# Patient Record
Sex: Female | Born: 1960 | Race: Black or African American | Hispanic: No | Marital: Single | Smoking: Never smoker
Health system: Southern US, Community
[De-identification: ages and names within clinical notes are randomized; demographics above are authoritative.]

## PROBLEM LIST (undated history)

## (undated) DIAGNOSIS — R0600 Dyspnea, unspecified: Secondary | ICD-10-CM

## (undated) DIAGNOSIS — F419 Anxiety disorder, unspecified: Secondary | ICD-10-CM

## (undated) DIAGNOSIS — R519 Headache, unspecified: Secondary | ICD-10-CM

## (undated) DIAGNOSIS — E0590 Thyrotoxicosis, unspecified without thyrotoxic crisis or storm: Secondary | ICD-10-CM

## (undated) DIAGNOSIS — Z973 Presence of spectacles and contact lenses: Secondary | ICD-10-CM

## (undated) DIAGNOSIS — R112 Nausea with vomiting, unspecified: Secondary | ICD-10-CM

## (undated) DIAGNOSIS — K219 Gastro-esophageal reflux disease without esophagitis: Secondary | ICD-10-CM

## (undated) DIAGNOSIS — M1990 Unspecified osteoarthritis, unspecified site: Secondary | ICD-10-CM

## (undated) DIAGNOSIS — J189 Pneumonia, unspecified organism: Secondary | ICD-10-CM

## (undated) DIAGNOSIS — E119 Type 2 diabetes mellitus without complications: Secondary | ICD-10-CM

## (undated) DIAGNOSIS — F32A Depression, unspecified: Secondary | ICD-10-CM

## (undated) DIAGNOSIS — I499 Cardiac arrhythmia, unspecified: Secondary | ICD-10-CM

## (undated) DIAGNOSIS — N189 Chronic kidney disease, unspecified: Secondary | ICD-10-CM

## (undated) DIAGNOSIS — J302 Other seasonal allergic rhinitis: Secondary | ICD-10-CM

## (undated) DIAGNOSIS — F329 Major depressive disorder, single episode, unspecified: Secondary | ICD-10-CM

## (undated) DIAGNOSIS — Z9889 Other specified postprocedural states: Secondary | ICD-10-CM

## (undated) DIAGNOSIS — J40 Bronchitis, not specified as acute or chronic: Secondary | ICD-10-CM

## (undated) DIAGNOSIS — G8929 Other chronic pain: Secondary | ICD-10-CM

## (undated) DIAGNOSIS — Z972 Presence of dental prosthetic device (complete) (partial): Secondary | ICD-10-CM

## (undated) DIAGNOSIS — M797 Fibromyalgia: Secondary | ICD-10-CM

## (undated) DIAGNOSIS — E785 Hyperlipidemia, unspecified: Secondary | ICD-10-CM

## (undated) DIAGNOSIS — G4730 Sleep apnea, unspecified: Secondary | ICD-10-CM

## (undated) DIAGNOSIS — I10 Essential (primary) hypertension: Secondary | ICD-10-CM

## (undated) HISTORY — PX: WISDOM TOOTH EXTRACTION: SHX21

## (undated) HISTORY — PX: CHOLECYSTECTOMY: SHX55

## (undated) HISTORY — PX: UPPER GI ENDOSCOPY: SHX6162

## (undated) HISTORY — PX: TUBAL LIGATION: SHX77

## (undated) HISTORY — PX: ABDOMINAL HYSTERECTOMY: SHX81

## (undated) HISTORY — PX: COLONOSCOPY: SHX174

---

## 2009-06-10 ENCOUNTER — Emergency Department (HOSPITAL_BASED_OUTPATIENT_CLINIC_OR_DEPARTMENT_OTHER): Admission: EM | Admit: 2009-06-10 | Discharge: 2009-06-10 | Payer: Self-pay | Admitting: Emergency Medicine

## 2009-06-10 ENCOUNTER — Ambulatory Visit: Payer: Self-pay | Admitting: Interventional Radiology

## 2009-06-10 IMAGING — CR DG CLAVICLE*L*
2 series · 2 of 2 positions shown · non-contrast
Comparison: None.

CLINICAL DATA: Pain post fall

LEFT CLAVICLE - 2+ VIEWS

[w clavicle ap left *]
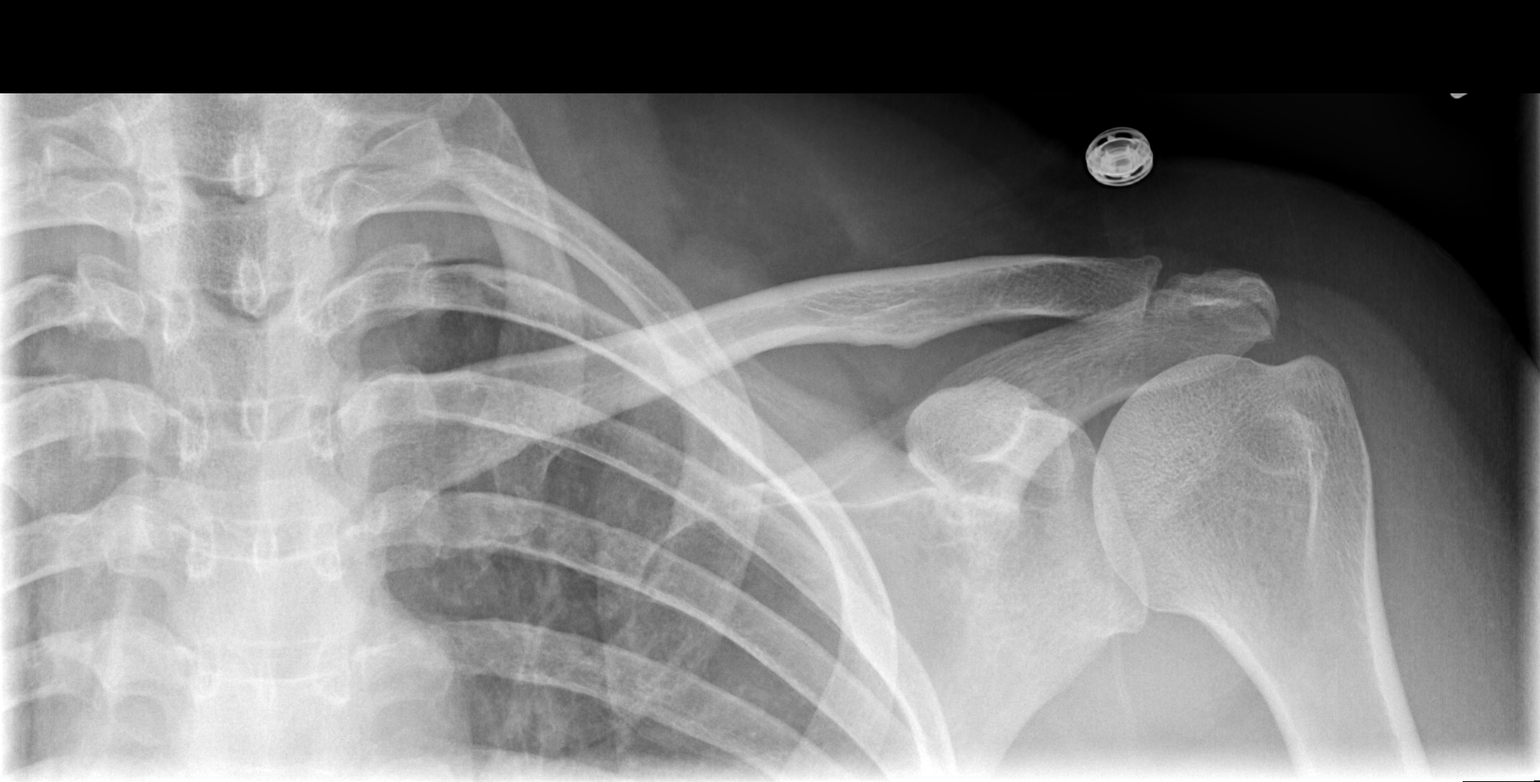

[w clavicle tangential left *]
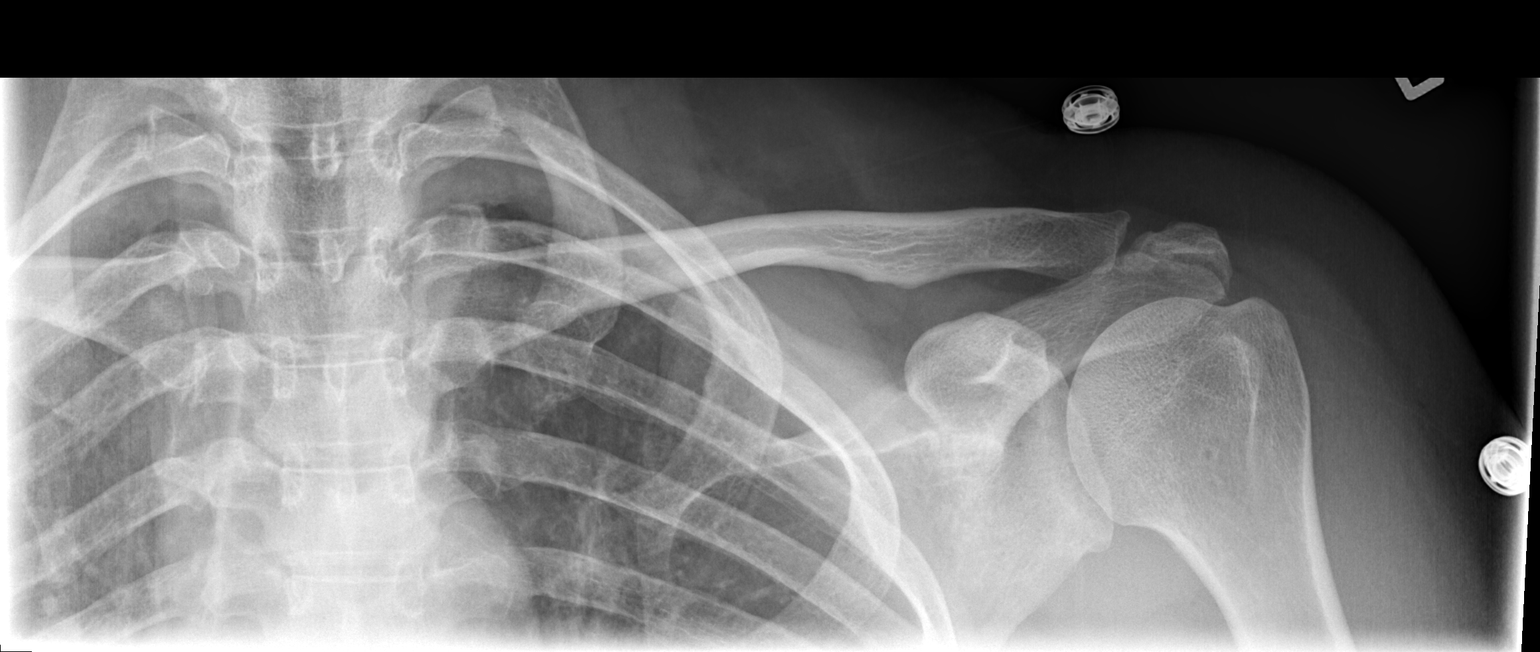

[2 of 2 positions shown; findings below may reference images not displayed]

FINDINGS: There is a linear lucency across the acromion in the
axial plane,   approximately 1 mm in width.  Clavicle is intact.
Glenohumeral articulation appears normal.  The acromioclavicular
space is normal.
IMPRESSION: 1.  Minimally-displaced fracture of the acromion versus ununited
apophysis.  Correlate with point tenderness.

## 2010-01-05 ENCOUNTER — Emergency Department (HOSPITAL_BASED_OUTPATIENT_CLINIC_OR_DEPARTMENT_OTHER)
Admission: EM | Admit: 2010-01-05 | Discharge: 2010-01-05 | Payer: Self-pay | Source: Home / Self Care | Admitting: Emergency Medicine

## 2010-04-01 LAB — WET PREP, GENITAL
Trich, Wet Prep: NONE SEEN
Yeast Wet Prep HPF POC: NONE SEEN

## 2010-04-01 LAB — URINALYSIS, ROUTINE W REFLEX MICROSCOPIC
Ketones, ur: NEGATIVE mg/dL
Nitrite: NEGATIVE
Specific Gravity, Urine: 1.042 — ABNORMAL HIGH (ref 1.005–1.030)

## 2010-04-01 LAB — GC/CHLAMYDIA PROBE AMP, GENITAL
Chlamydia, DNA Probe: NEGATIVE
GC Probe Amp, Genital: NEGATIVE

## 2011-01-21 HISTORY — PX: ANTERIOR (CYSTOCELE) AND POSTERIOR REPAIR (RECTOCELE) WITH XENFORM GRAFT AND SACROSPINOUS FIXATION: SHX6492

## 2011-03-24 ENCOUNTER — Encounter: Payer: Self-pay | Admitting: Family Medicine

## 2012-01-21 DIAGNOSIS — K759 Inflammatory liver disease, unspecified: Secondary | ICD-10-CM

## 2012-01-21 HISTORY — DX: Inflammatory liver disease, unspecified: K75.9

## 2016-01-21 HISTORY — PX: OTHER SURGICAL HISTORY: SHX169

## 2017-10-21 ENCOUNTER — Emergency Department (HOSPITAL_BASED_OUTPATIENT_CLINIC_OR_DEPARTMENT_OTHER): Payer: Medicare Other

## 2017-10-21 ENCOUNTER — Encounter: Payer: Self-pay | Admitting: Emergency Medicine

## 2017-10-21 ENCOUNTER — Other Ambulatory Visit: Payer: Self-pay

## 2017-10-21 ENCOUNTER — Emergency Department (HOSPITAL_BASED_OUTPATIENT_CLINIC_OR_DEPARTMENT_OTHER)
Admission: EM | Admit: 2017-10-21 | Discharge: 2017-10-21 | Disposition: A | Discharge status: Home / Self Care | Payer: Medicare Other | Attending: Emergency Medicine | Admitting: Emergency Medicine

## 2017-10-21 DIAGNOSIS — Z79899 Other long term (current) drug therapy: Secondary | ICD-10-CM | POA: Insufficient documentation

## 2017-10-21 DIAGNOSIS — E119 Type 2 diabetes mellitus without complications: Secondary | ICD-10-CM | POA: Insufficient documentation

## 2017-10-21 DIAGNOSIS — M79605 Pain in left leg: Secondary | ICD-10-CM | POA: Diagnosis not present

## 2017-10-21 DIAGNOSIS — I10 Essential (primary) hypertension: Secondary | ICD-10-CM | POA: Insufficient documentation

## 2017-10-21 HISTORY — DX: Depression, unspecified: F32.A

## 2017-10-21 HISTORY — DX: Type 2 diabetes mellitus without complications: E11.9

## 2017-10-21 HISTORY — DX: Essential (primary) hypertension: I10

## 2017-10-21 HISTORY — DX: Major depressive disorder, single episode, unspecified: F32.9

## 2017-10-21 IMAGING — DX DG HIP (WITH OR WITHOUT PELVIS) 2-3V*L*
3 series · 3 of 3 positions shown · non-contrast
Comparison: None.

CLINICAL DATA: Left hip pain, twisting injury, difficulty bearing
weight

EXAM:
DG HIP (WITH OR WITHOUT PELVIS) 2-3V LEFT

[pelvis ap]
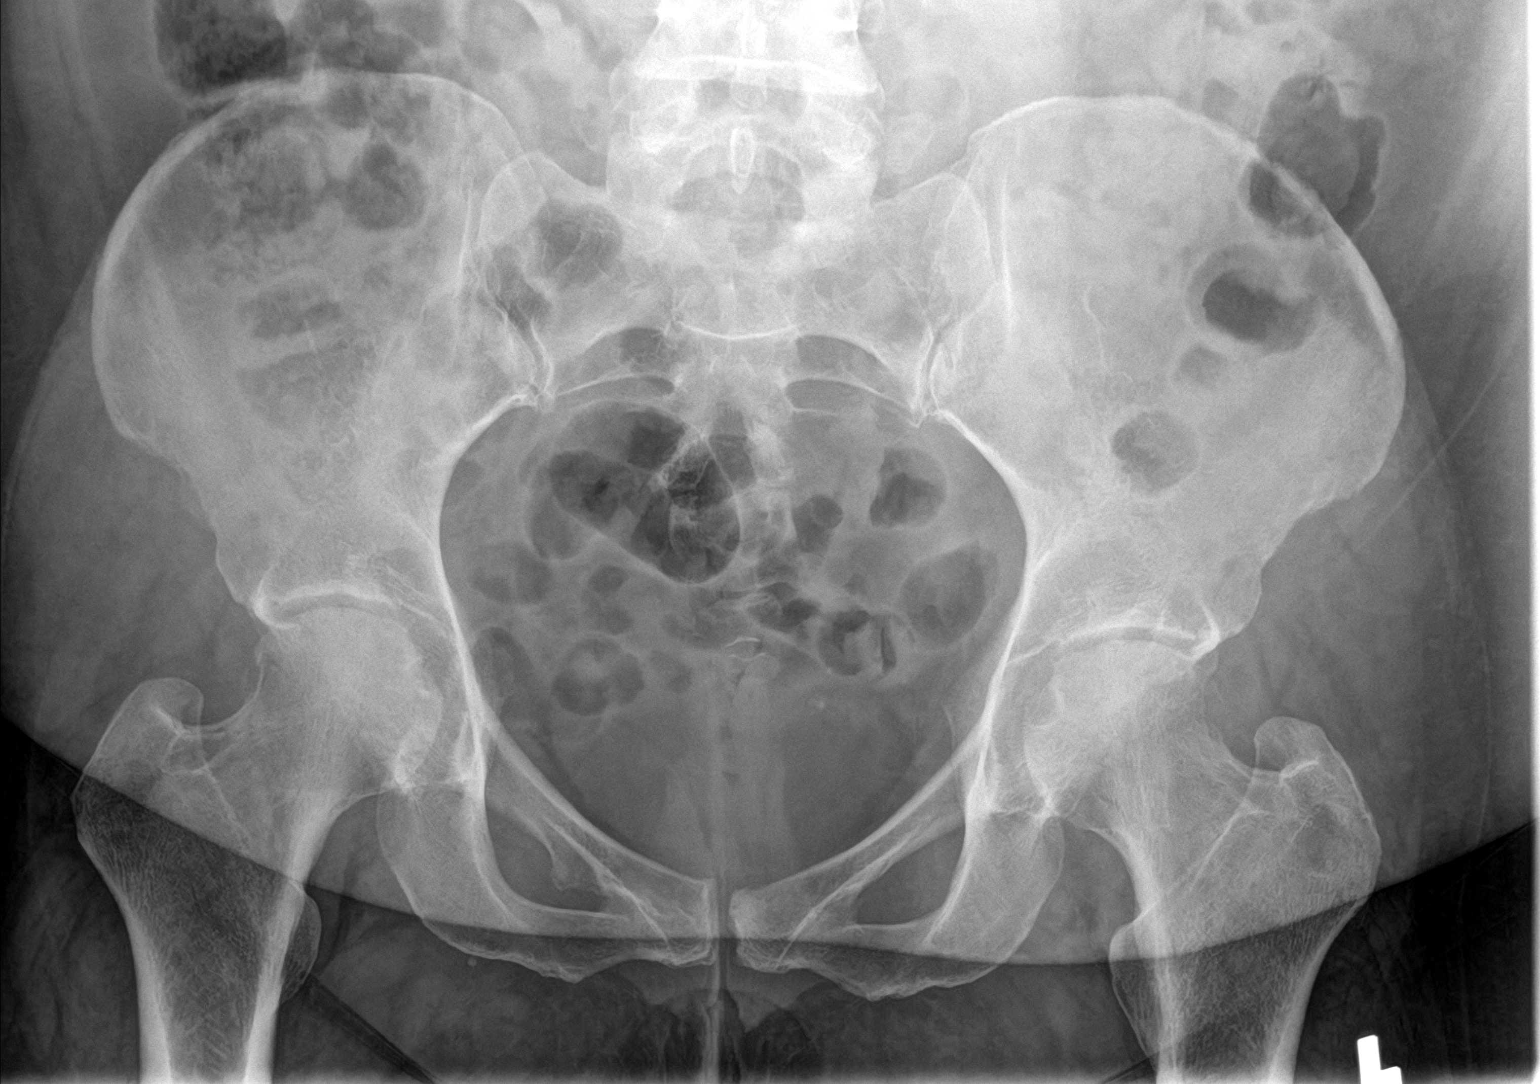

[hip ap]
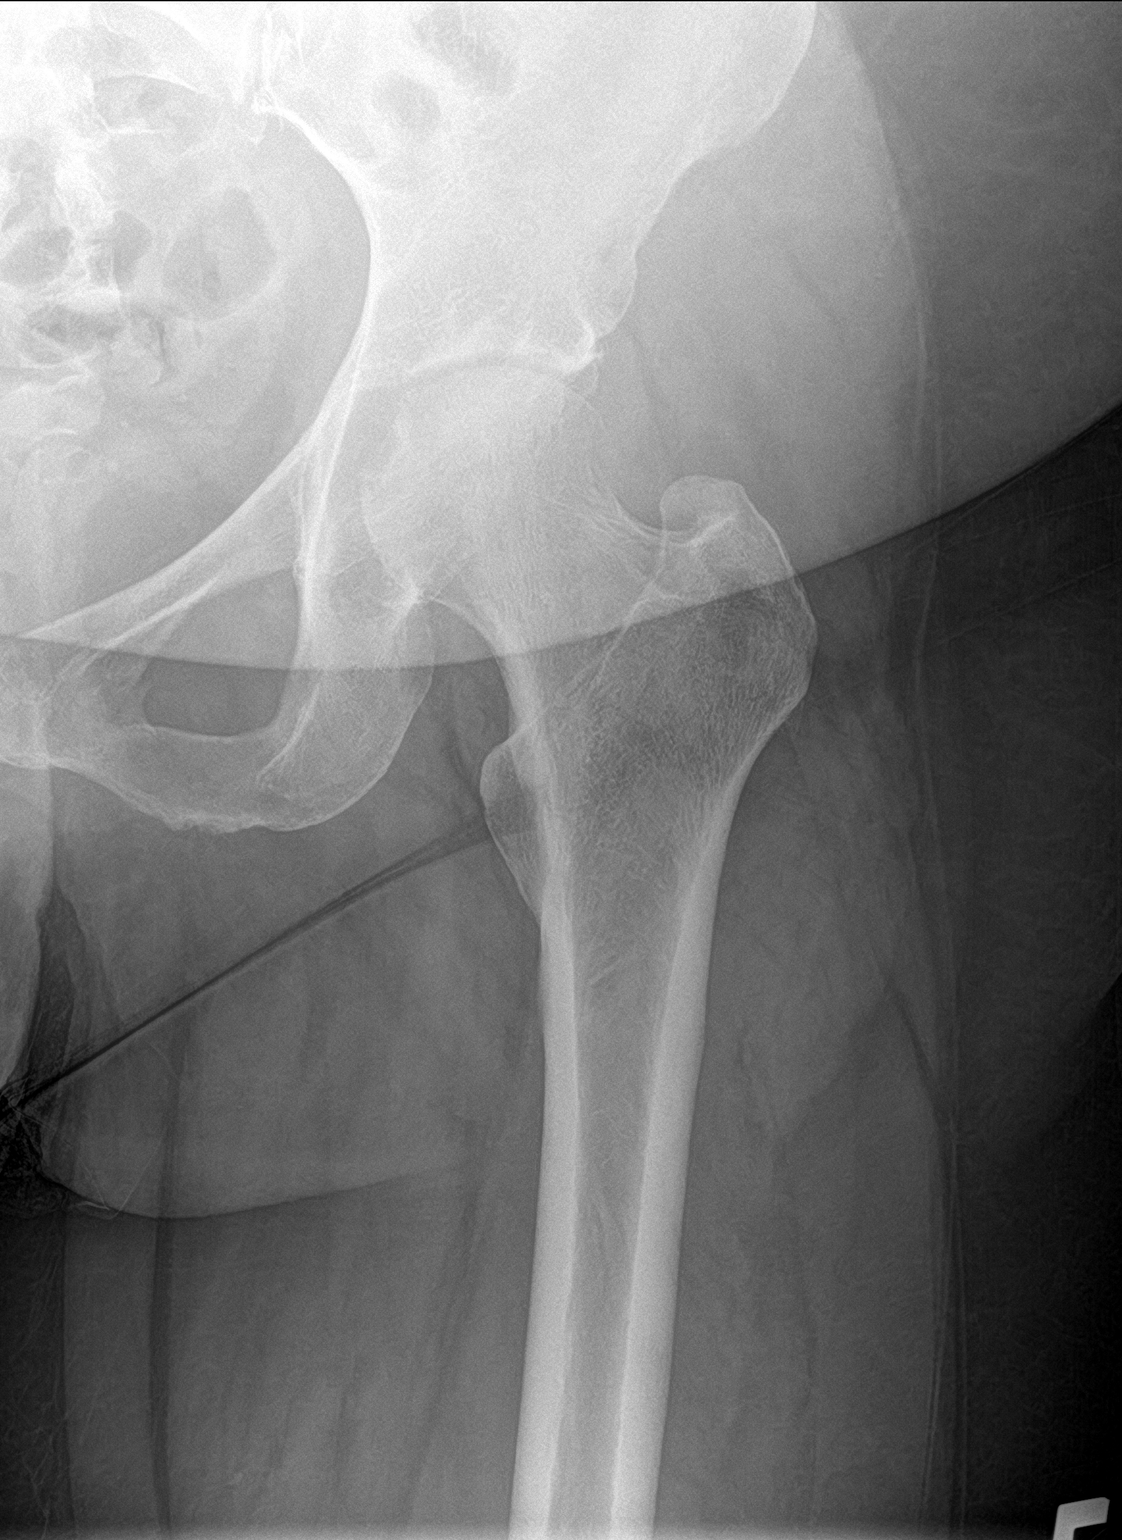

[hip frog leg]
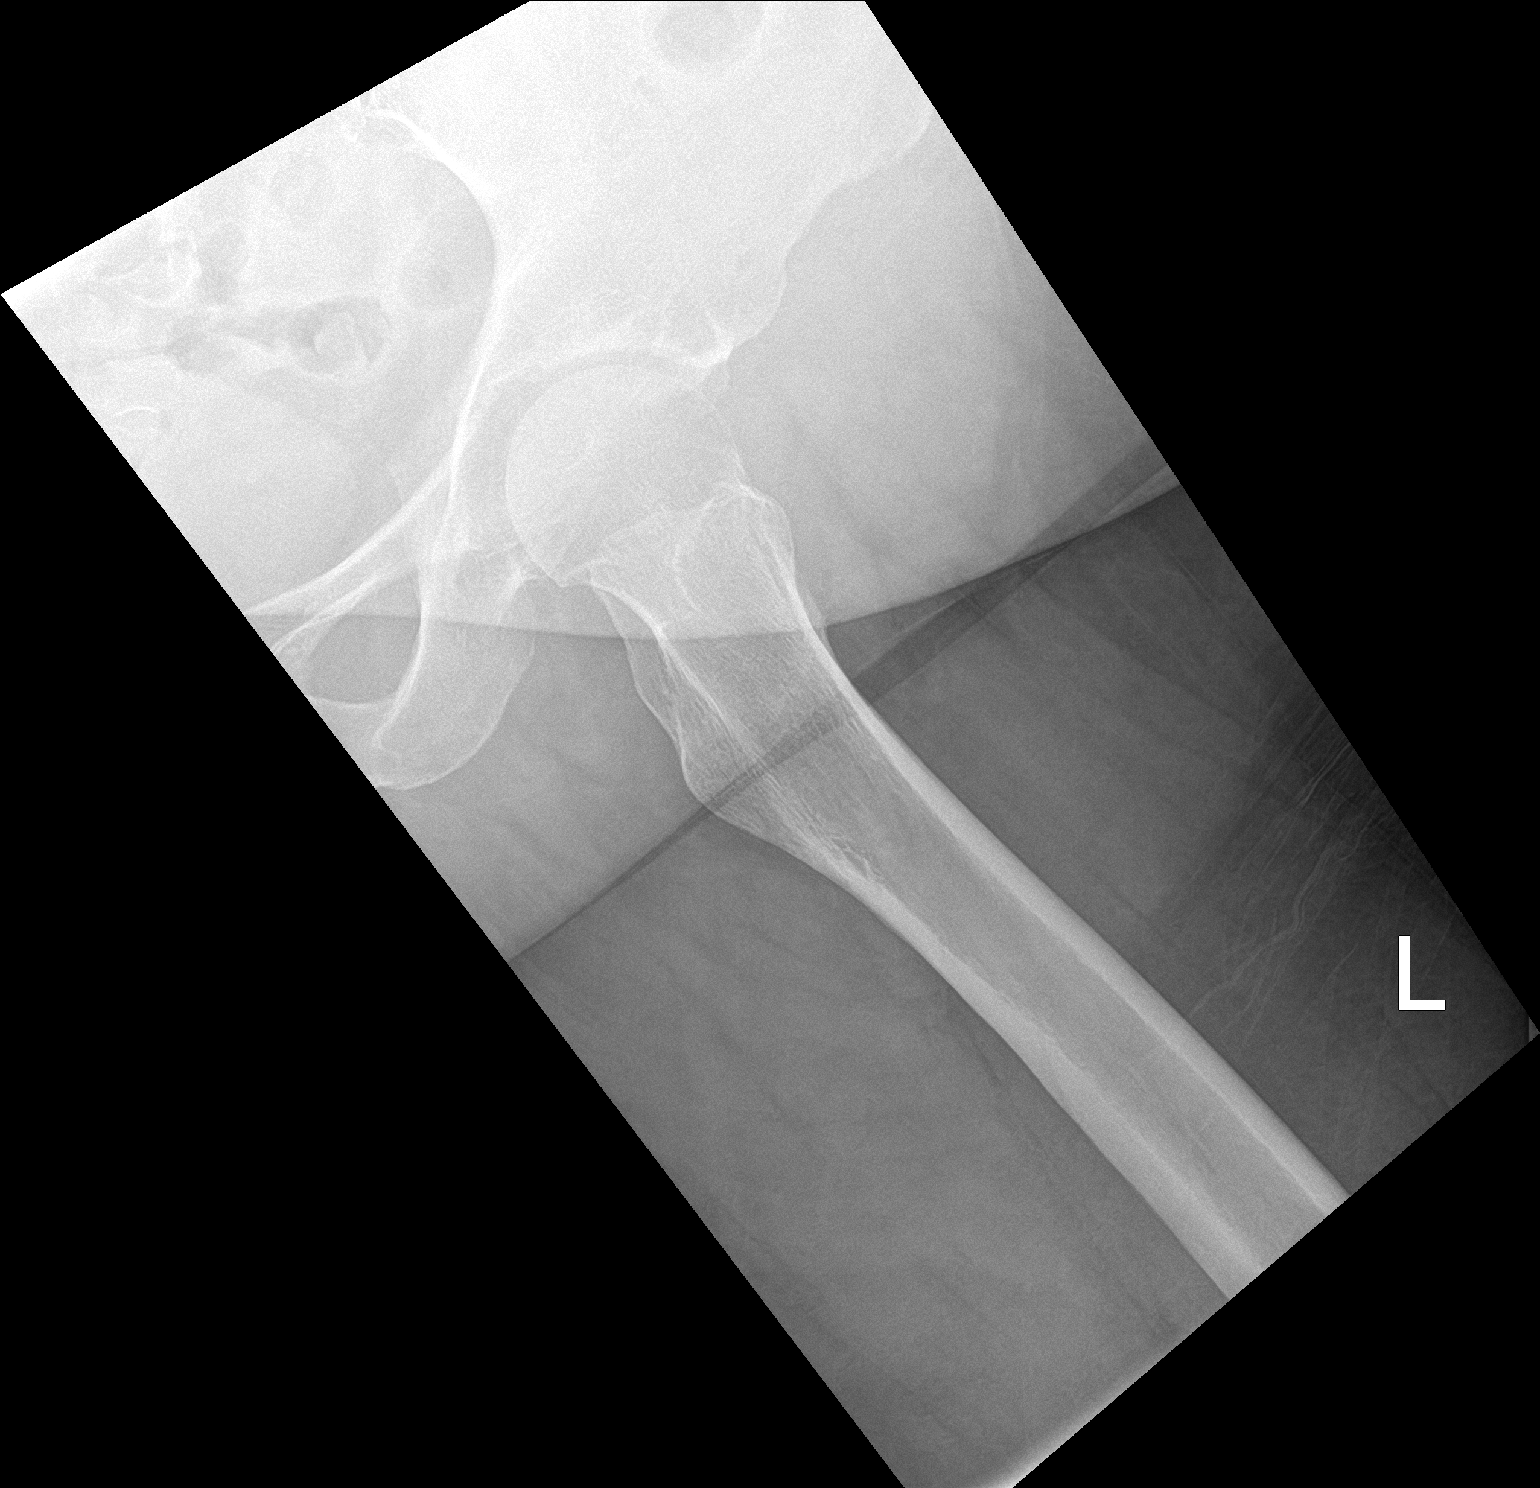

[3 of 3 positions shown; findings below may reference images not displayed]

FINDINGS: There is no evidence of hip fracture or dislocation. There is no
evidence of arthropathy or other focal bone abnormality.
IMPRESSION: No acute osseous finding or malalignment

## 2017-10-21 IMAGING — DX DG KNEE COMPLETE 4+V*L*
4 series · 4 of 4 positions shown · non-contrast
Comparison: [DATE]

CLINICAL DATA: Left knee pain, swelling, twisting injury

EXAM:
LEFT KNEE - COMPLETE 4+ VIEW

[knee ap]
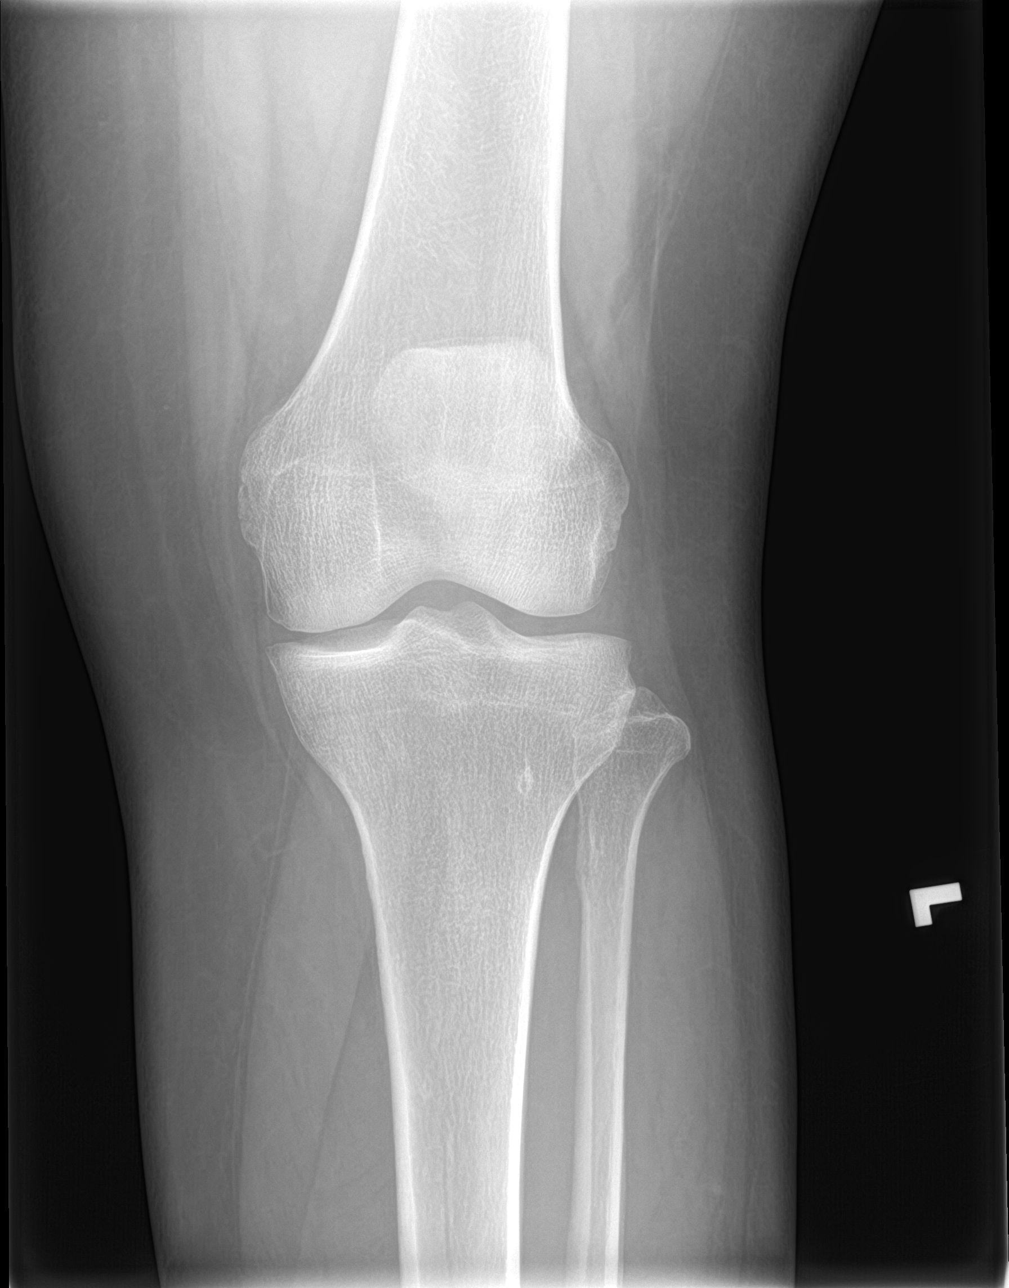

[knee lat]
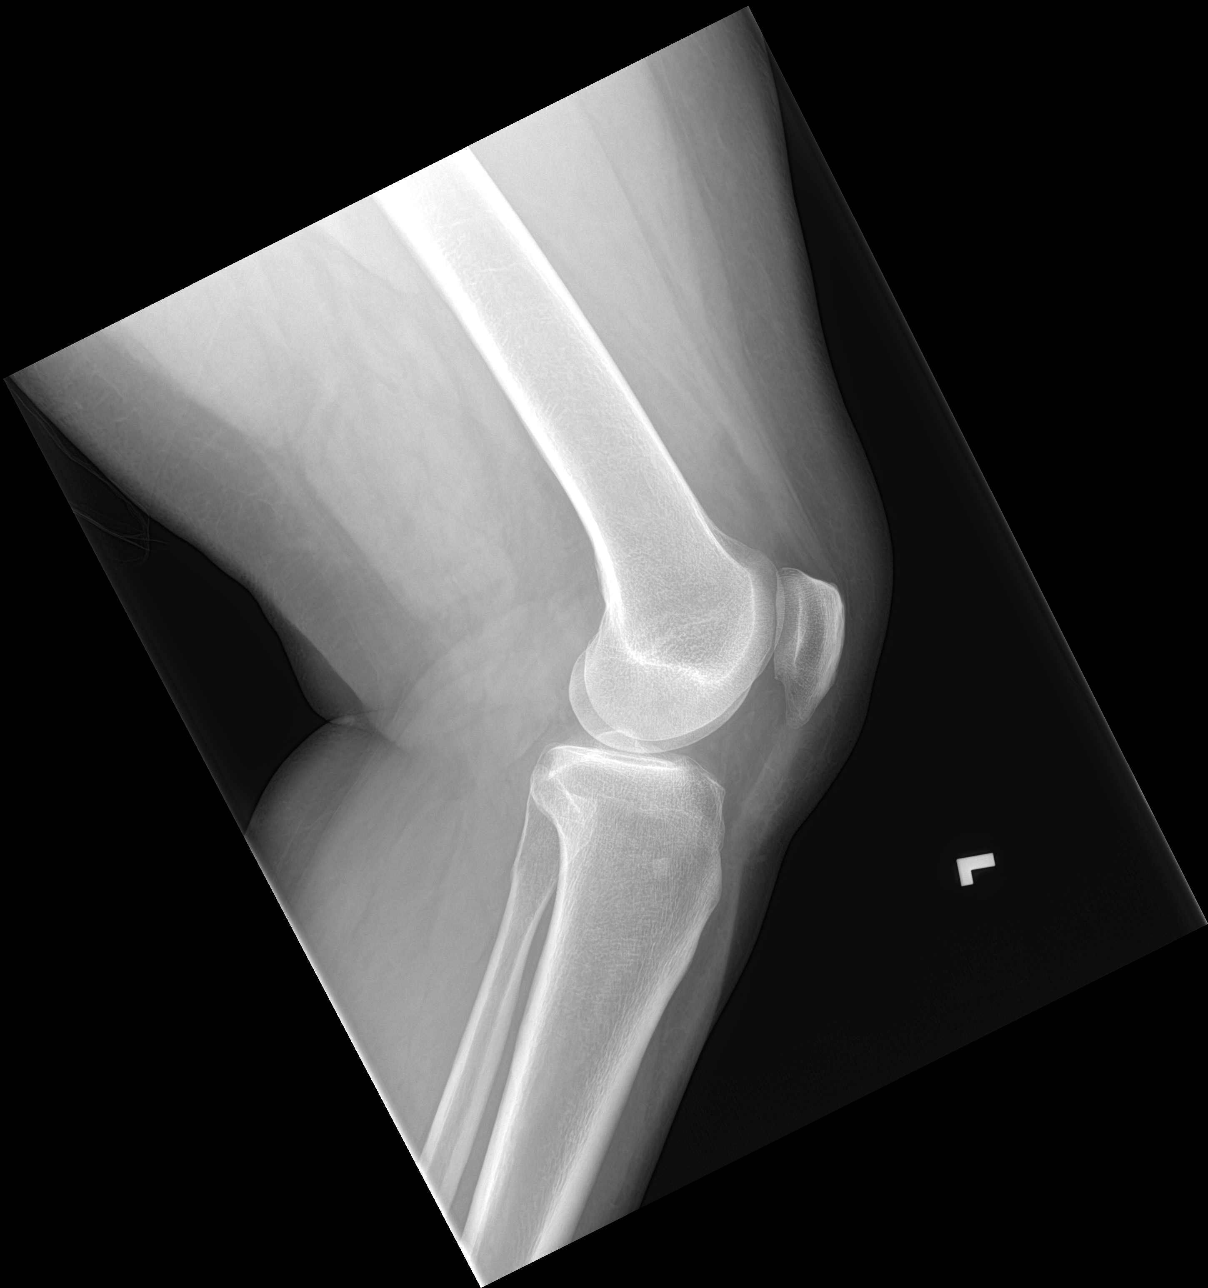

[knee obl (1 of 2)]
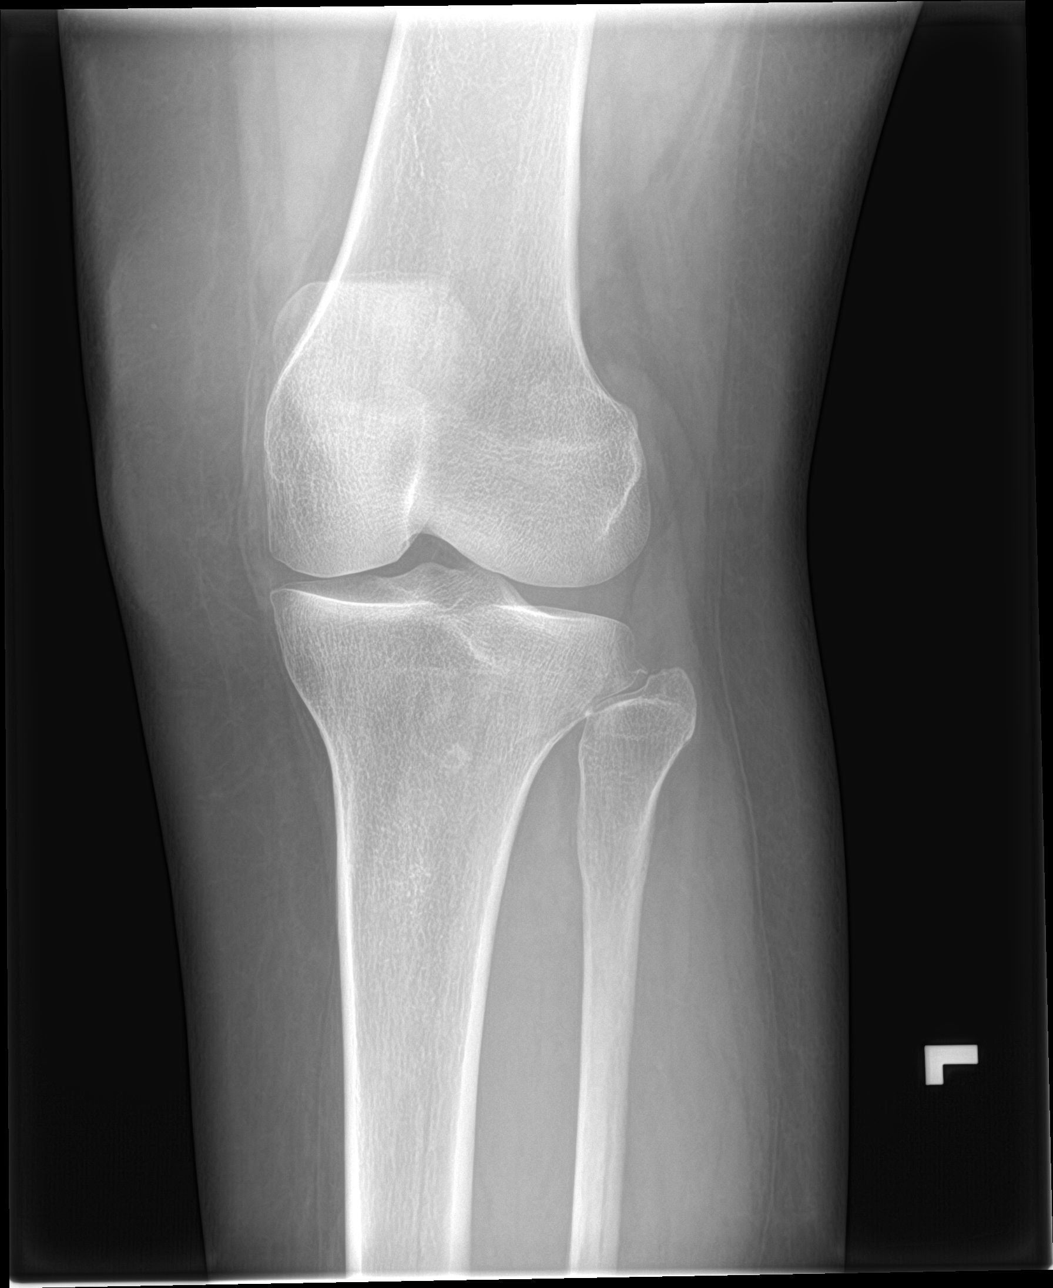

[knee obl (2 of 2)]
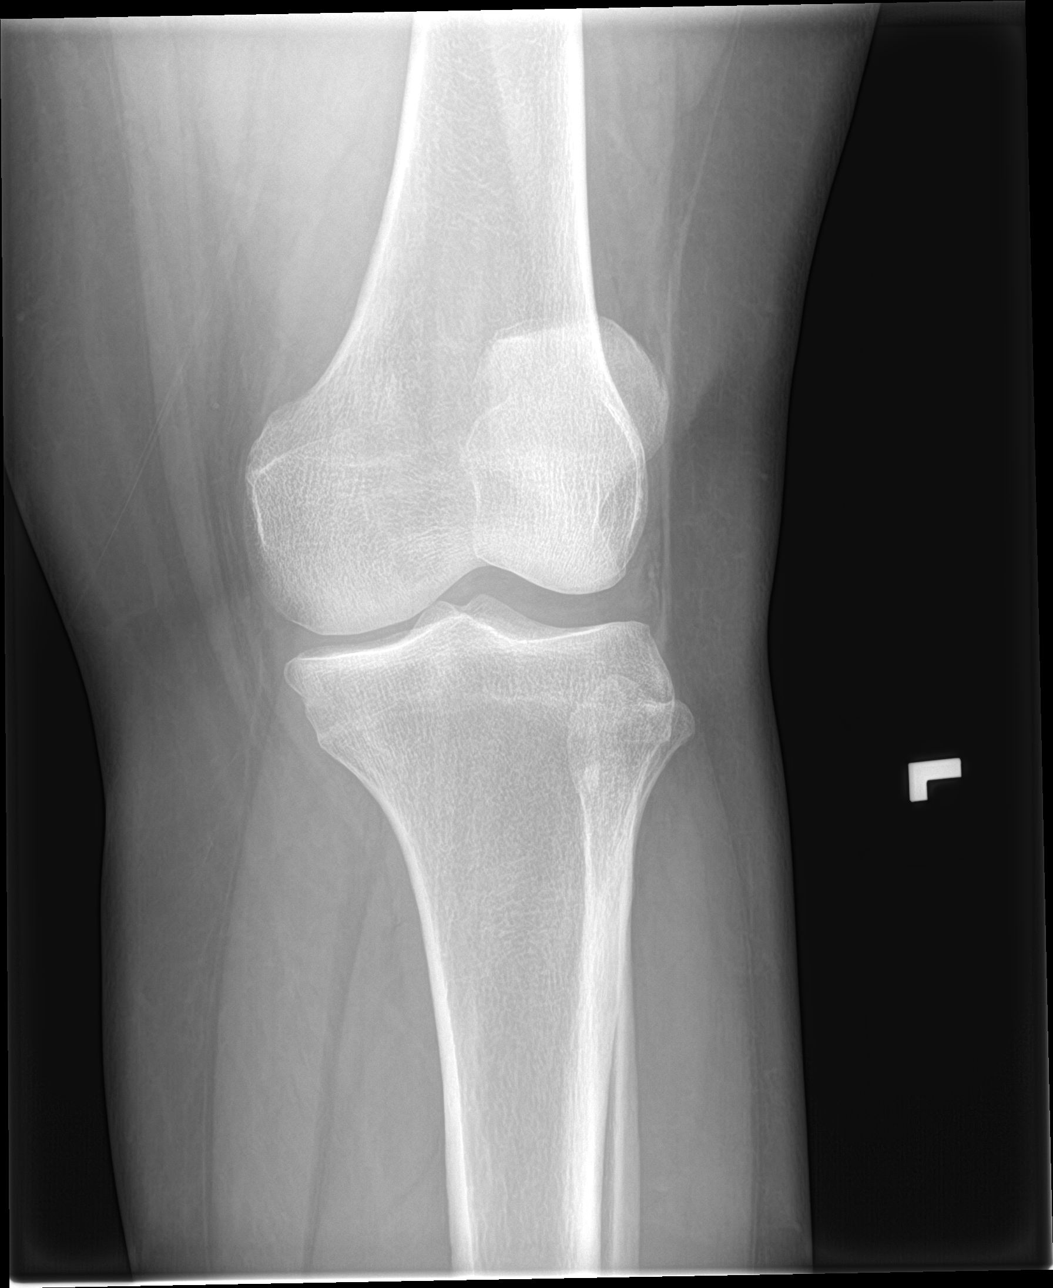

[4 of 4 positions shown; findings below may reference images not displayed]

FINDINGS: No evidence of fracture, dislocation, or joint effusion. No evidence
of arthropathy or other focal bone abnormality. Soft tissues are
unremarkable.
IMPRESSION: Negative.

## 2017-10-21 MED ORDER — OXYCODONE-ACETAMINOPHEN 5-325 MG PO TABS
1.0000 | ORAL_TABLET | Freq: Once | ORAL | Status: AC
  Administered 2017-10-21: 1 via ORAL
  Filled 2017-10-21: qty 1

## 2017-10-21 MED ORDER — TRAMADOL HCL 50 MG PO TABS
50.0000 mg | ORAL_TABLET | Freq: Four times a day (QID) | ORAL | 0 refills | Status: DC | PRN
Start: 2017-10-21 — End: 2020-03-21

## 2017-10-21 MED ORDER — IBUPROFEN 400 MG PO TABS
400.0000 mg | ORAL_TABLET | Freq: Once | ORAL | Status: AC
  Administered 2017-10-21: 400 mg via ORAL
  Filled 2017-10-21: qty 1

## 2017-10-21 NOTE — ED Notes (Signed)
Percocet held d/t no ride

## 2017-10-21 NOTE — ED Triage Notes (Signed)
Pt states walking [place] a trail and stepped off the sidewalk wrong and lt leg went out side ways; pain to lt knee and upper thigh

## 2017-10-21 NOTE — Discharge Instructions (Addendum)
I think this is a muscular strain. Your x-rays look fine. Take prescribed medication as needed. Follow-up with your PCP if you're still having significant symptoms after 4-5 days.

## 2017-10-30 NOTE — ED Provider Notes (Signed)
[facility] EMERGENCY DEPARTMENT Provider Note   CSN: [id number] Arrival date & time: 10/21/17  1839     History   Chief Complaint Chief Complaint  Patient presents with  . Leg Pain    HPI Tracy Daugherty is a 57 y.o. female.  HPI   57 year old female with left leg pain.  She was walking when she stepped awkwardly.  She has been having pain in her left knee and upper thigh since then.  She can bear weight although with increased pain.  Denies any other acute injuries.  Past Medical History:  Diagnosis Date  . Depression   . Diabetes mellitus without complication (HCC)   . Hypertension     There are no active problems to display for this patient.   Past Surgical History:  Procedure Laterality Date  . ABDOMINAL HYSTERECTOMY    . CHOLECYSTECTOMY       OB History   None      Home Medications    Prior to Admission medications   Medication Sig Start Date End Date Taking? Authorizing Provider  Famotidine (PEPCID PO) Take by mouth.   Yes [provider]  furosemide (LASIX) 40 MG tablet Take 40 mg by mouth.   Yes [provider]  lisinopril (PRINIVIL,ZESTRIL) 10 MG tablet Take 10 mg by mouth daily.   Yes [provider]  nebivolol (BYSTOLIC) 5 MG tablet Take 5 mg by mouth daily.   Yes [provider]  omeprazole (PRILOSEC) 20 MG capsule Take 20 mg by mouth daily.   Yes [provider]  ropinirole (REQUIP) 5 MG tablet Take 5 mg by mouth at bedtime.   Yes [provider]  sertraline (ZOLOFT) 100 MG tablet Take 100 mg by mouth daily.   Yes [provider]  traMADol (ULTRAM) 50 MG tablet Take 1 tablet (50 mg total) by mouth every 6 (six) hours as needed. 10/21/17   Raeford Razor, MD    Family History No family history on file.  Social History Social History   Tobacco Use  . Smoking status: Never Smoker  . Smokeless tobacco: Never Used  Substance Use Topics  . Alcohol use: Not Currently    . Drug use: Not Currently     Allergies   Meloxicam   Review of Systems Review of Systems  All systems reviewed and negative, other than as noted in HPI.  Physical Exam Updated Vital Signs BP 125/70 (BP Location: Right Arm)   Pulse 84   Temp 98.4 F (36.9 C) (Oral)   Resp 18   SpO2 98%   Physical Exam  Constitutional: She appears well-developed and well-nourished. No distress.  HENT:  Head: Normocephalic and atraumatic.  Eyes: Conjunctivae are normal. Right eye exhibits no discharge. Left eye exhibits no discharge.  Neck: Neck supple.  Cardiovascular: Normal rate, regular rhythm and normal heart sounds. Exam reveals no gallop and no friction rub.  No murmur heard. Pulmonary/Chest: Effort normal and breath sounds normal. No respiratory distress.  Abdominal: Soft. She exhibits no distension. There is no tenderness.  Musculoskeletal: She exhibits no edema or tenderness.  No midline spinal tenderness.  No apparent pain on range of motion of the left hip.  She has tenderness to palpation in the anterior proximal and mid thigh.  There is no swelling, bruising or other concerning skin changes.  Tenderness to palpation right over the left patella and laterally along the knee.  No ligamentous laxity appreciated.  No effusion.  Neurovascular intact distally.  Neurological: She is alert.  Skin: Skin is warm and dry.  Psychiatric: She has a normal mood and affect. Her behavior is normal. Thought content normal.  Nursing note and vitals reviewed.    ED Treatments / Results  Labs (all labs ordered are listed, but only abnormal results are displayed) Labs Reviewed - No data to display  EKG None  Radiology No results found.   Dg Knee Complete 4 Views Left  Result Date: 10/21/2017 CLINICAL DATA:  Left knee pain, swelling, twisting injury EXAM: LEFT KNEE - COMPLETE 4+ VIEW COMPARISON:  10/21/2017 FINDINGS: No evidence of fracture, dislocation, or joint effusion. No evidence of  arthropathy or other focal bone abnormality. Soft tissues are unremarkable. IMPRESSION: Negative. Electronically Signed   By: Judie Petit.  Shick M.D.   On: 10/21/2017 21:28   Dg Hip Unilat W Or Wo Pelvis 2-3 Views Left  Result Date: 10/21/2017 CLINICAL DATA:  Left hip pain, twisting injury, difficulty bearing weight EXAM: DG HIP (WITH OR WITHOUT PELVIS) 2-3V LEFT COMPARISON:  None. FINDINGS: There is no evidence of hip fracture or dislocation. There is no evidence of arthropathy or other focal bone abnormality. IMPRESSION: No acute osseous finding or malalignment Electronically Signed   By: Judie Petit.  Shick M.D.   On: 10/21/2017 21:27    Procedures Procedures (including critical care time)  Medications Ordered in ED Medications  ibuprofen (ADVIL,MOTRIN) tablet 400 mg (400 mg Oral Given 10/21/17 2032)  oxyCODONE-acetaminophen (PERCOCET/ROXICET) 5-325 MG per tablet 1 tablet (1 tablet Oral Given 10/21/17 2150)     Initial Impression / Assessment and Plan / ED Course  I have reviewed the triage vital signs and the nursing notes.  Pertinent labs & imaging results that were available during my care of the patient were reviewed by me and considered in my medical decision making (see chart for details).     Negative imaging. NVI.  Rice therapy.  Activity as tolerated.  Orthopedic or family doctor follow-up for persistent symptoms.  Final Clinical Impressions(s) / ED Diagnoses   Final diagnoses:  Left leg pain    ED Discharge Orders         Ordered    traMADol (ULTRAM) 50 MG tablet  Every 6 hours PRN     10/21/17 2149           Raeford Razor, MD 10/30/17 2353

## 2018-10-14 ENCOUNTER — Telehealth: Payer: Medicare Other | Admitting: Physician Assistant

## 2018-10-14 DIAGNOSIS — R053 Chronic cough: Secondary | ICD-10-CM

## 2018-10-14 DIAGNOSIS — R05 Cough: Secondary | ICD-10-CM

## 2018-10-14 NOTE — Progress Notes (Signed)
Based on what you shared with me, I feel your condition warrants further evaluation and I recommend that you be seen for a face to face office visit.  Giving symptoms have been present for a month, unlikely to be active COVID infection. If you were recently exposed to someone with COVID doubtful current ongoing symptoms are related but would be reasonable to go get tested. You can go to [email or web address] to find local testing sites. Otherwise I would really recommend you be seen in person for a good lung examination so that appropriate treatment can be given.   NOTE: If you entered your credit card information for this eVisit, you will not be charged. You may see a "hold" on your card for the $35 but that hold will drop off and you will not have a charge processed.  If you are having a true medical emergency please call 911.     For an urgent face to face visit, [place] has four urgent care centers for your convenience:   . [facility]    [phone]                  Get Driving Directions  [address], [place] [address] . 10 am to 8 pm Monday-Friday . 12 pm to 8 pm Saturday-Sunday   . [facility] [place]                  Get Driving Directions  [address], [place], [place] [address] . 8 am to 8 pm Monday-Friday . 9 am to 6 pm Saturday . 11 am to 6 pm Sunday   . [facility] [place]                  Get Driving Directions   [address], [place] [address] . 8 am to 8 pm Monday-Friday . 8 am to 4 pm Saturday-Sunday    . [facility] at [place]                    Get Driving Directions  [phone]  [address] [place], [place] [address]  . Monday-Friday, 12 PM to 6 PM    Your e-visit answers were reviewed by a board certified advanced clinical practitioner to complete your  personal care plan.  Thank you for using e-Visits.

## 2019-09-09 ENCOUNTER — Other Ambulatory Visit: Payer: Self-pay | Admitting: Neurosurgery

## 2019-09-09 DIAGNOSIS — M5416 Radiculopathy, lumbar region: Secondary | ICD-10-CM

## 2019-10-02 ENCOUNTER — Ambulatory Visit
Admission: RE | Admit: 2019-10-02 | Discharge: 2019-10-02 | Disposition: A | Discharge status: Home / Self Care | Payer: Medicare Other | Source: Ambulatory Visit | Attending: Neurosurgery | Admitting: Neurosurgery

## 2019-10-02 DIAGNOSIS — M5416 Radiculopathy, lumbar region: Secondary | ICD-10-CM

## 2019-10-02 IMAGING — MR MR LUMBAR SPINE W/O CM
4 of 5 series · 18 of 48 positions shown · non-contrast
Comparison: Lumbar spine MRI [DATE].

CLINICAL DATA: Lumbar radiculopathy. Additional history provided by
scanning [HOSPITAL]month of centralized low back pain centered
over tailbone. Fall off porch in [[place]]. Right leg weakness, numbness
in toes.

EXAM:
MRI LUMBAR SPINE WITHOUT CONTRAST
TECHNIQUE: Multiplanar, multisequence MR imaging of the lumbar spine was
performed. No intravenous contrast was administered.

[Series 6: T2 · sagittal · 4.0mm · 0.73mm/px · 6 of 16 slices shown (1 of 2)]
[im 1/16]
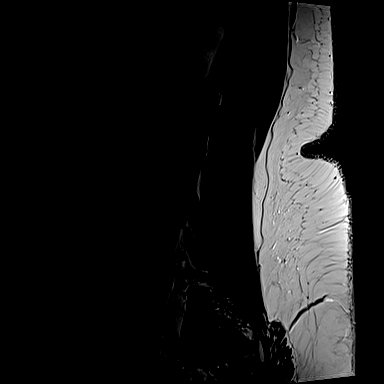
[im 4/16]
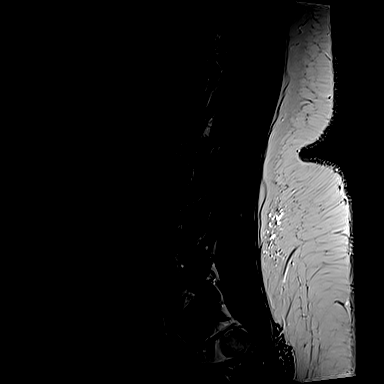
[im 7/16]
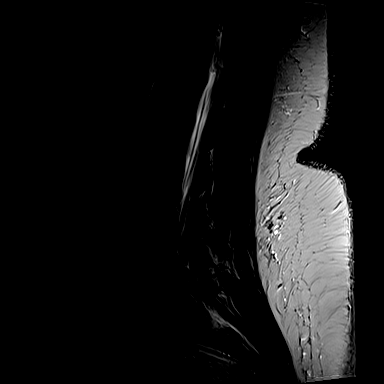
[im 10/16]
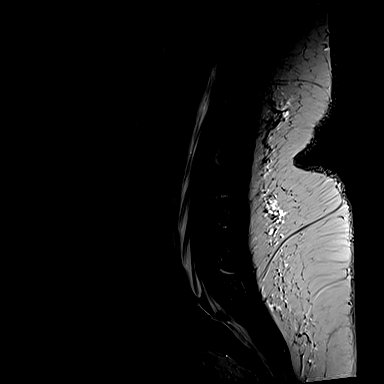
[im 13/16]
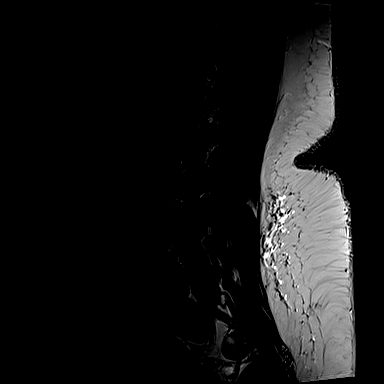
[im 16/16]
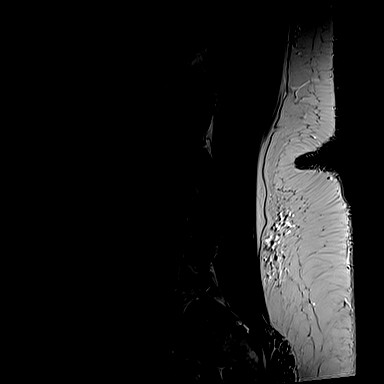

[Series 7: T1 · sagittal · 4.0mm · 0.88mm/px · 3 of 16 slices shown (1 of 2)]
[im 4/16]
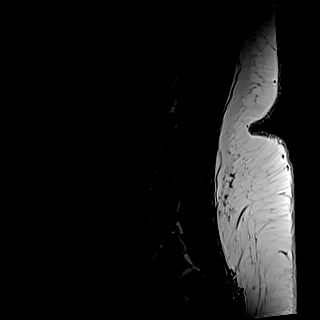
[im 10/16]
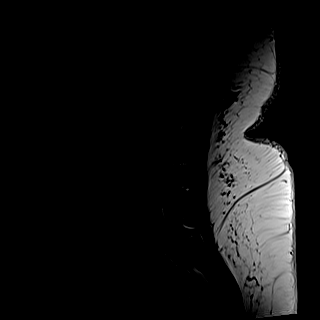
[im 16/16]
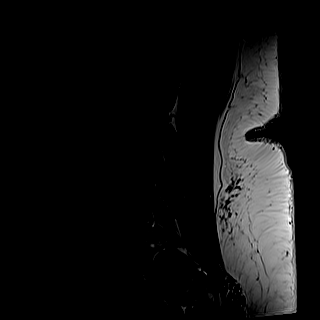

[Series 11: T1 · axial · 4.0mm · 0.28mm/px · z∈[-28,+148]mm · 3 of 43 slices shown (2 of 2)]
[im 7/43]
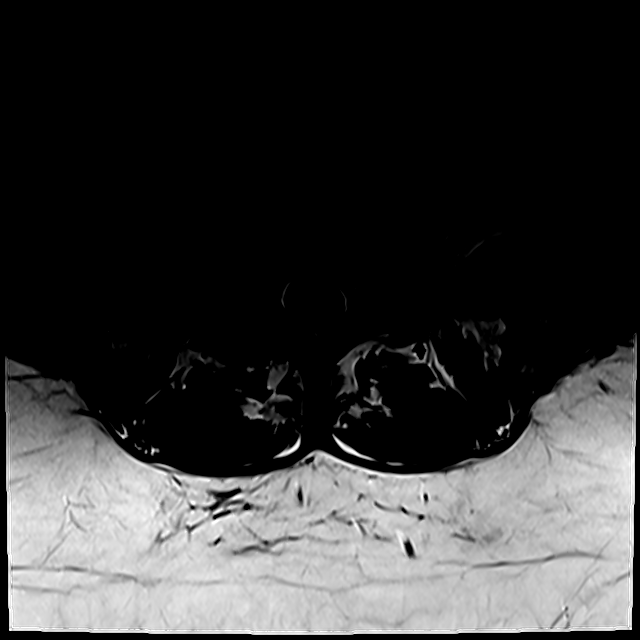
[im 22/43]
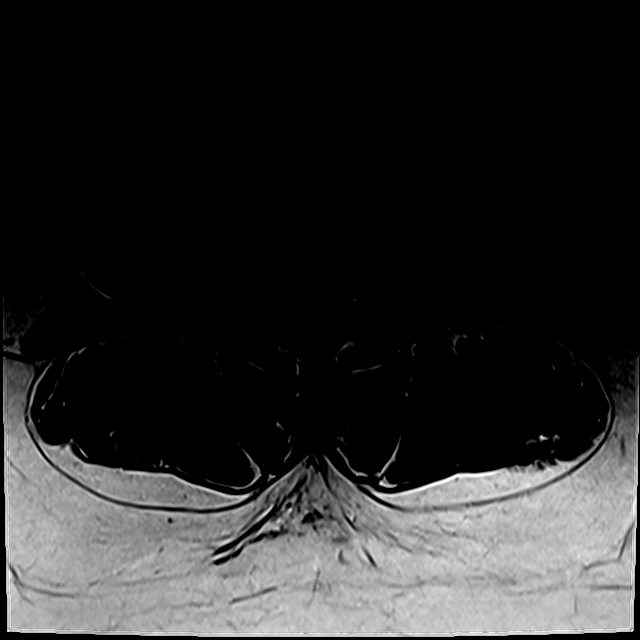
[im 37/43]
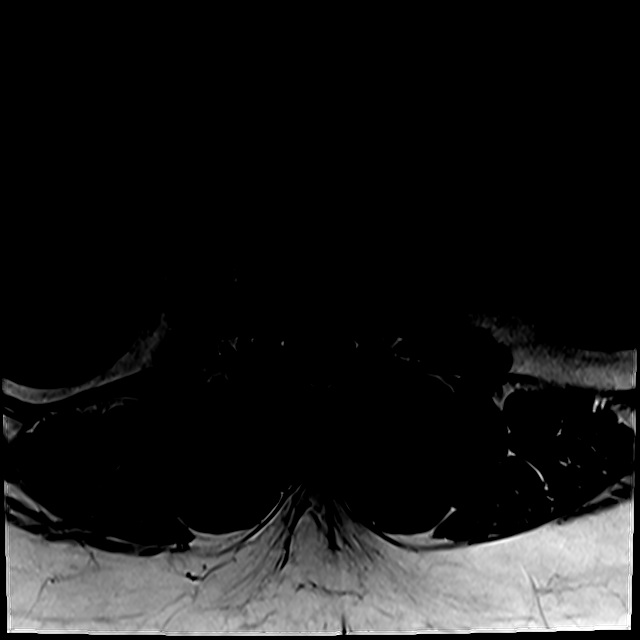

[Series 14: T2 · axial · 4.0mm · 0.28mm/px · z∈[-56,+148]mm · 6 of 43 slices shown (2 of 2)]
[im 1/43]
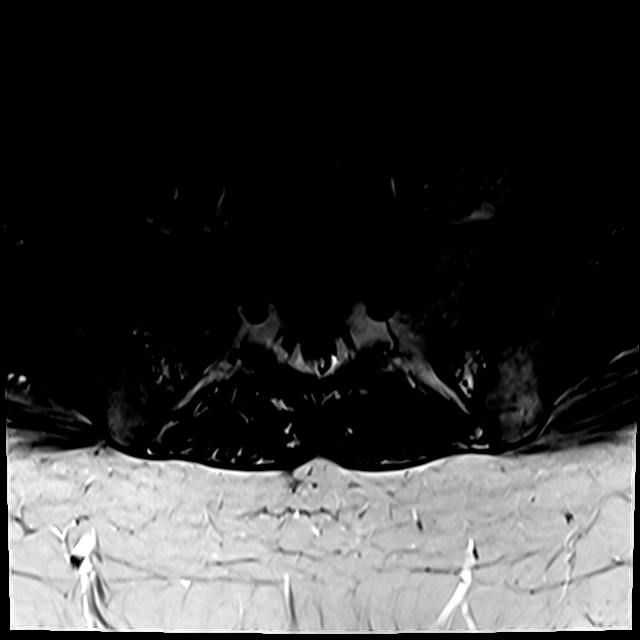
[im 7/43]
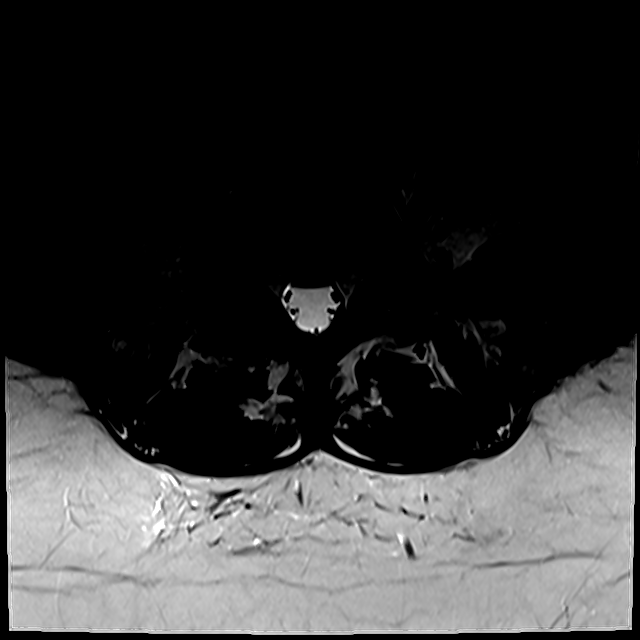
[im 13/43]
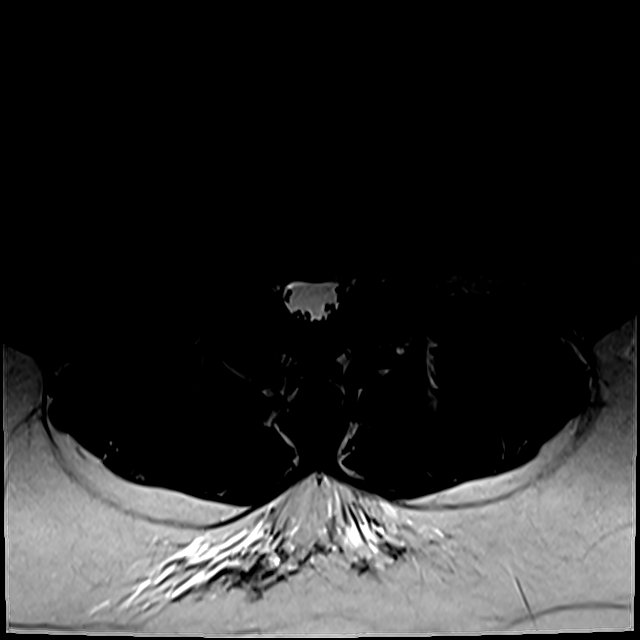
[im 19/43]
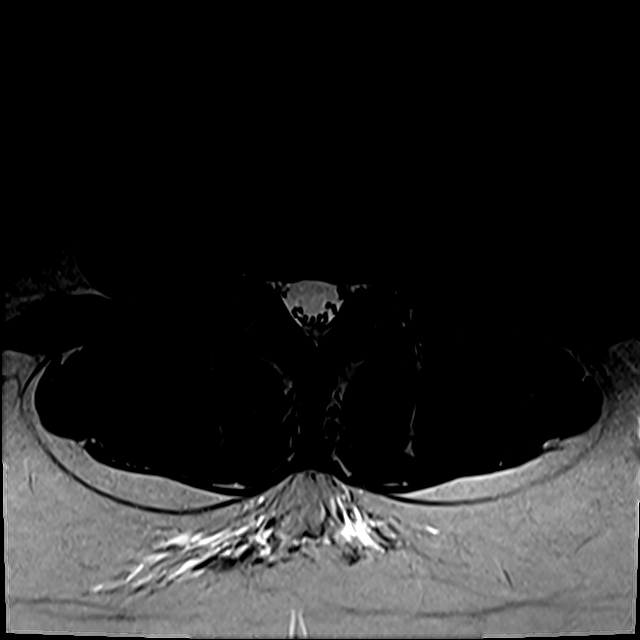
[im 22/43]
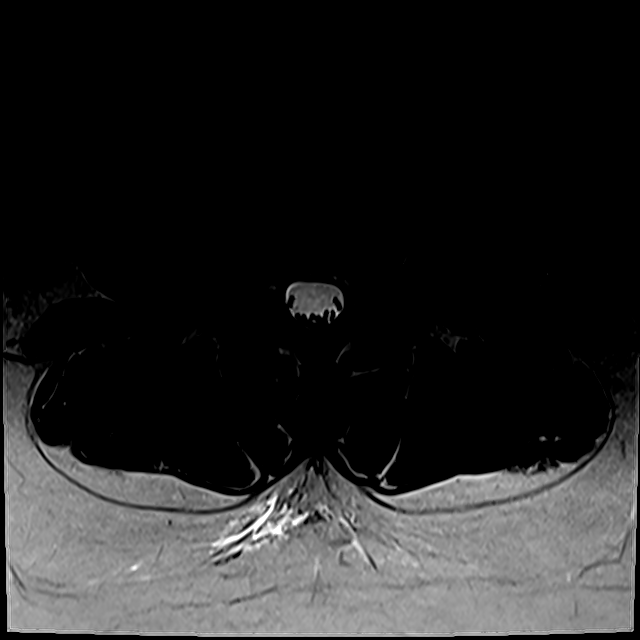
[im 37/43]
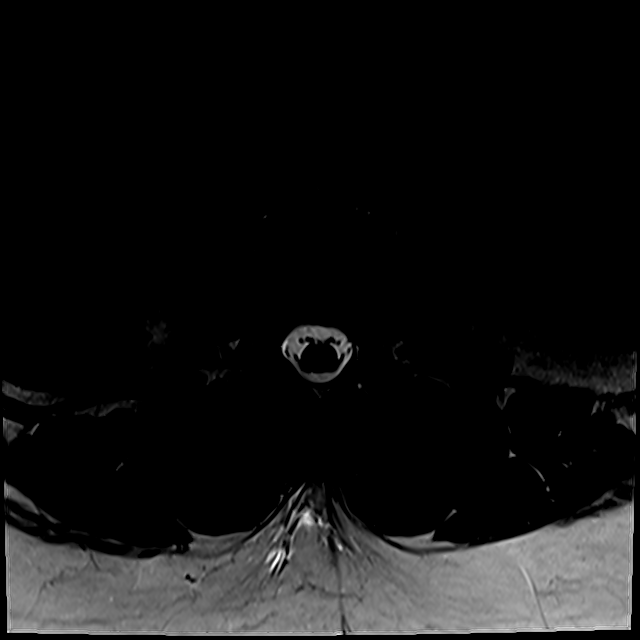

[18 of 48 positions shown; findings below may reference images not displayed]

FINDINGS: Segmentation: For the purposes of this dictation, five lumbar
vertebrae are assumed and the caudal most well-formed intervertebral
disc is designated L5-S1.

Alignment: Trace T12-L1 grade 1 retrolisthesis. Trace L3-L4 grade 1
retrolisthesis.

Vertebrae: Vertebral body height is maintained. No marrow edema or
suspicious osseous lesion.

Conus medullaris and cauda equina: Conus extends to the L2 level. No
signal abnormality within the visualized distal spinal cord.

Paraspinal and other soft tissues: No abnormality identified within
included portions of the abdomen/retroperitoneum. Paraspinal soft
tissues within normal limits.

Disc levels:

Unless otherwise stated, the level by level findings below have not
significantly changed since prior MRI [DATE].

Mild multilevel disc degeneration greatest at T12-L1 and L5-S1.

T12-L1: Trace retrolisthesis. Shallow disc bulge. Minimal partial
effacement of the ventral thecal sac without significant spinal
canal stenosis or neural foraminal narrowing.

L1-L2: No significant disc herniation or stenosis.

L2-L3: Tiny bilateral foraminal disc protrusions, new from the prior
examination. No significant spinal canal stenosis or neural
foraminal narrowing.

L3-L4: Tiny right foraminal disc protrusion, new from the prior
examination. No significant spinal canal stenosis or neural
foraminal narrowing.

L4-L5: Mild facet arthrosis. No significant disc herniation or
stenosis.

L5-S1: Mild facet arthrosis. No significant spinal canal stenosis.
Mild left neural foraminal narrowing.
IMPRESSION: Lumbar spondylosis as outlined and having slightly progressed at
several levels since the prior MRI of [DATE]. No significant
spinal canal stenosis at any level.

At L5-S1, facet arthrosis contributes to mild left neural foraminal
narrowing, unchanged.

Trace T12-L1 and L3-L4 grade 1 retrolisthesis.

## 2020-01-10 ENCOUNTER — Other Ambulatory Visit: Payer: Self-pay | Admitting: Neurosurgery

## 2020-01-10 DIAGNOSIS — M542 Cervicalgia: Secondary | ICD-10-CM

## 2020-02-03 ENCOUNTER — Other Ambulatory Visit: Payer: Self-pay

## 2020-02-03 ENCOUNTER — Ambulatory Visit
Admission: RE | Admit: 2020-02-03 | Discharge: 2020-02-03 | Disposition: A | Discharge status: Home / Self Care | Payer: Medicare Other | Source: Ambulatory Visit | Attending: Neurosurgery | Admitting: Neurosurgery

## 2020-02-03 DIAGNOSIS — M542 Cervicalgia: Secondary | ICD-10-CM

## 2020-02-03 IMAGING — MR MR CERVICAL SPINE W/O CM
4 of 5 series · 30 of 48 positions shown · non-contrast
Comparison: [DATE]

CLINICAL DATA: Neck pain radiating down the back and into both arms
for 6+ months. Numbness in the arms.

EXAM:
MRI CERVICAL SPINE WITHOUT CONTRAST
TECHNIQUE: Multiplanar, multisequence MR imaging of the cervical spine was
performed. No intravenous contrast was administered.

[Series 3: T2 · sagittal · 3.0mm · 0.82mm/px · 7 of 17 slices shown (1 of 2)]
[im 1/17]
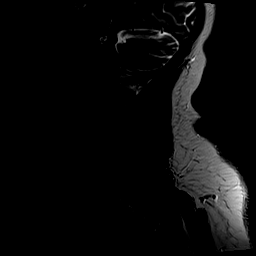
[im 3/17]
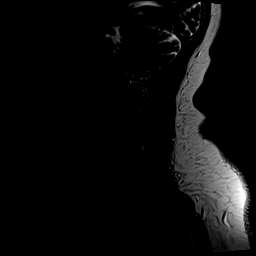
[im 6/17]
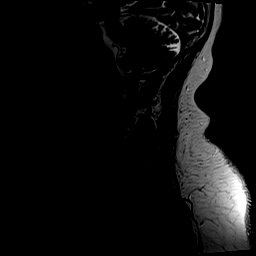
[im 9/17]
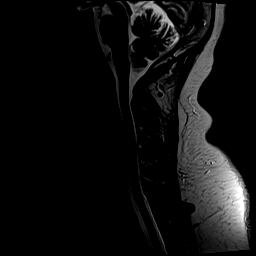
[im 11/17]
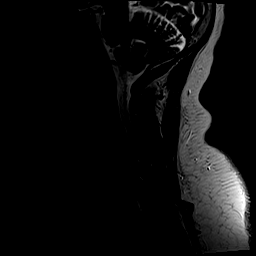
[im 14/17]
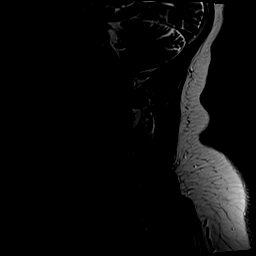
[im 17/17]
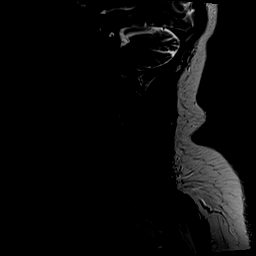

[Series 4: T1 · sagittal · 3.0mm · 0.41mm/px · 7 of 17 slices shown]
[im 1/17]
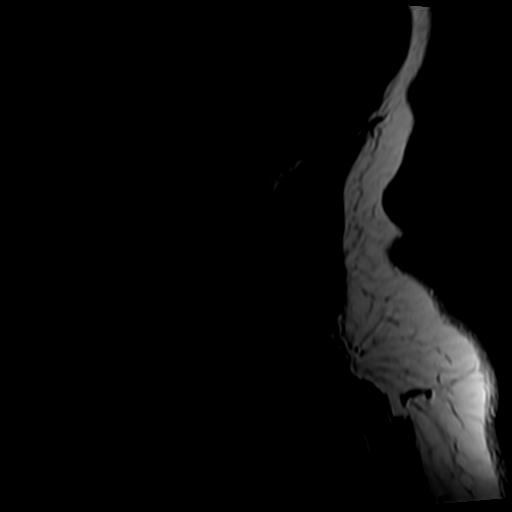
[im 3/17]
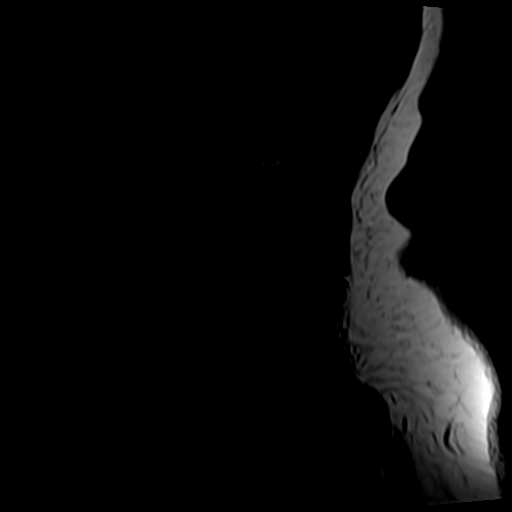
[im 6/17]
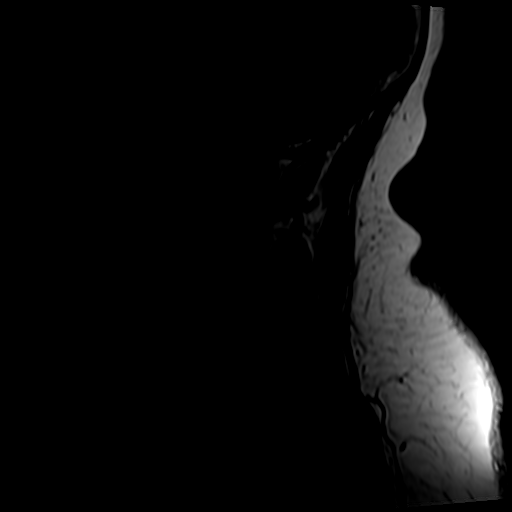
[im 9/17]
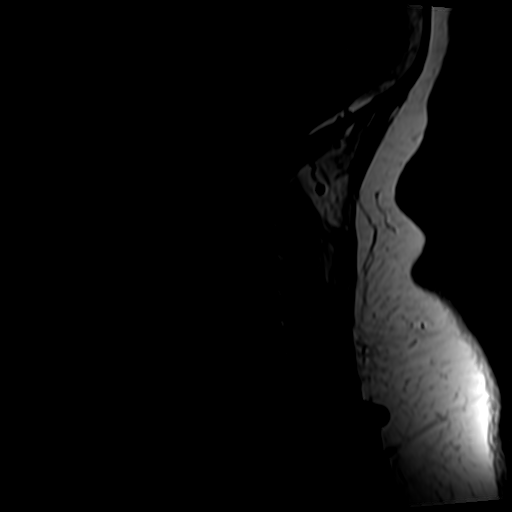
[im 11/17]
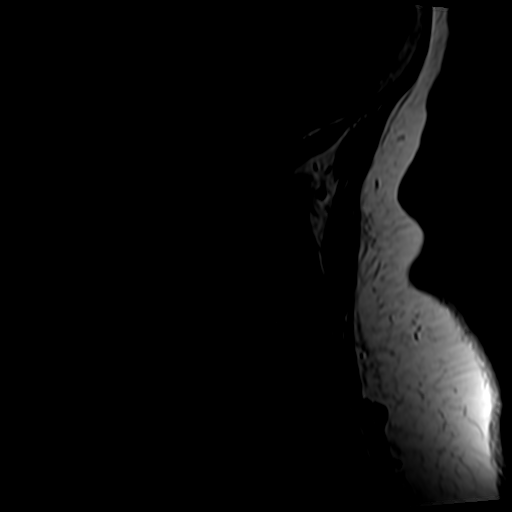
[im 14/17]
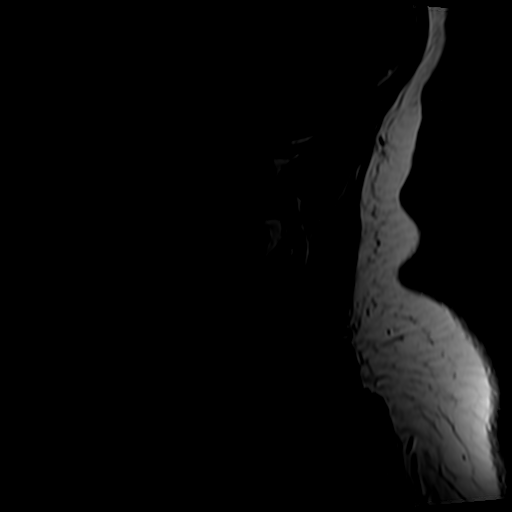
[im 17/17]
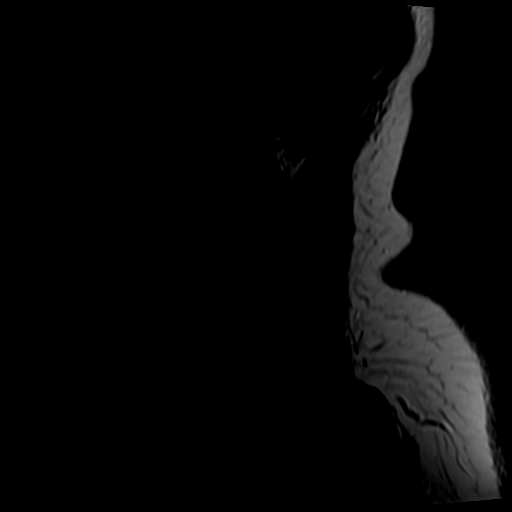

[Series 5: tir sag · sagittal · 3.0mm · 0.41mm/px · 7 of 17 slices shown]
[im 1/17]
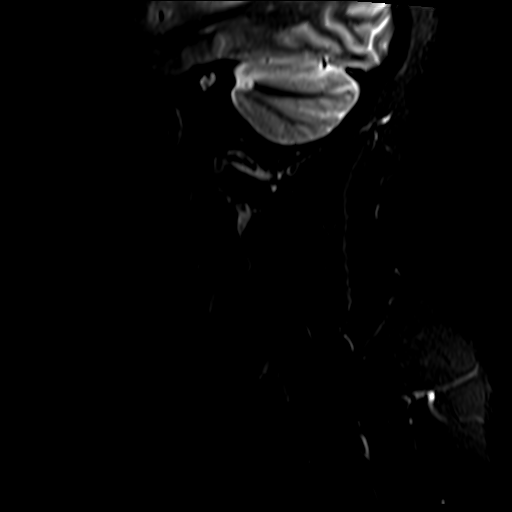
[im 3/17]
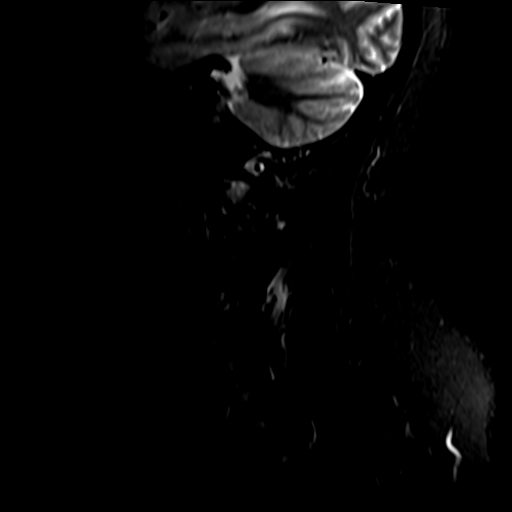
[im 5/17]
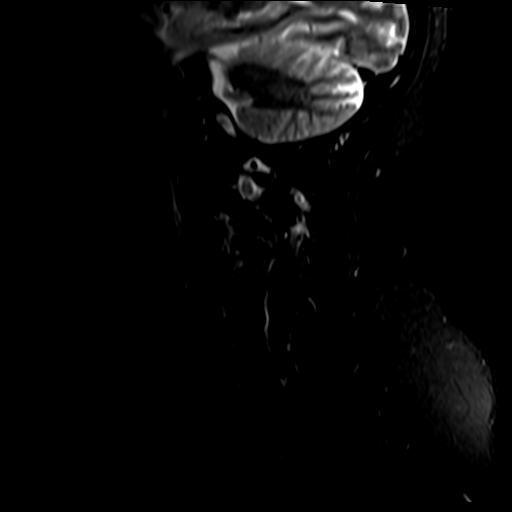
[im 7/17]
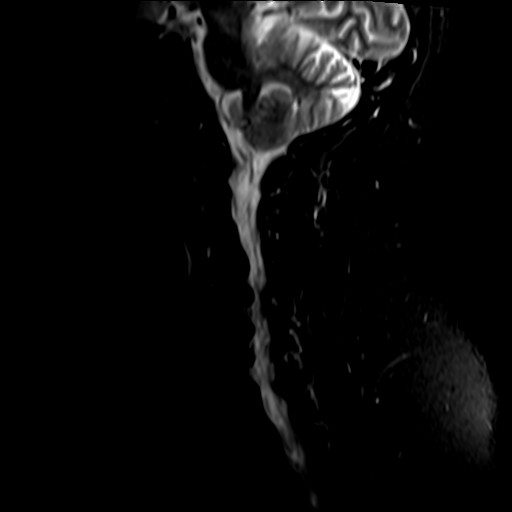
[im 10/17]
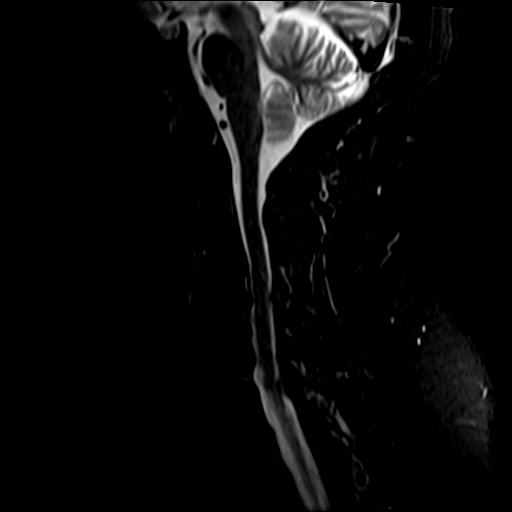
[im 12/17]
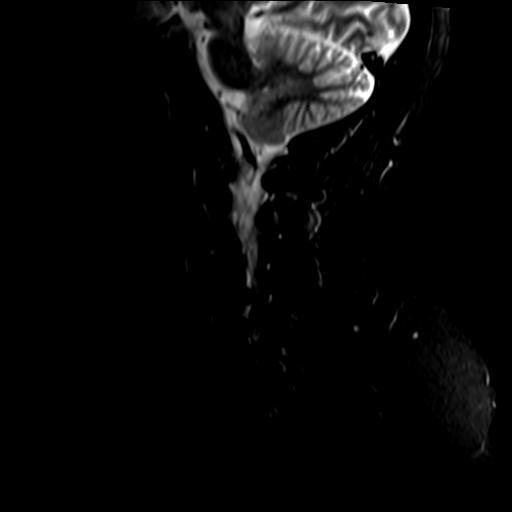
[im 14/17]
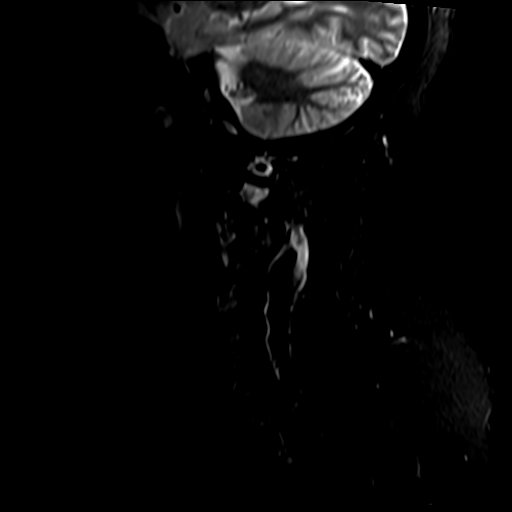

[Series 7: T2 · axial · 3.0mm · 0.70mm/px · z∈[-3,+98]mm · 9 of 28 slices shown (2 of 2)]
[im 1/28]
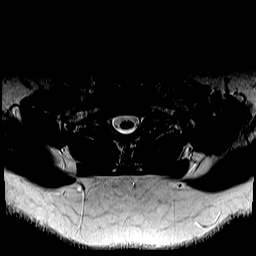
[im 5/28]
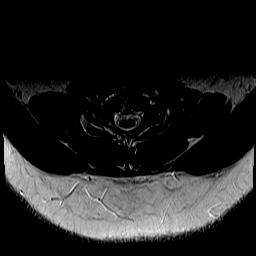
[im 10/28]
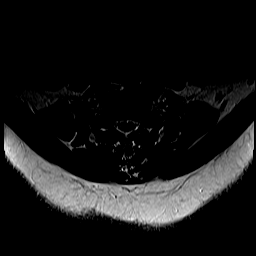
[im 12/28]
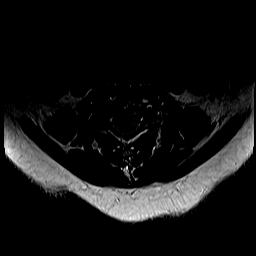
[im 14/28]
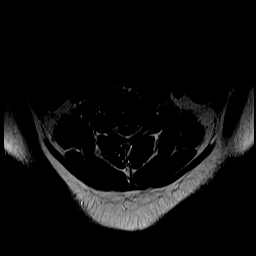
[im 16/28]
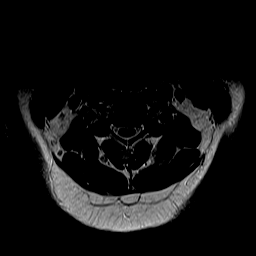
[im 19/28]
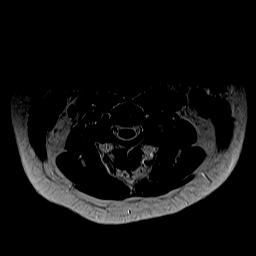
[im 23/28]
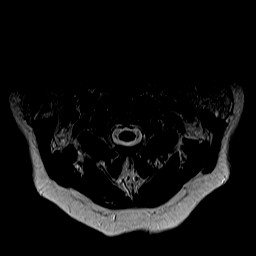
[im 28/28]
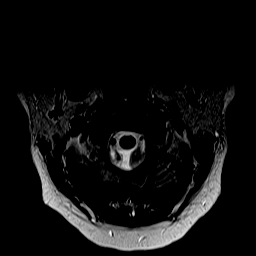

[30 of 48 positions shown; findings below may reference images not displayed]

FINDINGS: Alignment: Unchanged mild reversal of the normal cervical lordosis.
Trace anterolisthesis of C2 on C3.

Vertebrae: No fracture or suspicious osseous lesion. Mild disc space
narrowing from C3-4 to C6-7 with associated degenerative endplate
changes including low level degenerative edema.

Cord: Normal signal.

Posterior Fossa, vertebral arteries, paraspinal tissues:
Unremarkable.

Disc levels:

C2-3: Severe facet arthrosis without disc herniation or stenosis,
unchanged.

C3-4: Disc bulging, uncovertebral spurring, and mild facet arthrosis
result in borderline to mild spinal stenosis and mild bilateral
neural foraminal stenosis, unchanged.

C4-5: Broad-based posterior disc osteophyte complex and mild facet
arthrosis result in mild-to-moderate spinal stenosis and severe
bilateral neural foraminal stenosis with potential bilateral C5
nerve root impingement, unchanged.

C5-6: Broad-based posterior disc osteophyte complex results in
moderate to severe bilateral neural foraminal stenosis with
potential bilateral C6 nerve root impingement, unchanged. No spinal
stenosis.

C6-7: Disc bulging and uncovertebral spurring result in severe
bilateral neural foraminal stenosis with potential bilateral C7
nerve root impingement, unchanged. No spinal stenosis.

C7-T1: Mild disc bulging and severe facet arthrosis result in
moderate to severe right and mild left neural foraminal stenosis
with potential right C8 nerve root impingement, unchanged. No spinal
stenosis.
IMPRESSION: Unchanged cervical disc and facet degeneration resulting in
mild-to-moderate spinal stenosis at C4-5 and severe multilevel
neural foraminal stenosis as above.

## 2020-03-09 ENCOUNTER — Other Ambulatory Visit: Payer: Self-pay | Admitting: Neurosurgery

## 2020-03-27 NOTE — Progress Notes (Signed)
Surgical Instructions    Your procedure is scheduled on Monday 04/02/2020.  Report to [facility] Main Entrance "A" at 07:30 A.M., then check in with the Admitting office.   Call this number if you have problems the morning of surgery:  [phone]    If you have any questions prior to your surgery date call [phone]: Open Monday-Friday 8am-4pm    Remember:  Do not eat or drink after midnight the night before your surgery     Take these medicines the morning of surgery with A SIP OF WATER: Bupropion (Wellbutrin SR) Nebivolol HCl Omeprazole (Prilosec) Rosuvastatin (Crestor) Sertraline (Zoloft)  If needed you may take the following medications the morning of surgery: Albuterol (Ventolin) inhaler - Please bring all inhalers with you the day of surgery.  Fluticasone (Flonase) nasal spray Hydrocodone-acetaminophen (Norco) Promethazine (Phenergan) Tizanidine (Zanaflex)  As of today, STOP taking any Aspirin or aspirin-containing products (unless otherwise instructed by your surgeon), Aleve, Naproxen, Ibuprofen, Motrin, Advil, Goody's, BC's, all herbal medications, supplements, fish oil, and all vitamins.  Follow your surgeon's instructions on when to stop Aspirin.  If no instructions were given by your surgeon then you will need to call the office to get those instructions.    WHAT DO I DO ABOUT MY DIABETES MEDICATION?  Marland Kitchen The day of surgery, do not take other diabetes injectables, including Semaglutide (Ozempic)    HOW TO MANAGE YOUR DIABETES BEFORE AND AFTER SURGERY  Why is it important to control my blood sugar before and after surgery? . Improving blood sugar levels before and after surgery helps healing and can limit problems. . A way of improving blood sugar control is eating a healthy diet by: o  Eating less sugar and carbohydrates o  Increasing activity/exercise o  Talking with your doctor about reaching your blood sugar goals . High blood sugars (greater than 180  mg/dL) can raise your risk of infections and slow your recovery, so you will need to focus on controlling your diabetes during the weeks before surgery. . Make sure that the doctor who takes care of your diabetes knows about your planned surgery including the date and location.  How do I manage my blood sugar before surgery? . Check your blood sugar at least 4 times a day, starting 2 days before surgery, to make sure that the level is not too high or low. . Check your blood sugar the morning of your surgery when you wake up and every 2 hours until you get to the Short Stay unit. o If your blood sugar is less than 70 mg/dL, you will need to treat for low blood sugar: - Do not take insulin. - Treat a low blood sugar (less than 70 mg/dL) with  cup of clear juice (cranberry or apple), 4 glucose tablets, OR glucose gel. - Recheck blood sugar in 15 minutes after treatment (to make sure it is greater than 70 mg/dL). If your blood sugar is not greater than 70 mg/dL on recheck, call [phone] for further instructions. . Report your blood sugar to the short stay nurse when you get to Short Stay.  . If you are admitted to the hospital after surgery: o Your blood sugar will be checked by the staff and you will probably be given insulin after surgery (instead of oral diabetes medicines) to make sure you have good blood sugar levels. o The goal for blood sugar control after surgery is 80-180 mg/dL.           Do  NOT Smoke (Tobacco/Vaping) or drink Alcohol 24 hours prior to your procedure  If you use a CPAP at night, you may bring all equipment for your overnight stay.   Contacts, glasses, hearing aids, dentures or partials may not be worn into surgery, please bring cases for these belongings   For patients admitted to the hospital, discharge time will be determined by your treatment team.   Patients discharged the day of surgery will not be allowed to drive home, and someone needs to stay with them for  24 hours.    Special instructions:   [facility]- Preparing For Surgery  Before surgery, you can play an important role. Because skin is not sterile, your skin needs to be as free of germs as possible. You can reduce the number of germs on your skin by washing with CHG (chlorahexidine gluconate) Soap before surgery.  CHG is an antiseptic cleaner which kills germs and bonds with the skin to continue killing germs even after washing.    Oral Hygiene is also important to reduce your risk of infection.  Remember - BRUSH YOUR TEETH THE MORNING OF SURGERY WITH YOUR REGULAR TOOTHPASTE  Please do not use if you have an allergy to CHG or antibacterial soaps. If your skin becomes reddened/irritated stop using the CHG.  Do not shave (including legs and underarms) for at least 48 hours prior to first CHG shower. It is OK to shave your face.  Please follow these instructions carefully.   You are going to shower with CHG soap 2 different times.  The NIGHT BEFORE SURGERY/PROCEDURE and then again the MORNING OF SURGERY/PROCEDURE   1. If you chose to wash your hair, wash your hair first as usual with your normal shampoo.  2. After you shampoo, rinse your hair and body thoroughly to remove the shampoo.  3. Wash Face and genitals (private parts) with your normal soap.   4. THEN Shower with CHG Soap.   5. Use CHG as you would any other liquid soap. You can apply CHG directly to the skin and wash gently with a pouf/sponge or a clean washcloth.   6. Apply the CHG Soap to your body ONLY FROM THE NECK DOWN.  Do not use on open wounds or open sores. Avoid contact with your eyes, ears, mouth and genitals (private parts). Wash Face and genitals (private parts)  with your normal soap.   7. Wash thoroughly, paying special attention to the area where your surgery will be performed.  8. Thoroughly rinse your body with warm water from the neck down.  9. DO NOT shower/wash with your normal soap after using and  rinsing off the CHG Soap.  10. Pat yourself dry with a CLEAN TOWEL.  11. Wear CLEAN PAJAMAS to bed the night before surgery  12. Place CLEAN SHEETS on your bed the night before your surgery  13. DO NOT SLEEP WITH PETS.   Day of Surgery: Shower with CHG soap as directed.  Do not shave 48 hours prior to surgery.  Do not wear lotions, powders, perfumes, or deodorant.  Wear Clean/Comfortable clothing the morning of surgery  Remember to brush your teeth WITH YOUR REGULAR TOOTHPASTE.             Do not wear jewelry, make up, or nail polish  Do not bring valuables to the hospital.             [facility] is not responsible for any belongings or valuables.    Please  read over the following fact sheets that you were given.

## 2020-03-28 ENCOUNTER — Other Ambulatory Visit: Payer: Self-pay

## 2020-03-28 ENCOUNTER — Encounter (HOSPITAL_COMMUNITY): Payer: Self-pay

## 2020-03-28 ENCOUNTER — Encounter (HOSPITAL_COMMUNITY)
Admission: RE | Admit: 2020-03-28 | Discharge: 2020-03-28 | Disposition: A | Discharge status: Home / Self Care | Payer: Medicare Other | Source: Ambulatory Visit | Attending: Neurosurgery | Admitting: Neurosurgery

## 2020-03-28 DIAGNOSIS — I491 Atrial premature depolarization: Secondary | ICD-10-CM | POA: Insufficient documentation

## 2020-03-28 DIAGNOSIS — Z01818 Encounter for other preprocedural examination: Secondary | ICD-10-CM | POA: Insufficient documentation

## 2020-03-28 HISTORY — DX: Unspecified osteoarthritis, unspecified site: M19.90

## 2020-03-28 HISTORY — DX: Gastro-esophageal reflux disease without esophagitis: K21.9

## 2020-03-28 HISTORY — DX: Headache, unspecified: R51.9

## 2020-03-28 HISTORY — DX: Presence of dental prosthetic device (complete) (partial): Z97.2

## 2020-03-28 HISTORY — DX: Other seasonal allergic rhinitis: J30.2

## 2020-03-28 HISTORY — DX: Sleep apnea, unspecified: G47.30

## 2020-03-28 HISTORY — DX: Bronchitis, not specified as acute or chronic: J40

## 2020-03-28 HISTORY — DX: Hyperlipidemia, unspecified: E78.5

## 2020-03-28 HISTORY — DX: Thyrotoxicosis, unspecified without thyrotoxic crisis or storm: E05.90

## 2020-03-28 HISTORY — DX: Pneumonia, unspecified organism: J18.9

## 2020-03-28 HISTORY — DX: Presence of spectacles and contact lenses: Z97.3

## 2020-03-28 LAB — BASIC METABOLIC PANEL
Anion gap: 9 (ref 5–15)
BUN: 7 mg/dL (ref 6–20)
CO2: 24 mmol/L (ref 22–32)
Calcium: 9.3 mg/dL (ref 8.9–10.3)
Chloride: 105 mmol/L (ref 98–111)
Creatinine, Ser: 0.92 mg/dL (ref 0.44–1.00)
GFR, Estimated: >60 mL/min (ref >60)
Glucose, Bld: 114 mg/dL — ABNORMAL HIGH (ref 70–99)
Potassium: 3.9 mmol/L (ref 3.5–5.1)
Sodium: 138 mmol/L (ref 135–145)

## 2020-03-28 LAB — CBC
HCT: 42.7 % (ref 36.0–46.0)
Hemoglobin: 13.8 g/dL (ref 12.0–15.0)
MCH: 28.7 pg (ref 26.0–34.0)
MCHC: 32.3 g/dL (ref 30.0–36.0)
MCV: 88.8 fL (ref 80.0–100.0)
Platelets: 225 10*3/uL (ref 150–400)
RBC: 4.81 MIL/uL (ref 3.87–5.11)
RDW: 13.7 % (ref 11.5–15.5)
WBC: 5.7 10*3/uL (ref 4.0–10.5)
nRBC: 0 % (ref 0.0–0.2)

## 2020-03-28 LAB — SURGICAL PCR SCREEN
MRSA, PCR: NEGATIVE
Staphylococcus aureus: NEGATIVE

## 2020-03-28 LAB — HEMOGLOBIN A1C
Hgb A1c MFr Bld: 6.8 % — ABNORMAL HIGH (ref 4.8–5.6)
Mean Plasma Glucose: 148.46 mg/dL

## 2020-03-28 LAB — TYPE AND SCREEN
ABO/RH(D): AB POS
Antibody Screen: NEGATIVE

## 2020-03-28 LAB — GLUCOSE, CAPILLARY: Glucose-Capillary: 121 mg/dL — ABNORMAL HIGH (ref 70–99)

## 2020-03-28 NOTE — Progress Notes (Signed)
Patient denies shortness of breath, fever, cough or chest pain.  PCP - Dr Arnoldo Lenis Cardiologist - n/a Endocrinology - Dr Izell [place]  Chest x-ray - n/a EKG - 03/28/20 Stress Test - 07/04/15 CE ECHO - 07/04/15 CE Cardiac Cath - n/a  Fasting Blood Sugar - 70s Checks Blood Sugar 1 times a day  Sleep Apnea - Yes Uses cpap nightly  Anesthesia review: Yes  Coronavirus Screening Patient uses medical transportation and will need to be tested for covid on DOS. Do you have any of the following symptoms:  Cough yes/no: No Fever (>100.23F)  yes/no: No Runny nose yes/no: No Sore throat yes/no: No Difficulty breathing/shortness of breath  yes/no: No  Have you traveled in the last 14 days and where? yes/no: No  Patient verbalized understanding of instructions that were given to them at the PAT appointment.

## 2020-03-28 NOTE — Progress Notes (Signed)
error 

## 2020-03-29 ENCOUNTER — Encounter (HOSPITAL_COMMUNITY): Payer: Self-pay

## 2020-03-29 NOTE — Anesthesia Preprocedure Evaluation (Addendum)
Anesthesia Evaluation  Patient identified by MRN, date of birth, ID band Patient awake    Reviewed: Allergy & Precautions, NPO status , Patient's Chart, lab work & pertinent test results, reviewed documented beta blocker date and time   History of Anesthesia Complications Negative for: history of anesthetic complications  Airway Mallampati: III  TM Distance: >3 FB Neck ROM: Full    Dental  (+) Edentulous Upper,    Pulmonary sleep apnea and Continuous Positive Airway Pressure Ventilation ,    Pulmonary exam normal        Cardiovascular hypertension, Pt. on medications and Pt. on home beta blockers Normal cardiovascular exam     Neuro/Psych  Headaches, Depression Cervicalgia    GI/Hepatic Neg liver ROS, GERD  Medicated and Controlled,  Endo/Other  diabetes, Type obesity  Renal/GU negative Renal ROS  negative genitourinary   Musculoskeletal  (+) Arthritis ,   Abdominal   Peds  Hematology negative hematology ROS (+)   Anesthesia Other Findings Day of surgery medications reviewed with patient.  Reproductive/Obstetrics negative OB ROS                           Anesthesia Physical Anesthesia Plan  ASA: III  Anesthesia Plan: General   Post-op Pain Management:    Induction: Intravenous  PONV Risk Score and Plan: 4 or greater and Treatment may vary due to age or medical condition, Ondansetron, Dexamethasone and Midazolam  Airway Management Planned: Oral ETT and Video Laryngoscope Planned  Additional Equipment:   Intra-op Plan:   Post-operative Plan: Extubation in OR  Informed Consent: I have reviewed the patients History and Physical, chart, labs and discussed the procedure including the risks, benefits and alternatives for the proposed anesthesia with the patient or authorized representative who has indicated his/her understanding and acceptance.     Dental advisory  given  Plan Discussed with: CRNA  Anesthesia Plan Comments: (PAT note written 03/29/2020 by Shonna Chock, PA-C. )      Anesthesia Quick Evaluation

## 2020-03-29 NOTE — Progress Notes (Signed)
Anesthesia Chart Review:  Case: [id number] Date/Time: 04/02/20 0915   Procedure: ACDF - C3-C4 - C4-C5 - C5-C6 - C6-C7 (N/A )   Anesthesia type: General   Pre-op diagnosis: Cervicalgia   Location: [facility] ROOM 21 / [facility]   Surgeons: Donalee Citrin, MD      DISCUSSION: Patient is a 60 year old female scheduled for the above procedure.  History includes never smoker, HTN, HLD, DM2, Hepatitis C (s/p treatment 2014), OSA (uses CPAP), hyperthyroidism. BMI is consistent with morbid obesity.  Prior cardiology evaluation for palpitations with reassuring echo and Holter monitor in 2019. Non-ischemic ETT in 2017 at workload achieved. Denied shortness of breath and chest pain per PAT RN interview.  Normal thyroid panel 01/2020 after methimazole stopped. Endocrinology following.   Last ASA 03/22/20.   Patient uses medical transportation, so she will be tested for COVID-19 on the day of surgery.   VS: BP (!) 164/91   Pulse 87   Temp 36.9 C (Oral)   Resp 19   Ht 5\' 8"  (1.727 m)   Wt 132.9 kg   LMP  (LMP Unknown)   SpO2 99%   BMI 44.55 kg/m    PROVIDERS: , MD is PCP [facility] [place], see Care Everywhere) - [facility], MD is Endocrinology [facility] [place], see Care Everywhere). Last visit 02/20/20. She had been off methimazole for six months, and 01/30/20 thyroid labs normal.  Continue to monitor. - 03/29/20, MD is cardiologist [facility] [place]). Initial visit 10/30/17 for HTN, hypercholesterolemia, palpitations. 2019 echo showed normal LVF, mild MR. Holter monitor showed rare PVCs and PACs that did not correlate with any symptoms. 2017 ETT nonischemic for workload achieved. Last follow-up visit 03/02/18 with 05/01/18, NP. Patient doing well from a cardiac standpoint. Palpitations "somewhat controlled", so no medication changes made.    LABS: Labs reviewed: Acceptable for surgery. TSH 1.602, Free T4 0.6, Free T3 3.43 on 01/30/20 [facility]). (all labs ordered are  listed, but only abnormal results are displayed)  Labs Reviewed  GLUCOSE, CAPILLARY - Abnormal; Notable for the following components:      Result Value   Glucose-Capillary 121 (*)    All other components within normal limits  BASIC METABOLIC PANEL - Abnormal; Notable for the following components:   Glucose, Bld 114 (*)    All other components within normal limits  HEMOGLOBIN A1C - Abnormal; Notable for the following components:   Hgb A1c MFr Bld 6.8 (*)    All other components within normal limits  SURGICAL PCR SCREEN  CBC  TYPE AND SCREEN     IMAGES: MRI C-spine 02/03/20: IMPRESSION: Unchanged cervical disc and facet degeneration resulting in mild-to-moderate spinal stenosis at C4-5 and severe multilevel neural foraminal stenosis as above. [see full report]  02/05/20 Thyroid 02/22/19 [facility]): IMPRESSION:  Minor thyroid heterogeneity without nodule or acute finding by  ultrasound    EKG: 03/28/20:  Sinus rhythm with Premature atrial complexes Otherwise normal ECG No old tracing to compare Confirmed by 05/28/20 [phone]) on 03/29/2020 12:51:02 AM   CV: Carotid 05/29/2020 03/17/19 [facility]): Right Findings  No significant atherosclerosis or stenosis of the right common carotid  artery. The Doppler flow velocities within the right external carotid  artery are within normal limits. RT ICA = 1-39% stenosis. Antegrade  flow is noted in the right vertebral artery.   Left Findings  No significant atherosclerosis or stenosis of the left common carotid  artery. The  Doppler flow velocities within the left external carotid  artery are within normal limits. LT ICA = 1-39% stenosis. Antegrade  flow is noted in the left vertebral artery.    24 hour Holter Monitor 07/2917 [facility]):  Indication: palpitations  Baseline Rhythm: sinus rhythm  Rhythm Findings:  1. Ventricular: rare PVCs  2. Supraventricular: rare PACs  3. Bradyarrhythmias and Pauses: No   Symptoms reported:   palpitations   CONCLUSIONS:  1.  Unremarkable Holter monitor.  2. Arrhythmia as decribed as described  3. Symptoms were reported  4. Symptoms were not correlated with Arrhythmia as decribed --patient complained of dizziness and palpitations, no arrhythmias seen on these occasions.    Echo 08/12/17 [facility]): Summary  Normal left ventricular size and systolic function with no appreciable  segmental abnormality.  Ejection fraction is estimated at 65-70%  Borderline concentric left ventricular hypertrophy  Diastolic function appears mildly impaired  Mild mitral regurgitation.    ETT 07/04/15 [facility]): Narrative  No significant ST segment changes or arrhythmias were noted during  stress   Overall, the patient's exercise capacity was poor.   Negative ETT for ischemia at workload achieved   Past Medical History:  Diagnosis Date  . Arthritis    knees, ankles  . Bronchitis    uses inhaler prn  . Depression   . Diabetes mellitus without complication (HCC)    type 2  . GERD (gastroesophageal reflux disease)   . Headache    controlled with meds  . Hepatitis 2014   Hep C - was treated  . HLD (hyperlipidemia)   . Hypertension   . RLL pneumonia    x 1  . Seasonal allergies   . Sleep apnea    uses cpap nightly  . Wears glasses   . Wears partial dentures    upper    Past Surgical History:  Procedure Laterality Date  . ABDOMINAL HYSTERECTOMY    . ANTERIOR (CYSTOCELE) AND POSTERIOR REPAIR (RECTOCELE) WITH XENFORM GRAFT AND SACROSPINOUS FIXATION  2013  . CHOLECYSTECTOMY    . COLONOSCOPY    . right sided transverse abdominas plane block under ultrasound guidance  2018  . TUBAL LIGATION    . UPPER GI ENDOSCOPY    . WISDOM TOOTH EXTRACTION  1980s    MEDICATIONS: . albuterol (VENTOLIN HFA) 108 (90 Base) MCG/ACT inhaler  . aspirin EC 81 MG tablet  . buPROPion (WELLBUTRIN SR) 150 MG 12 hr tablet  . Cholecalciferol (VITAMIN D) 50 MCG (2000 UT) tablet  . famotidine  (PEPCID) 20 MG tablet  . fluticasone (FLONASE) 50 MCG/ACT nasal spray  . furosemide (LASIX) 20 MG tablet  . HYDROcodone-acetaminophen (NORCO) 7.5-325 MG tablet  . losartan (COZAAR) 100 MG tablet  . Nebivolol HCl 20 MG TABS  . omeprazole (PRILOSEC) 40 MG capsule  . promethazine (PHENERGAN) 12.5 MG tablet  . rOPINIRole (REQUIP) 0.25 MG tablet  . rosuvastatin (CRESTOR) 10 MG tablet  . Semaglutide, 1 MG/DOSE, (OZEMPIC, 1 MG/DOSE,) 2 MG/1.5ML SOPN  . sertraline (ZOLOFT) 100 MG tablet  . sertraline (ZOLOFT) 50 MG tablet  . tiZANidine (ZANAFLEX) 4 MG tablet   No current facility-administered medications for this encounter.    Shonna Chock, PA-C Surgical Short Stay/Anesthesiology [facility] Phone [phone] [facility] Phone [phone] 03/29/2020 11:49 AM

## 2020-03-30 MED ORDER — DEXTROSE 5 % IV SOLN
3.0000 g | INTRAVENOUS | Status: AC
  Administered 2020-04-02: 3 g via INTRAVENOUS
  Filled 2020-03-30: qty 3

## 2020-04-02 ENCOUNTER — Other Ambulatory Visit: Payer: Self-pay

## 2020-04-02 ENCOUNTER — Inpatient Hospital Stay (HOSPITAL_COMMUNITY)
Admission: RE | Admit: 2020-04-02 | Discharge: 2020-04-03 | DRG: 473 | Disposition: A | Discharge status: Home / Self Care | Payer: Medicare Other | Attending: Neurosurgery | Admitting: Neurosurgery

## 2020-04-02 ENCOUNTER — Inpatient Hospital Stay (HOSPITAL_COMMUNITY): Payer: Medicare Other

## 2020-04-02 ENCOUNTER — Encounter (HOSPITAL_COMMUNITY): Payer: Self-pay | Admitting: Neurosurgery

## 2020-04-02 ENCOUNTER — Inpatient Hospital Stay (HOSPITAL_COMMUNITY)
Admission: RE | Disposition: A | Discharge status: Home / Self Care | Payer: Self-pay | Source: Home / Self Care | Attending: Neurosurgery

## 2020-04-02 ENCOUNTER — Inpatient Hospital Stay (HOSPITAL_COMMUNITY): Payer: Medicare Other | Admitting: Anesthesiology

## 2020-04-02 ENCOUNTER — Inpatient Hospital Stay (HOSPITAL_COMMUNITY): Payer: Medicare Other | Admitting: Vascular Surgery

## 2020-04-02 DIAGNOSIS — Z886 Allergy status to analgesic agent status: Secondary | ICD-10-CM

## 2020-04-02 DIAGNOSIS — Z20822 Contact with and (suspected) exposure to covid-19: Secondary | ICD-10-CM | POA: Diagnosis present

## 2020-04-02 DIAGNOSIS — Z79899 Other long term (current) drug therapy: Secondary | ICD-10-CM

## 2020-04-02 DIAGNOSIS — E119 Type 2 diabetes mellitus without complications: Secondary | ICD-10-CM | POA: Diagnosis present

## 2020-04-02 DIAGNOSIS — Z7982 Long term (current) use of aspirin: Secondary | ICD-10-CM | POA: Diagnosis not present

## 2020-04-02 DIAGNOSIS — M4802 Spinal stenosis, cervical region: Principal | ICD-10-CM | POA: Diagnosis present

## 2020-04-02 DIAGNOSIS — E0590 Thyrotoxicosis, unspecified without thyrotoxic crisis or storm: Secondary | ICD-10-CM | POA: Diagnosis present

## 2020-04-02 DIAGNOSIS — I10 Essential (primary) hypertension: Secondary | ICD-10-CM | POA: Diagnosis present

## 2020-04-02 DIAGNOSIS — G4730 Sleep apnea, unspecified: Secondary | ICD-10-CM | POA: Diagnosis present

## 2020-04-02 DIAGNOSIS — K219 Gastro-esophageal reflux disease without esophagitis: Secondary | ICD-10-CM | POA: Diagnosis present

## 2020-04-02 DIAGNOSIS — E785 Hyperlipidemia, unspecified: Secondary | ICD-10-CM | POA: Diagnosis present

## 2020-04-02 DIAGNOSIS — F32A Depression, unspecified: Secondary | ICD-10-CM | POA: Diagnosis present

## 2020-04-02 DIAGNOSIS — Z419 Encounter for procedure for purposes other than remedying health state, unspecified: Secondary | ICD-10-CM

## 2020-04-02 DIAGNOSIS — J302 Other seasonal allergic rhinitis: Secondary | ICD-10-CM | POA: Diagnosis present

## 2020-04-02 DIAGNOSIS — Z888 Allergy status to other drugs, medicaments and biological substances status: Secondary | ICD-10-CM

## 2020-04-02 DIAGNOSIS — M5412 Radiculopathy, cervical region: Secondary | ICD-10-CM | POA: Diagnosis present

## 2020-04-02 HISTORY — PX: ANTERIOR CERVICAL DECOMPRESSION/DISCECTOMY FUSION 4 LEVELS: SHX5556

## 2020-04-02 LAB — GLUCOSE, CAPILLARY
Glucose-Capillary: 140 mg/dL — ABNORMAL HIGH (ref 70–99)
Glucose-Capillary: 190 mg/dL — ABNORMAL HIGH (ref 70–99)
Glucose-Capillary: 230 mg/dL — ABNORMAL HIGH (ref 70–99)
Glucose-Capillary: 218 mg/dL — ABNORMAL HIGH (ref 70–99)

## 2020-04-02 LAB — SARS CORONAVIRUS 2 BY RT PCR (HOSPITAL ORDER, PERFORMED IN ~~LOC~~ HOSPITAL LAB): SARS Coronavirus 2: NEGATIVE

## 2020-04-02 LAB — ABO/RH: ABO/RH(D): AB POS

## 2020-04-02 IMAGING — RF DG CERVICAL SPINE 2 OR 3 VIEWS
1 series · 2 of 2 positions shown · non-contrast
Comparison: MR cervical spine [DATE].

CLINICAL DATA: ACDF C3-7.

EXAM:
CERVICAL SPINE - 2-3 VIEW; DG C-ARM 1-60 MIN

[Series 1: run · 2 of 2 slices shown]
[im 1/2]
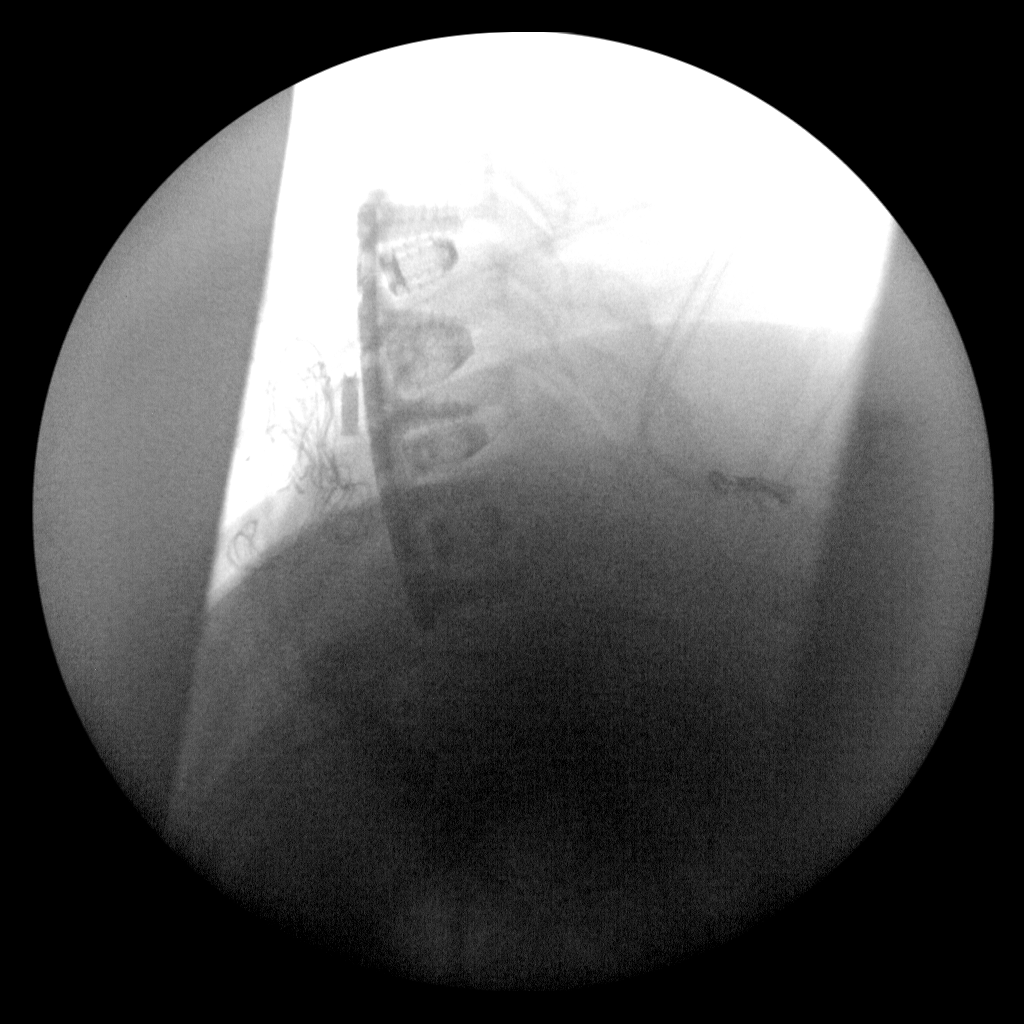
[im 2/2]
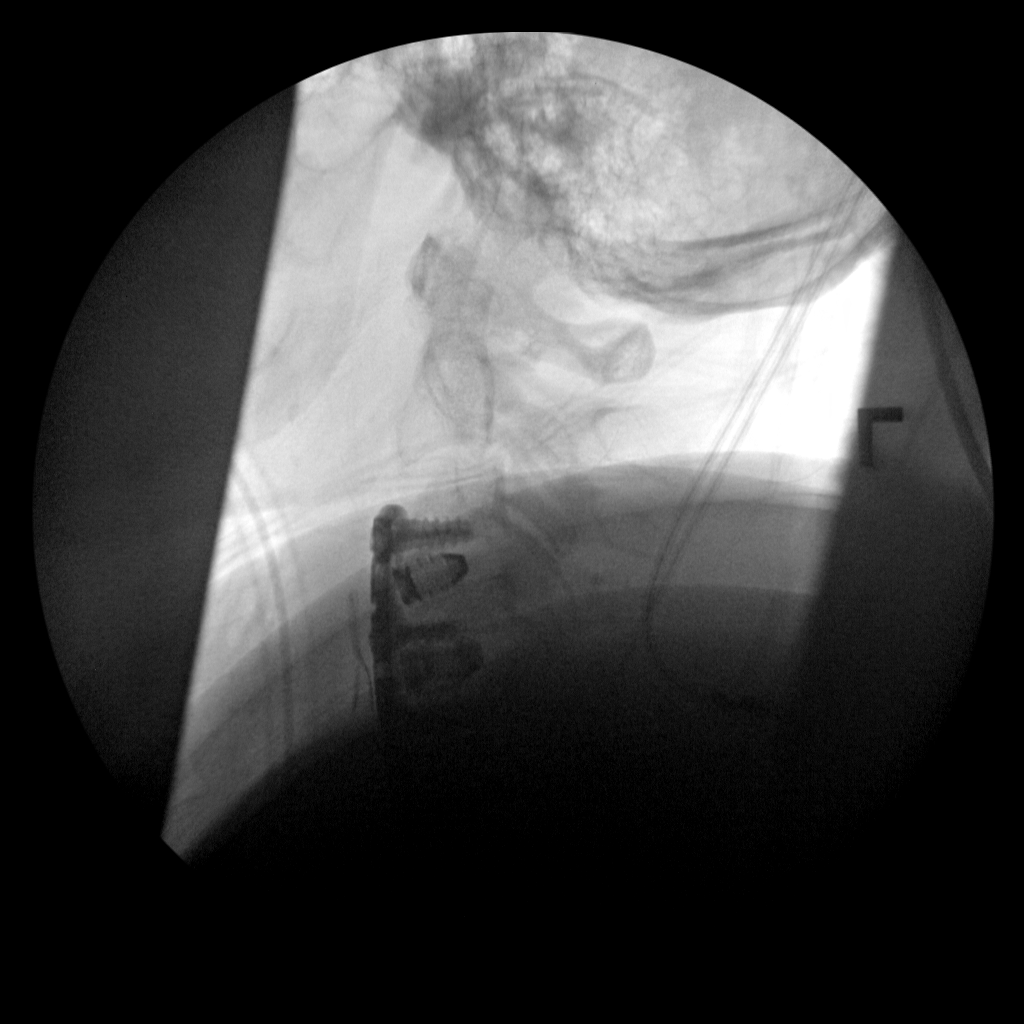

[2 of 2 positions shown; findings below may reference images not displayed]

FINDINGS: Two intraoperative fluoroscopic spot views of the cervical spine are
provided in the lateral projection. Osseous detail is markedly
degraded by technique and the patient's shoulders. Patient status
post C3-7 anterior cervical fusion with interbody spacers.
IMPRESSION: Intraoperative visualization of C3-7 anterior cervical fusion. Image
quality is markedly degraded by technique and body habitus.

## 2020-04-02 IMAGING — RF DG C-ARM 1-60 MIN
1 series · 2 of 2 positions shown · non-contrast
Comparison: MR cervical spine [DATE].

CLINICAL DATA: ACDF C3-7.

EXAM:
CERVICAL SPINE - 2-3 VIEW; DG C-ARM 1-60 MIN

[Series 1: run · 2 of 2 slices shown]
[im 1/2]
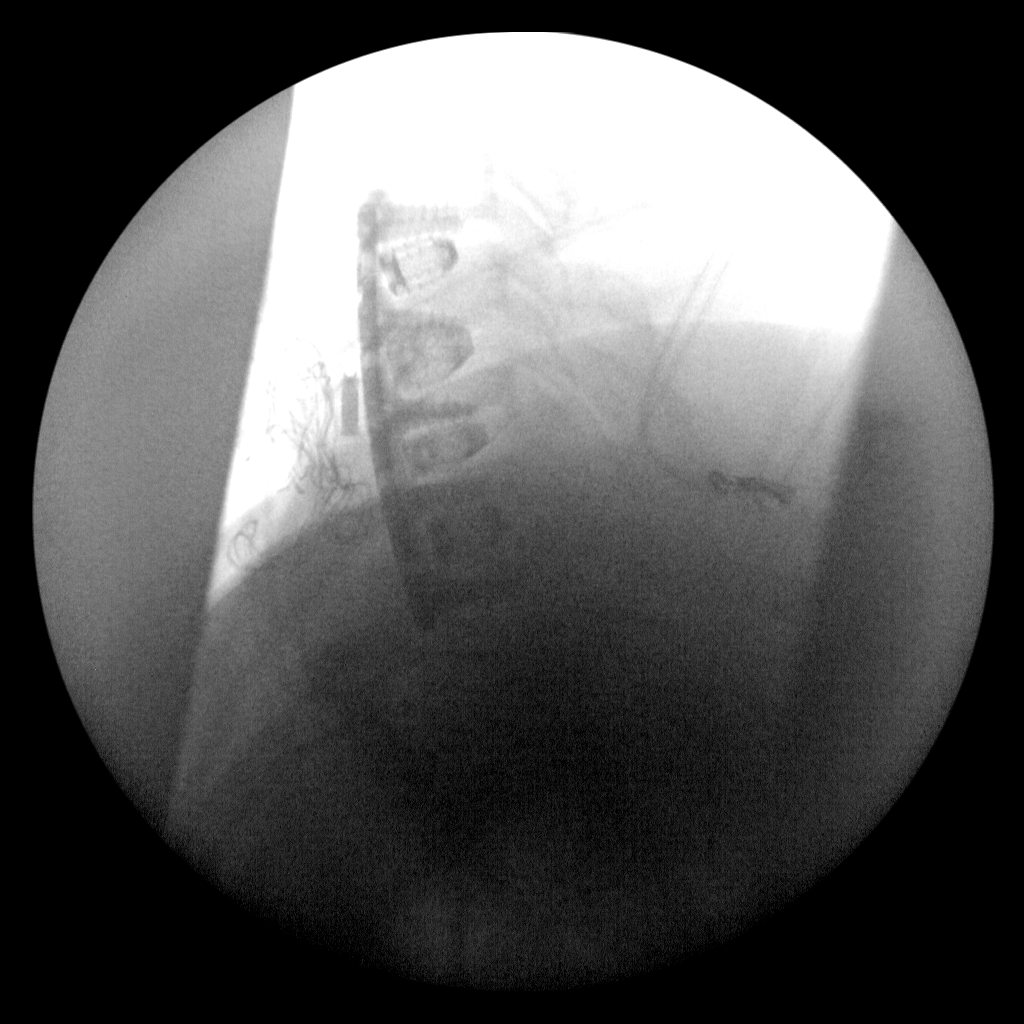
[im 2/2]
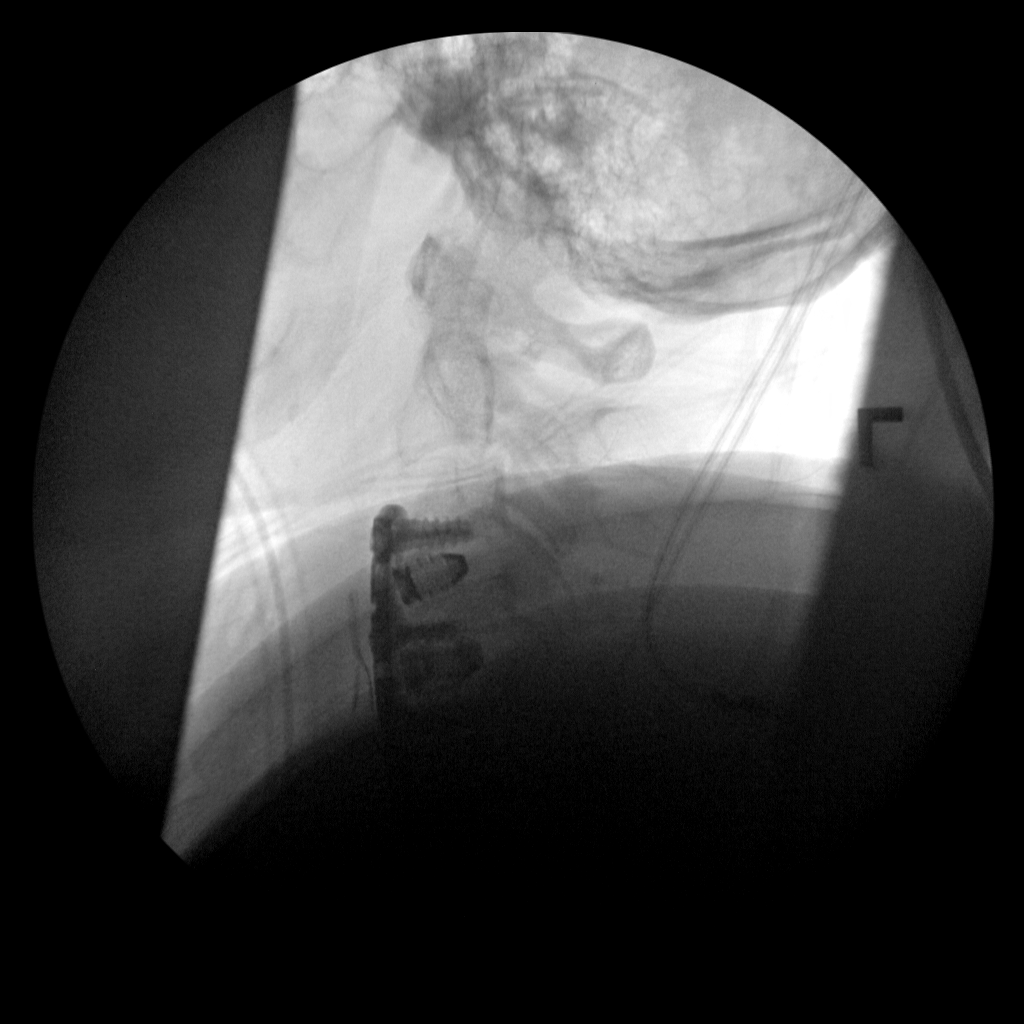

[2 of 2 positions shown; findings below may reference images not displayed]

FINDINGS: Two intraoperative fluoroscopic spot views of the cervical spine are
provided in the lateral projection. Osseous detail is markedly
degraded by technique and the patient's shoulders. Patient status
post C3-7 anterior cervical fusion with interbody spacers.
IMPRESSION: Intraoperative visualization of C3-7 anterior cervical fusion. Image
quality is markedly degraded by technique and body habitus.

## 2020-04-02 SURGERY — ANTERIOR CERVICAL DECOMPRESSION/DISCECTOMY FUSION 4 LEVELS
Anesthesia: General | Site: Spine Cervical

## 2020-04-02 MED ORDER — ACETAMINOPHEN 500 MG PO TABS
1000.0000 mg | ORAL_TABLET | Freq: Once | ORAL | Status: DC
  Filled 2020-04-02: qty 2

## 2020-04-02 MED ORDER — ACETAMINOPHEN 650 MG RE SUPP: 650.0000 mg | RECTAL | Status: DC | PRN

## 2020-04-02 MED ORDER — PANTOPRAZOLE SODIUM 40 MG PO TBEC
40.0000 mg | DELAYED_RELEASE_TABLET | Freq: Every day | ORAL | Status: DC
  Administered 2020-04-03: 40 mg via ORAL
  Filled 2020-04-02: qty 1

## 2020-04-02 MED ORDER — THROMBIN 5000 UNITS EX SOLR: OROMUCOSAL | Status: DC | PRN

## 2020-04-02 MED ORDER — CEFAZOLIN SODIUM-DEXTROSE 2-4 GM/100ML-% IV SOLN
2.0000 g | Freq: Three times a day (TID) | INTRAVENOUS | Status: AC
  Administered 2020-04-02 – 2020-04-03 (×2): 2 g via INTRAVENOUS
  Filled 2020-04-02 (×2): qty 100

## 2020-04-02 MED ORDER — CHLORHEXIDINE GLUCONATE 0.12 % MT SOLN
15.0000 mL | Freq: Once | OROMUCOSAL | Status: AC
  Administered 2020-04-02: 15 mL via OROMUCOSAL
  Filled 2020-04-02: qty 15

## 2020-04-02 MED ORDER — ROCURONIUM BROMIDE 10 MG/ML (PF) SYRINGE
PREFILLED_SYRINGE | INTRAVENOUS | Status: AC
  Filled 2020-04-02: qty 10

## 2020-04-02 MED ORDER — THROMBIN (RECOMBINANT) 5000 UNITS EX SOLR
CUTANEOUS | Status: AC
  Filled 2020-04-02: qty 5000

## 2020-04-02 MED ORDER — HYDROMORPHONE HCL 1 MG/ML IJ SOLN
0.5000 mg | INTRAMUSCULAR | Status: DC | PRN
  Administered 2020-04-02: 0.5 mg via INTRAVENOUS
  Filled 2020-04-02: qty 0.5

## 2020-04-02 MED ORDER — FLUTICASONE PROPIONATE 50 MCG/ACT NA SUSP: 2.0000 | Freq: Every day | NASAL | Status: DC | PRN

## 2020-04-02 MED ORDER — TIZANIDINE HCL 4 MG PO TABS: 2.0000 mg | ORAL_TABLET | Freq: Three times a day (TID) | ORAL | Status: DC | PRN

## 2020-04-02 MED ORDER — SERTRALINE HCL 50 MG PO TABS
150.0000 mg | ORAL_TABLET | Freq: Every day | ORAL | Status: DC
  Administered 2020-04-03: 150 mg via ORAL
  Filled 2020-04-02: qty 3

## 2020-04-02 MED ORDER — INSULIN ASPART 100 UNIT/ML ~~LOC~~ SOLN
0.0000 [IU] | Freq: Three times a day (TID) | SUBCUTANEOUS | Status: DC
  Administered 2020-04-02: 7 [IU] via SUBCUTANEOUS

## 2020-04-02 MED ORDER — ROCURONIUM BROMIDE 10 MG/ML (PF) SYRINGE
PREFILLED_SYRINGE | INTRAVENOUS | Status: DC | PRN
  Administered 2020-04-02 (×2): 30 mg via INTRAVENOUS
  Administered 2020-04-02: 70 mg via INTRAVENOUS
  Administered 2020-04-02: 30 mg via INTRAVENOUS
  Administered 2020-04-02: 20 mg via INTRAVENOUS

## 2020-04-02 MED ORDER — SODIUM CHLORIDE 0.9 % IV SOLN: 250.0000 mL | INTRAVENOUS | Status: DC

## 2020-04-02 MED ORDER — HYDROCODONE-ACETAMINOPHEN 7.5-325 MG PO TABS
1.0000 | ORAL_TABLET | Freq: Four times a day (QID) | ORAL | Status: DC | PRN
  Administered 2020-04-02 – 2020-04-03 (×4): 1 via ORAL
  Filled 2020-04-02 (×4): qty 1

## 2020-04-02 MED ORDER — DEXAMETHASONE SODIUM PHOSPHATE 10 MG/ML IJ SOLN: 10.0000 mg | Freq: Once | INTRAMUSCULAR | Status: DC

## 2020-04-02 MED ORDER — ONDANSETRON HCL 4 MG PO TABS: 4.0000 mg | ORAL_TABLET | Freq: Four times a day (QID) | ORAL | Status: DC | PRN

## 2020-04-02 MED ORDER — SODIUM CHLORIDE 0.9% FLUSH: 3.0000 mL | INTRAVENOUS | Status: DC | PRN

## 2020-04-02 MED ORDER — PROMETHAZINE HCL 25 MG/ML IJ SOLN: 6.2500 mg | INTRAMUSCULAR | Status: DC | PRN

## 2020-04-02 MED ORDER — THROMBIN 20000 UNITS EX SOLR: CUTANEOUS | Status: DC | PRN

## 2020-04-02 MED ORDER — HYDROMORPHONE HCL 1 MG/ML IJ SOLN
INTRAMUSCULAR | Status: AC
  Filled 2020-04-02: qty 0.5

## 2020-04-02 MED ORDER — ORAL CARE MOUTH RINSE: 15.0000 mL | Freq: Once | OROMUCOSAL | Status: AC

## 2020-04-02 MED ORDER — ONDANSETRON HCL 4 MG/2ML IJ SOLN
INTRAMUSCULAR | Status: AC
  Filled 2020-04-02: qty 2

## 2020-04-02 MED ORDER — LACTATED RINGERS IV SOLN: INTRAVENOUS | Status: DC

## 2020-04-02 MED ORDER — OXYCODONE HCL 5 MG PO TABS: 10.0000 mg | ORAL_TABLET | ORAL | Status: DC | PRN

## 2020-04-02 MED ORDER — ROPINIROLE HCL 0.5 MG PO TABS
0.7500 mg | ORAL_TABLET | Freq: Every day | ORAL | Status: DC
  Administered 2020-04-02: 0.75 mg via ORAL
  Filled 2020-04-02 (×2): qty 1

## 2020-04-02 MED ORDER — ALUM & MAG HYDROXIDE-SIMETH 200-200-20 MG/5ML PO SUSP: 30.0000 mL | Freq: Four times a day (QID) | ORAL | Status: DC | PRN

## 2020-04-02 MED ORDER — FENTANYL CITRATE (PF) 100 MCG/2ML IJ SOLN
25.0000 ug | INTRAMUSCULAR | Status: DC | PRN
  Administered 2020-04-02: 25 ug via INTRAVENOUS

## 2020-04-02 MED ORDER — FUROSEMIDE 20 MG PO TABS
30.0000 mg | ORAL_TABLET | Freq: Every day | ORAL | Status: DC
  Administered 2020-04-02 – 2020-04-03 (×2): 30 mg via ORAL
  Filled 2020-04-02 (×2): qty 2

## 2020-04-02 MED ORDER — ROSUVASTATIN CALCIUM 5 MG PO TABS
10.0000 mg | ORAL_TABLET | Freq: Every day | ORAL | Status: DC
  Administered 2020-04-03: 10 mg via ORAL
  Filled 2020-04-02: qty 2

## 2020-04-02 MED ORDER — THROMBIN 5000 UNITS EX KIT
PACK | CUTANEOUS | Status: AC
  Filled 2020-04-02: qty 1

## 2020-04-02 MED ORDER — OXYCODONE HCL 5 MG PO TABS: 5.0000 mg | ORAL_TABLET | Freq: Once | ORAL | Status: DC | PRN

## 2020-04-02 MED ORDER — MENTHOL 3 MG MT LOZG
1.0000 | LOZENGE | OROMUCOSAL | Status: DC | PRN
  Administered 2020-04-02: 3 mg via ORAL
  Filled 2020-04-02: qty 9

## 2020-04-02 MED ORDER — PROPOFOL 10 MG/ML IV BOLUS
INTRAVENOUS | Status: AC
  Filled 2020-04-02: qty 20

## 2020-04-02 MED ORDER — OXYCODONE HCL 5 MG/5ML PO SOLN
5.0000 mg | Freq: Once | ORAL | Status: DC | PRN
Start: 2020-04-02 — End: 2020-04-02

## 2020-04-02 MED ORDER — ACETAMINOPHEN 325 MG PO TABS
650.0000 mg | ORAL_TABLET | ORAL | Status: DC | PRN
  Administered 2020-04-02: 650 mg via ORAL
  Filled 2020-04-02: qty 2

## 2020-04-02 MED ORDER — ACETAMINOPHEN 325 MG PO TABS
ORAL_TABLET | ORAL | Status: AC
  Filled 2020-04-02: qty 2

## 2020-04-02 MED ORDER — FENTANYL CITRATE (PF) 100 MCG/2ML IJ SOLN
INTRAMUSCULAR | Status: DC | PRN
  Administered 2020-04-02: 100 ug via INTRAVENOUS
  Administered 2020-04-02 (×3): 50 ug via INTRAVENOUS

## 2020-04-02 MED ORDER — LIDOCAINE 2% (20 MG/ML) 5 ML SYRINGE
INTRAMUSCULAR | Status: AC
  Filled 2020-04-02: qty 5

## 2020-04-02 MED ORDER — LIDOCAINE 2% (20 MG/ML) 5 ML SYRINGE
INTRAMUSCULAR | Status: DC | PRN
  Administered 2020-04-02: 100 mg via INTRAVENOUS

## 2020-04-02 MED ORDER — VITAMIN D 25 MCG (1000 UNIT) PO TABS
4000.0000 [IU] | ORAL_TABLET | Freq: Every day | ORAL | Status: DC
  Administered 2020-04-02 – 2020-04-03 (×2): 4000 [IU] via ORAL
  Filled 2020-04-02 (×2): qty 4

## 2020-04-02 MED ORDER — BUPROPION HCL ER (SR) 150 MG PO TB12
150.0000 mg | ORAL_TABLET | Freq: Two times a day (BID) | ORAL | Status: DC
  Administered 2020-04-02 – 2020-04-03 (×2): 150 mg via ORAL
  Filled 2020-04-02 (×2): qty 1

## 2020-04-02 MED ORDER — PHENYLEPHRINE HCL-NACL 10-0.9 MG/250ML-% IV SOLN
INTRAVENOUS | Status: DC | PRN
  Administered 2020-04-02: 30 ug/min via INTRAVENOUS

## 2020-04-02 MED ORDER — THROMBIN 20000 UNITS EX SOLR
CUTANEOUS | Status: AC
  Filled 2020-04-02: qty 20000

## 2020-04-02 MED ORDER — ALBUTEROL SULFATE (2.5 MG/3ML) 0.083% IN NEBU: 2.5000 mg | INHALATION_SOLUTION | Freq: Four times a day (QID) | RESPIRATORY_TRACT | Status: DC | PRN

## 2020-04-02 MED ORDER — ONDANSETRON HCL 4 MG/2ML IJ SOLN: 4.0000 mg | Freq: Four times a day (QID) | INTRAMUSCULAR | Status: DC | PRN

## 2020-04-02 MED ORDER — DEXAMETHASONE SODIUM PHOSPHATE 10 MG/ML IJ SOLN
INTRAMUSCULAR | Status: DC | PRN
  Administered 2020-04-02: 4 mg via INTRAVENOUS

## 2020-04-02 MED ORDER — NEBIVOLOL HCL 10 MG PO TABS
20.0000 mg | ORAL_TABLET | Freq: Every day | ORAL | Status: DC
  Administered 2020-04-03: 20 mg via ORAL
  Filled 2020-04-02: qty 2

## 2020-04-02 MED ORDER — MIDAZOLAM HCL 5 MG/5ML IJ SOLN
INTRAMUSCULAR | Status: DC | PRN
  Administered 2020-04-02: 2 mg via INTRAVENOUS

## 2020-04-02 MED ORDER — FENTANYL CITRATE (PF) 250 MCG/5ML IJ SOLN
INTRAMUSCULAR | Status: AC
  Filled 2020-04-02: qty 5

## 2020-04-02 MED ORDER — SEMAGLUTIDE (1 MG/DOSE) 2 MG/1.5ML ~~LOC~~ SOPN: 1.0000 mg | PEN_INJECTOR | SUBCUTANEOUS | Status: DC

## 2020-04-02 MED ORDER — CYCLOBENZAPRINE HCL 10 MG PO TABS
10.0000 mg | ORAL_TABLET | Freq: Three times a day (TID) | ORAL | Status: DC | PRN
Start: 2020-04-02 — End: 2020-04-03
  Administered 2020-04-02 (×2): 10 mg via ORAL
  Filled 2020-04-02 (×2): qty 1

## 2020-04-02 MED ORDER — MIDAZOLAM HCL 2 MG/2ML IJ SOLN
INTRAMUSCULAR | Status: AC
  Filled 2020-04-02: qty 2

## 2020-04-02 MED ORDER — SUGAMMADEX SODIUM 200 MG/2ML IV SOLN
INTRAVENOUS | Status: DC | PRN
  Administered 2020-04-02: 200 mg via INTRAVENOUS

## 2020-04-02 MED ORDER — CHLORHEXIDINE GLUCONATE CLOTH 2 % EX PADS: 6.0000 | MEDICATED_PAD | Freq: Once | CUTANEOUS | Status: DC

## 2020-04-02 MED ORDER — LOSARTAN POTASSIUM 50 MG PO TABS
100.0000 mg | ORAL_TABLET | Freq: Every day | ORAL | Status: DC
  Administered 2020-04-02 – 2020-04-03 (×2): 100 mg via ORAL
  Filled 2020-04-02 (×2): qty 2

## 2020-04-02 MED ORDER — PROPOFOL 10 MG/ML IV BOLUS
INTRAVENOUS | Status: DC | PRN
  Administered 2020-04-02: 200 mg via INTRAVENOUS

## 2020-04-02 MED ORDER — ACETAMINOPHEN 325 MG PO TABS
650.0000 mg | ORAL_TABLET | Freq: Once | ORAL | Status: AC
  Administered 2020-04-02: 650 mg via ORAL

## 2020-04-02 MED ORDER — FENTANYL CITRATE (PF) 100 MCG/2ML IJ SOLN
INTRAMUSCULAR | Status: AC
  Filled 2020-04-02: qty 2

## 2020-04-02 MED ORDER — SERTRALINE HCL 50 MG PO TABS: 100.0000 mg | ORAL_TABLET | Freq: Every day | ORAL | Status: DC

## 2020-04-02 MED ORDER — 0.9 % SODIUM CHLORIDE (POUR BTL) OPTIME
TOPICAL | Status: DC | PRN
  Administered 2020-04-02: 1000 mL

## 2020-04-02 MED ORDER — HYDROMORPHONE HCL 1 MG/ML IJ SOLN
INTRAMUSCULAR | Status: DC | PRN
  Administered 2020-04-02: .5 mg via INTRAVENOUS

## 2020-04-02 MED ORDER — SODIUM CHLORIDE 0.9% FLUSH: 3.0000 mL | Freq: Two times a day (BID) | INTRAVENOUS | Status: DC

## 2020-04-02 MED ORDER — PANTOPRAZOLE SODIUM 40 MG IV SOLR: 40.0000 mg | Freq: Every day | INTRAVENOUS | Status: DC

## 2020-04-02 MED ORDER — PROMETHAZINE HCL 25 MG PO TABS: 12.5000 mg | ORAL_TABLET | Freq: Three times a day (TID) | ORAL | Status: DC | PRN

## 2020-04-02 MED ORDER — PHENOL 1.4 % MT LIQD
1.0000 | OROMUCOSAL | Status: DC | PRN
  Administered 2020-04-02: 1 via OROMUCOSAL
  Filled 2020-04-02: qty 177

## 2020-04-02 MED ORDER — HYDROXYZINE HCL 50 MG/ML IM SOLN
50.0000 mg | Freq: Four times a day (QID) | INTRAMUSCULAR | Status: DC | PRN
  Administered 2020-04-02: 50 mg via INTRAMUSCULAR
  Filled 2020-04-02: qty 1

## 2020-04-02 MED ORDER — FAMOTIDINE 20 MG PO TABS
20.0000 mg | ORAL_TABLET | Freq: Every day | ORAL | Status: DC
  Administered 2020-04-02: 20 mg via ORAL
  Filled 2020-04-02: qty 1

## 2020-04-02 MED ORDER — ONDANSETRON HCL 4 MG/2ML IJ SOLN
INTRAMUSCULAR | Status: DC | PRN
  Administered 2020-04-02: 4 mg via INTRAVENOUS

## 2020-04-02 SURGICAL SUPPLY — 69 items
ADH SKN CLS APL DERMABOND .7 (GAUZE/BANDAGES/DRESSINGS)
APL SKNCLS STERI-STRIP NONHPOA (GAUZE/BANDAGES/DRESSINGS) ×1
BAND INSRT 18 STRL LF DISP RB (MISCELLANEOUS) ×2
BAND RUBBER #18 3X1/16 STRL (MISCELLANEOUS) ×4 IMPLANT
BASKET BONE COLLECTION (BASKET) ×2 IMPLANT
BENZOIN TINCTURE PRP APPL 2/3 (GAUZE/BANDAGES/DRESSINGS) ×1 IMPLANT
BIT DRILL 13 (BIT) ×1 IMPLANT
BIT DRILL NEURO 2X3.1 SFT TUCH (MISCELLANEOUS) ×1 IMPLANT
BONE VIVIGEN FORMABLE 5.4CC (Bone Implant) ×2 IMPLANT
BUR MATCHSTICK NEURO 3.0 LAGG (BURR) ×2 IMPLANT
CANISTER SUCT 3000ML PPV (MISCELLANEOUS) ×2 IMPLANT
CARTRIDGE OIL MAESTRO DRILL (MISCELLANEOUS) ×1 IMPLANT
DERMABOND ADVANCED (GAUZE/BANDAGES/DRESSINGS)
DERMABOND ADVANCED .7 DNX12 (GAUZE/BANDAGES/DRESSINGS) IMPLANT
DIFFUSER DRILL AIR PNEUMATIC (MISCELLANEOUS) ×2 IMPLANT
DRAIN SNY 7 FPER (WOUND CARE) ×1 IMPLANT
DRAPE C-ARM 42X72 X-RAY (DRAPES) ×4 IMPLANT
DRAPE LAPAROTOMY 100X72 PEDS (DRAPES) ×2 IMPLANT
DRAPE MICROSCOPE LEICA (MISCELLANEOUS) ×2 IMPLANT
DRILL NEURO 2X3.1 SOFT TOUCH (MISCELLANEOUS) ×2
DRSG OPSITE POSTOP 4X6 (GAUZE/BANDAGES/DRESSINGS) ×1 IMPLANT
DURAPREP 6ML APPLICATOR 50/CS (WOUND CARE) ×2 IMPLANT
ELECT COATED BLADE 2.86 ST (ELECTRODE) ×2 IMPLANT
ELECT REM PT RETURN 9FT ADLT (ELECTROSURGICAL) ×2
ELECTRODE REM PT RTRN 9FT ADLT (ELECTROSURGICAL) ×1 IMPLANT
EVACUATOR SILICONE 100CC (DRAIN) ×1 IMPLANT
GAUZE 4X4 16PLY RFD (DISPOSABLE) IMPLANT
GAUZE SPONGE 4X4 12PLY STRL (GAUZE/BANDAGES/DRESSINGS) ×2 IMPLANT
GLOVE BIO SURGEON STRL SZ7 (GLOVE) ×2 IMPLANT
GLOVE BIO SURGEON STRL SZ8 (GLOVE) ×4 IMPLANT
GLOVE INDICATOR 8.5 STRL (GLOVE) ×2 IMPLANT
GLOVE SS BIOGEL STRL SZ 7.5 (GLOVE) IMPLANT
GLOVE SUPERSENSE BIOGEL SZ 7.5 (GLOVE) ×5
GLOVE SURG PR MICRO ENCORE 7 (GLOVE) ×3 IMPLANT
GOWN STRL REUS W/ TWL LRG LVL3 (GOWN DISPOSABLE) ×1 IMPLANT
GOWN STRL REUS W/ TWL XL LVL3 (GOWN DISPOSABLE) ×1 IMPLANT
GOWN STRL REUS W/TWL 2XL LVL3 (GOWN DISPOSABLE) IMPLANT
GOWN STRL REUS W/TWL LRG LVL3 (GOWN DISPOSABLE) ×4
GOWN STRL REUS W/TWL XL LVL3 (GOWN DISPOSABLE) ×8
GRAFT BNE MATRIX VG FRMBL MD 5 (Bone Implant) IMPLANT
HALTER HD/CHIN CERV TRACTION D (MISCELLANEOUS) ×2 IMPLANT
HEMOSTAT POWDER KIT SURGIFOAM (HEMOSTASIS) ×4 IMPLANT
KIT BASIN OR (CUSTOM PROCEDURE TRAY) ×2 IMPLANT
KIT TURNOVER KIT B (KITS) ×2 IMPLANT
NDL HYPO 18GX1.5 BLUNT FILL (NEEDLE) ×1 IMPLANT
NDL SPNL 20GX3.5 QUINCKE YW (NEEDLE) ×1 IMPLANT
NEEDLE HYPO 18GX1.5 BLUNT FILL (NEEDLE) ×2 IMPLANT
NEEDLE SPNL 20GX3.5 QUINCKE YW (NEEDLE) ×2 IMPLANT
NS IRRIG 1000ML POUR BTL (IV SOLUTION) ×2 IMPLANT
OIL CARTRIDGE MAESTRO DRILL (MISCELLANEOUS) ×2
PACK LAMINECTOMY NEURO (CUSTOM PROCEDURE TRAY) ×2 IMPLANT
PIN DISTRACTION 14MM (PIN) IMPLANT
PLATE 4 75XNS SPNE CVD ANT T (Plate) IMPLANT
PLATE 4 ATLANTIS TRANS (Plate) ×2 IMPLANT
SCREW 4.0X14 (Screw) ×1 IMPLANT
SCREW SPINAL 4.0X14MM TITANIUM (Screw) ×2 IMPLANT
SCREW ST 14X4XST FXANG SPNE (Screw) IMPLANT
SCREW ST FIX 4 ATL (Screw) ×18 IMPLANT
SPACER HEDRON C 12X14X6 0D (Spacer) ×2 IMPLANT
SPACER HEDRON C 12X14X7 0D (Spacer) ×2 IMPLANT
SPONGE INTESTINAL PEANUT (DISPOSABLE) ×2 IMPLANT
SPONGE SURGIFOAM ABS GEL 100 (HEMOSTASIS) ×2 IMPLANT
STRIP CLOSURE SKIN 1/2X4 (GAUZE/BANDAGES/DRESSINGS) ×2 IMPLANT
SUT VIC AB 3-0 SH 8-18 (SUTURE) ×2 IMPLANT
SUT VIC AB 4-0 PS2 27 (SUTURE) ×2 IMPLANT
TAPE CLOTH 4X10 WHT NS (GAUZE/BANDAGES/DRESSINGS) ×1 IMPLANT
TOWEL GREEN STERILE (TOWEL DISPOSABLE) ×2 IMPLANT
TOWEL GREEN STERILE FF (TOWEL DISPOSABLE) ×2 IMPLANT
WATER STERILE IRR 1000ML POUR (IV SOLUTION) ×2 IMPLANT

## 2020-04-02 NOTE — Anesthesia Postprocedure Evaluation (Signed)
Anesthesia Post Note  Patient: Tracy Daugherty  Procedure(s) Performed: CERVICAL THREE-FOUR, CERVICAL FOUR-FIVE, CERVICAL FIVE-SIX, CERVICAL SIX-SEVEN ANTERIOR CERVICAL DECOMPRESSION/DISCECTOMY FUSION (N/A Spine Cervical)     Patient location during evaluation: PACU Anesthesia Type: General Level of consciousness: awake and alert and oriented Pain management: pain level controlled Vital Signs Assessment: post-procedure vital signs reviewed and stable Respiratory status: spontaneous breathing, nonlabored ventilation and respiratory function stable Cardiovascular status: blood pressure returned to baseline Postop Assessment: no apparent nausea or vomiting Anesthetic complications: no   No complications documented.  Last Vitals:  Vitals:   04/02/20 1554 04/02/20 1622  BP: (!) 153/80 (!) 149/83  Pulse: 88 99  Resp: 17 20  Temp: 36.6 C 36.5 C  SpO2: 94% 97%    Last Pain:  Vitals:   04/02/20 1622  TempSrc: Oral  PainSc:                  Kaylyn Layer

## 2020-04-02 NOTE — Op Note (Signed)
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 both centrally and foraminally at C3-4, C4-5, C5-6, C6-7  Postoperative diagnosis: Same  Procedure: Anterior cervical discectomies and fusion at C3-4, C4-5, C5-6, C6-7 utilizing the globus titanium cages packed with locally harvested autograft mixed with vivigen and anterior cervical plating utilizing Atlantis translational plate  Surgeon: Jillyn Hidden Ainhoa Rallo  Assistant: Julien Girt  Anesthesia: General  EBL: Minimal  HPI: 60 year old female longstanding neck and bilateral shoulder and arm pain work-up revealed severe cervical spondylosis with kyphosis deformity and severe foraminal stenosis over C3-4, C4-5, C5-6, C6-7.  Due to patient's progression of clinical syndrome imaging findings and failed conservative treatment I recommended anterior cervical discectomies and fusion at those levels.  I extensively went over the risks and benefits of that operation with her as well as perioperative course expectations of outcome and alternatives to surgery and she understood and agreed to proceed forward.  Operative procedure: Patient was brought into the OR was due to general anesthesia positioned supine the neck in slight extension 5 pounds halter traction the right 7 neck was prepped and draped in routine sterile fashion preoperative x-ray localized the appropriate level so a curvilinear incision was made just off midline to the intraborder of the sternocleidomastoid and the superficial layer of the platysma was dissected out divided longitudinally.  The avascular plane between the sternomastoid and strap muscle was developed down to the prevertebral fascia and prevertebral fascia was dissected away with Kitners.  Intraoperative x-ray confirmed identification appropriate level so annulotomy's were made first at C3-4 and working at C3-4 C4-5 and C5-6 initially.  So all 3 disc bases were incised anterior ossified's were bitten  over the Leksell rongeur and a 2 and 3 Miller Kerrison punch.  All 3 disc base were drilled down capturing the bone shavings and mucus trap.  I did place Caspar distracting pins at C3-4 and under microscope illumination under aggressively under bit both endplates marched laterally identified both C4 pedicles and decompress both C4 nerve roots flush with the pedicle then squared off the endplates prepared the endplates and inserted a 6 mm 0 degree cage after it was packed with locally harvested autograft mixed.  Then reposition the retractor but in a similar fashion performed discectomies at C4-5 and C5-6 significant osteophytes were present aggressively under Bitton of both endplates both pedicles at C5 as well as C6 sequentially were identified on all IV nerve roots were skeletonized flush the pedicle.  I inserted a 7 mm graft for 5 to 6 mm at C5-6.  Then I would reposition the retractor to do C6-7 again pathology was large posterior osteophytes aggressively under biting and both C7 nerve roots to decompress and flush with pedicle.  After the end discectomy was no further stenosis centrally or foraminally I selected a 7 mm cage here inserted that 2 mm deep to the anterior to byline I took the smallest Atlantis translational plate and had to collapse down leaving only one area left for dynamic compression.  However placed 14 mm fixed angle screws I did reposition 2 screws at C4 that look like they were breaching the inferior endplate of C4.  But when all was said none all screws and plates and implants were in good position.  Wounds were copiously irrigated meticulous hemostasis was maintained additional bone graft been packed laterally to the cages and underneath the plate and then a placed a JP drain and closed wound in layers with Vicryl in the platysma and a running 4 subcuticular  Dermabond benzoin Steri-Strips and a sterile dressing was applied patient recovery in stable condition.  At the end the case all  needle count sponge counts were correct

## 2020-04-02 NOTE — Anesthesia Procedure Notes (Signed)
Procedure Name: Intubation Date/Time: 04/02/2020 10:27 AM Performed by: Sheppard Evens, CRNA Pre-anesthesia Checklist: Patient identified, Emergency Drugs available, Suction available and Patient being monitored Patient Re-evaluated:Patient Re-evaluated prior to induction Oxygen Delivery Method: Circle System Utilized Preoxygenation: Pre-oxygenation with 100% oxygen Induction Type: IV induction Ventilation: Mask ventilation without difficulty Laryngoscope Size: Glidescope Grade View: Grade I Tube type: Oral Tube size: 7.0 mm Number of attempts: 1 Airway Equipment and Method: Stylet and Oral airway Placement Confirmation: ETT inserted through vocal cords under direct vision,  positive ETCO2 and breath sounds checked- equal and bilateral Secured at: 21 cm Tube secured with: Tape Dental Injury: Teeth and Oropharynx as per pre-operative assessment

## 2020-04-02 NOTE — H&P (Signed)
Tracy Daugherty is an 60 y.o. female.   Chief Complaint: Neck pain left greater than right arm pain HPI: 60 year old female progressive worsening neck pain left greater than right arm pain numbness tingling weakness in her hands work-up revealed severe cervical spinal stenosis with severe foraminal stenosis at C3-4, C4-5, C5-6, C6-7.  Due to patient progression of clinical syndrome imaging findings and failed conservative treatment of recommended anterior cervical discectomy and fusion at those levels.  I extensively reviewed the risks and benefits of the operation with her as well as perioperative course expectations of outcome and alternatives to surgery and they understand and agree to proceed forward.  Past Medical History:  Diagnosis Date  . Arthritis    knees, ankles  . Bronchitis    uses inhaler prn  . Depression   . Diabetes mellitus without complication (HCC)    type 2  . GERD (gastroesophageal reflux disease)   . Headache    controlled with meds  . Hepatitis 2014   Hep C - was treated  . HLD (hyperlipidemia)   . Hypertension   . Hyperthyroidism   . RLL pneumonia    x 1  . Seasonal allergies   . Sleep apnea    uses cpap nightly  . Wears glasses   . Wears partial dentures    upper    Past Surgical History:  Procedure Laterality Date  . ABDOMINAL HYSTERECTOMY    . ANTERIOR (CYSTOCELE) AND POSTERIOR REPAIR (RECTOCELE) WITH XENFORM GRAFT AND SACROSPINOUS FIXATION  2013  . CHOLECYSTECTOMY    . COLONOSCOPY    . right sided transverse abdominas plane block under ultrasound guidance  2018  . TUBAL LIGATION    . UPPER GI ENDOSCOPY    . WISDOM TOOTH EXTRACTION  1980s    History reviewed. No pertinent family history. Social History:  reports that she has never smoked. She has never used smokeless tobacco. She reports previous alcohol use. She reports previous drug use.  Allergies:  Allergies  Allergen Reactions  . Gabapentin Itching  . Meloxicam Itching  . Other  Itching    tomatoes    Medications Prior to Admission  Medication Sig Dispense Refill  . albuterol (VENTOLIN HFA) 108 (90 Base) MCG/ACT inhaler Inhale 2 puffs into the lungs every 6 (six) hours as needed for wheezing or shortness of breath.    Marland Kitchen aspirin EC 81 MG tablet Take 81 mg by mouth daily. Swallow whole.    Marland Kitchen buPROPion (WELLBUTRIN SR) 150 MG 12 hr tablet Take 150 mg by mouth 2 (two) times daily.    . Cholecalciferol (VITAMIN D) 50 MCG (2000 UT) tablet Take 4,000 Units by mouth daily.    . famotidine (PEPCID) 20 MG tablet Take 20 mg by mouth at bedtime.    . fluticasone (FLONASE) 50 MCG/ACT nasal spray Place 2 sprays into both nostrils daily as needed for allergies or rhinitis.    . furosemide (LASIX) 20 MG tablet Take 30 mg by mouth daily.    Marland Kitchen HYDROcodone-acetaminophen (NORCO) 7.5-325 MG tablet Take 1 tablet by mouth every 6 (six) hours as needed for moderate pain.    Marland Kitchen losartan (COZAAR) 100 MG tablet Take 100 mg by mouth daily.    . Nebivolol HCl 20 MG TABS Take 20 mg by mouth daily.    Marland Kitchen omeprazole (PRILOSEC) 40 MG capsule Take 40 mg by mouth daily.    . promethazine (PHENERGAN) 12.5 MG tablet Take 12.5 mg by mouth every 8 (eight) hours as needed  for nausea or vomiting.    Marland Kitchen rOPINIRole (REQUIP) 0.25 MG tablet Take 0.75 mg by mouth at bedtime.    . rosuvastatin (CRESTOR) 10 MG tablet Take 10 mg by mouth daily.    . Semaglutide, 1 MG/DOSE, (OZEMPIC, 1 MG/DOSE,) 2 MG/1.5ML SOPN Inject 1 mg into the skin every Wednesday.    . sertraline (ZOLOFT) 100 MG tablet Take 100 mg by mouth daily.    . sertraline (ZOLOFT) 50 MG tablet Take 50 mg by mouth daily.    Marland Kitchen tiZANidine (ZANAFLEX) 4 MG tablet Take 2-4 mg by mouth every 8 (eight) hours as needed for muscle spasms.      Results for orders placed or performed during the hospital encounter of 04/02/20 (from the past 48 hour(s))  Glucose, capillary     Status: Abnormal   Collection Time: 04/02/20  7:06 AM  Result Value Ref Range    Glucose-Capillary 140 (H) 70 - 99 mg/dL    Comment: Glucose reference range applies only to samples taken after fasting for at least 8 hours.  SARS Coronavirus 2 by RT PCR (hospital order, performed in [facility] hospital lab) Nasopharyngeal Nasopharyngeal Swab     Status: None   Collection Time: 04/02/20  7:15 AM   Specimen: Nasopharyngeal Swab  Result Value Ref Range   SARS Coronavirus 2 NEGATIVE NEGATIVE    Comment: (NOTE) SARS-CoV-2 target nucleic acids are NOT DETECTED.  The SARS-CoV-2 RNA is generally detectable in upper and lower respiratory specimens during the acute phase of infection. The lowest concentration of SARS-CoV-2 viral copies this assay can detect is 250 copies / mL. A negative result does not preclude SARS-CoV-2 infection and should not be used as the sole basis for treatment or other patient management decisions.  A negative result may occur with improper specimen collection / handling, submission of specimen other than nasopharyngeal swab, presence of viral mutation(s) within the areas targeted by this assay, and inadequate number of viral copies (<250 copies / mL). A negative result must be combined with clinical observations, patient history, and epidemiological information.  Fact Sheet for Patients:   BoilerBrush.com.cy  Fact Sheet for Healthcare Providers: https://pope.com/  This test is not yet approved or  cleared by the [place] FDA and has been authorized for detection and/or diagnosis of SARS-CoV-2 by FDA under an Emergency Use Authorization (EUA).  This EUA will remain in effect (meaning this test can be used) for the duration of the COVID-19 declaration under Section 564(b)(1) of the Act, 21 U.S.C. section 360bbb-3(b)(1), unless the authorization is terminated or revoked sooner.  Performed at [facility], [address] [place], [place] [address]   ABO/Rh     Status: None    Collection Time: 04/02/20  7:30 AM  Result Value Ref Range   ABO/RH(D)      AB POS Performed at [facility], [address] [place], [place] [address]    No results found.  Review of Systems  Blood pressure (!) 170/103, pulse 84, temperature 98.1 F (36.7 C), temperature source Oral, resp. rate 18, height 5\' 8"  (1.727 m), weight 132.9 kg, SpO2 98 %. Physical Exam   Assessment/Plan 60 year old presents for anterior cervical discectomies and fusion over C3-4, C4-5, C5-6, C6-7.  46, MD 04/02/2020, 10:00 AM

## 2020-04-02 NOTE — Transfer of Care (Signed)
Immediate Anesthesia Transfer of Care Note  Patient: Tracy Daugherty  Procedure(s) Performed: CERVICAL THREE-FOUR, CERVICAL FOUR-FIVE, CERVICAL FIVE-SIX, CERVICAL SIX-SEVEN ANTERIOR CERVICAL DECOMPRESSION/DISCECTOMY FUSION (N/A Spine Cervical)  Patient Location: PACU  Anesthesia Type:General  Level of Consciousness: responds to stimulation  Airway & Oxygen Therapy: Patient Spontanous Breathing and Patient connected to face mask oxygen  Post-op Assessment: Report given to RN and Post -op Vital signs reviewed and stable  Post vital signs: Reviewed and stable  Last Vitals:  Vitals Value Taken Time  BP 148/81 04/02/20 1440  Temp    Pulse 97 04/02/20 1440  Resp 19 04/02/20 1440  SpO2 98 % 04/02/20 1440  Vitals shown include unvalidated device data.  Last Pain:  Vitals:   04/02/20 0720  TempSrc:   PainSc: 10-Worst pain ever      Patients Stated Pain Goal: 2 (04/02/20 0720)  Complications: No complications documented.

## 2020-04-03 ENCOUNTER — Encounter (HOSPITAL_COMMUNITY): Payer: Self-pay | Admitting: Neurosurgery

## 2020-04-03 LAB — GLUCOSE, CAPILLARY: Glucose-Capillary: 119 mg/dL — ABNORMAL HIGH (ref 70–99)

## 2020-04-03 MED ORDER — METHOCARBAMOL 500 MG PO TABS: 500.0000 mg | ORAL_TABLET | Freq: Four times a day (QID) | ORAL | 0 refills | Status: AC

## 2020-04-03 MED ORDER — OXYCODONE-ACETAMINOPHEN 5-325 MG PO TABS: 1.0000 | ORAL_TABLET | ORAL | 0 refills | Status: AC | PRN

## 2020-04-03 MED FILL — Thrombin (Recombinant) For Soln 5000 Unit: CUTANEOUS | Qty: 5000 | Status: AC

## 2020-04-03 MED FILL — Thrombin For Soln Kit 5000 Unit: CUTANEOUS | Qty: 1 | Status: AC

## 2020-04-03 NOTE — Evaluation (Signed)
Occupational Therapy Evaluation Patient Details Name: Tracy Daugherty MRN: [id number] DOB: Aug 12, 1960 Today's Date: 04/03/2020    History of Present Illness Pt is a 59 y/o female who presents s/p C3-C7 ACDF on 04/02/2020. PMH significant for RLL PNA, hyperthyroidism, HTN, hepatitis C - treated, DM.   Clinical Impression   PTA patient independent with ADLs, mobility using cane at times. Admitted for above and limited by problem list below, including pain, precautions, increased body habitus, balance, mild decreased FMC of BUEs.  Patient educated on brace and wear schedule, activity progression/energy conservation, precautions, ADL compensatory techniques, AE/DME. Patient requires up to min assist for ADLs, completing transfers and in room mobility with supervision without AD.  Educated on use of reacher/sock aide/long sponge/toilet aide for self care tasks, plans to purchase as needed. Will have 24/7 support from family.  Based on performance today, no further OT needs have been identified and OT will sign off. Thank you for this referral.     Follow Up Recommendations  No OT follow up;Supervision/Assistance - 24 hour    Equipment Recommendations  None recommended by OT    Recommendations for Other Services       Precautions / Restrictions Precautions Precautions: Fall;Cervical Precaution Booklet Issued: Yes (comment) Precaution Comments: reviewed precautions, good recall and adherence functionally Required Braces or Orthoses: Cervical Brace Cervical Brace: Soft collar Restrictions Weight Bearing Restrictions: No      Mobility Bed Mobility Overal bed mobility: Needs Assistance Bed Mobility: Sidelying to Sit;Rolling Rolling: Modified independent (Device/Increase time) Sidelying to sit: Supervision       General bed mobility comments: OOB upon entry in recliner    Transfers Overall transfer level: Needs assistance   Transfers: Sit to/from Stand Sit to Stand: Supervision          General transfer comment: for safety, cueing for posture    Balance Overall balance assessment: Needs assistance Sitting-balance support: Feet supported;No upper extremity supported Sitting balance-Leahy Scale: Fair     Standing balance support: No upper extremity supported;During functional activity Standing balance-Leahy Scale: Fair                             ADL either performed or assessed with clinical judgement   ADL Overall ADL's : Needs assistance/impaired     Grooming: Modified independent;Standing;Cueing for compensatory techniques   Upper Body Bathing: Set up;Supervision/ safety;Sitting   Lower Body Bathing: Minimal assistance;Sit to/from stand;Cueing for compensatory techniques Lower Body Bathing Details (indicate cue type and reason): plans to use long sponge Upper Body Dressing : Set up;Supervision/safety;Sitting;Cueing for compensatory techniques   Lower Body Dressing: Minimal assistance;Sit to/from stand;With adaptive equipment;Cueing for compensatory techniques;Cueing for back precautions Lower Body Dressing Details (indicate cue type and reason): used sock aide/ reacher, able to return demonstrate. sit to stand supervision Toilet Transfer: Supervision/safety;Ambulation     Toileting - Clothing Manipulation Details (indicate cue type and reason): reviewed techniques and use of AE for toileting     Functional mobility during ADLs: Supervision/safety General ADL Comments: pt educated on AE, precautions, compensatory techniques for ADls and recommendations     Vision         Perception     Praxis      Pertinent Vitals/Pain Pain Assessment: Faces Faces Pain Scale: Hurts little more Pain Location: Bilateral shoulders and neck (incision site) Pain Descriptors / Indicators: Operative site guarding;Sore;Heaviness;Aching Pain Intervention(s): Limited activity within patient's tolerance;Monitored during session;Repositioned     Hand  Dominance Right   Extremity/Trunk Assessment Upper Extremity Assessment Upper Extremity Assessment: Overall WFL for tasks assessed (mild decreased FMC but functional)   Lower Extremity Assessment Lower Extremity Assessment: Defer to PT evaluation   Cervical / Trunk Assessment Cervical / Trunk Assessment: Other exceptions Cervical / Trunk Exceptions: s/p surgery   Communication Communication Communication: No difficulties   Cognition Arousal/Alertness: Awake/alert Behavior During Therapy: WFL for tasks assessed/performed Overall Cognitive Status: Within Functional Limits for tasks assessed                                     General Comments       Exercises     Shoulder Instructions      Home Living Family/patient expects to be discharged to:: Private residence Living Arrangements: Children Available Help at Discharge: Family;Available 24 hours/day (between son and niece) Type of Home: House Home Access: Stairs to enter [employer] of Steps: 3   Home Layout: One level     Bathroom Shower/Tub: Chief Strategy Officer: Standard     Home Equipment: Environmental consultant - 2 wheels;Cane - single point;Shower seat          Prior Functioning/Environment Level of Independence: Independent with assistive device(s)        Comments: Occasionally used SPC when "feeling dizzy" but was mostly independent. Not working but drives.        OT Problem List: Decreased coordination;Decreased knowledge of use of DME or AE;Decreased knowledge of precautions;Obesity;Pain;Impaired balance (sitting and/or standing)      OT Treatment/Interventions:      OT Goals(Current goals can be found in the care plan section) Acute Rehab OT Goals Patient Stated Goal: Home at d/c OT Goal Formulation: With patient  OT Frequency:     Barriers to D/C:            Co-evaluation              AM-PAC OT "6 Clicks" Daily Activity     Outcome Measure Help from  another person eating meals?: None Help from another person taking care of personal grooming?: A Little Help from another person toileting, which includes using toliet, bedpan, or urinal?: A Little Help from another person bathing (including washing, rinsing, drying)?: A Little Help from another person to put on and taking off regular upper body clothing?: A Little Help from another person to put on and taking off regular lower body clothing?: A Little 6 Click Score: 19   End of Session Equipment Utilized During Treatment: Cervical collar Nurse Communication: Mobility status  Activity Tolerance: Patient tolerated treatment well Patient left: in chair;with call bell/phone within reach  OT Visit Diagnosis: Other abnormalities of gait and mobility (R26.89);Pain;Other symptoms and signs involving the nervous system (R29.898) Pain - part of body:  (neck, bil shoulders)                Time: 1017-5102 OT Time Calculation (min): 24 min Charges:  OT General Charges $OT Visit: 1 Visit OT Evaluation $OT Eval Low Complexity: 1 Low OT Treatments $Self Care/Home Management : 8-22 mins  Barry Brunner, OT Acute Rehabilitation Services Pager [phone] Office [phone]   Chancy Milroy 04/03/2020, 11:18 AM

## 2020-04-03 NOTE — Discharge Summary (Signed)
Physician Discharge Summary  Patient ID: Tracy Daugherty MRN: [id number] DOB/AGE: [birth year] 59 y.o.  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 The patient tolerated the procedure well and was taken to the recovery room and then to the floor in stable condition. The hospital course was routine. There were no complications. The wound remained clean dry and intact. Pt had appropriate neck soreness. No complaints of arm pain or new N/T/W. The patient remained afebrile with stable vital signs, and tolerated a regular diet. The patient continued to increase activities, and pain was well controlled with oral pain medications.   Consults: None  Significant Diagnostic Studies:  Results for orders placed or performed during the hospital encounter of 04/02/20  SARS Coronavirus 2 by RT PCR (hospital order, performed in [facility] hospital lab) Nasopharyngeal Nasopharyngeal Swab   Specimen: Nasopharyngeal Swab  Result Value Ref Range   SARS Coronavirus 2 NEGATIVE NEGATIVE  Glucose, capillary  Result Value Ref Range   Glucose-Capillary 140 (H) 70 - 99 mg/dL  Glucose, capillary  Result Value Ref Range   Glucose-Capillary 190 (H) 70 - 99 mg/dL   Comment 1 Notify RN   Glucose, capillary  Result Value Ref Range   Glucose-Capillary 230 (H) 70 - 99 mg/dL   Comment 1 Notify RN    Comment 2 Document in Chart   Glucose, capillary  Result Value Ref Range   Glucose-Capillary 218 (H) 70 - 99 mg/dL   Comment 1 Notify RN    Comment 2 Document in Chart   Glucose, capillary  Result Value Ref Range   Glucose-Capillary 119 (H) 70 - 99 mg/dL   Comment 1 Notify RN     Comment 2 Document in Chart   ABO/Rh  Result Value Ref Range   ABO/RH(D)      AB POS Performed at [facility], [address], [place], [place] [address]     DG Cervical Spine 2-3 Views  Result Date: 04/02/2020 CLINICAL DATA:  ACDF C3-7. EXAM: CERVICAL SPINE - 2-3 VIEW; DG C-ARM 1-60 MIN COMPARISON:  MR cervical spine 02/03/2020. FINDINGS: Two intraoperative fluoroscopic spot views of the cervical spine are provided in the lateral projection. Osseous detail is markedly degraded by technique and the patient's shoulders. Patient status post C3-7 anterior cervical fusion with interbody spacers. IMPRESSION: Intraoperative visualization of C3-7 anterior cervical fusion. Image quality is markedly degraded by technique and body habitus. Electronically Signed   By: Leanna Battles M.D.   On: 04/02/2020 14:35   DG C-Arm 1-60 Min  Result Date: 04/02/2020 CLINICAL DATA:  ACDF C3-7. EXAM: CERVICAL SPINE - 2-3 VIEW; DG C-ARM 1-60 MIN COMPARISON:  MR cervical spine 02/03/2020. FINDINGS: Two intraoperative fluoroscopic spot views of the cervical spine are provided in the lateral projection. Osseous detail is markedly degraded by technique and the patient's shoulders. Patient status post C3-7 anterior cervical fusion with interbody spacers. IMPRESSION: Intraoperative visualization of C3-7 anterior cervical fusion. Image quality is markedly degraded by technique and body habitus. Electronically Signed   By: Leanna Battles M.D.   On: 04/02/2020 14:35    Antibiotics:  Anti-infectives (From admission, onward)   Start     Dose/Rate Route Frequency Ordered Stop  04/02/20 1800  ceFAZolin (ANCEF) IVPB 2g/100 mL premix        2 g 200 mL/hr over 30 Minutes Intravenous Every 8 hours 04/02/20 1619 04/03/20 0313   04/02/20 0600  ceFAZolin (ANCEF) 3 g in dextrose 5 % 50 mL IVPB        3 g 100 mL/hr over 30 Minutes Intravenous On call to O.R. 03/30/20 0846 04/02/20 1643      Discharge Exam: Blood pressure  (!) 146/81, pulse 77, temperature 98.3 F (36.8 C), temperature source Oral, resp. rate 18, height 5\' 8"  (1.727 m), weight 132.9 kg, SpO2 99 %. Neurologic: Grossly normal Ambulating and voiding well, incision cdi  Discharge Medications:   Allergies as of 04/03/2020      Reactions   Gabapentin Itching   Meloxicam Itching   Other Itching   tomatoes      Medication List    TAKE these medications   albuterol 108 (90 Base) MCG/ACT inhaler Commonly known as: VENTOLIN HFA Inhale 2 puffs into the lungs every 6 (six) hours as needed for wheezing or shortness of breath.   aspirin EC 81 MG tablet Take 81 mg by mouth daily. Swallow whole.   buPROPion 150 MG 12 hr tablet Commonly known as: WELLBUTRIN SR Take 150 mg by mouth 2 (two) times daily.   famotidine 20 MG tablet Commonly known as: PEPCID Take 20 mg by mouth at bedtime.   fluticasone 50 MCG/ACT nasal spray Commonly known as: FLONASE Place 2 sprays into both nostrils daily as needed for allergies or rhinitis.   furosemide 20 MG tablet Commonly known as: LASIX Take 30 mg by mouth daily.   HYDROcodone-acetaminophen 7.5-325 MG tablet Commonly known as: NORCO Take 1 tablet by mouth every 6 (six) hours as needed for moderate pain.   losartan 100 MG tablet Commonly known as: COZAAR Take 100 mg by mouth daily.   methocarbamol 500 MG tablet Commonly known as: Robaxin Take 1 tablet (500 mg total) by mouth 4 (four) times daily.   Nebivolol HCl 20 MG Tabs Take 20 mg by mouth daily.   omeprazole 40 MG capsule Commonly known as: PRILOSEC Take 40 mg by mouth daily.   oxyCODONE-acetaminophen 5-325 MG tablet Commonly known as: Percocet Take 1 tablet by mouth every 4 (four) hours as needed for severe pain.   Ozempic (1 MG/DOSE) 2 MG/1.5ML Sopn Generic drug: Semaglutide (1 MG/DOSE) Inject 1 mg into the skin every Wednesday.   promethazine 12.5 MG tablet Commonly known as: PHENERGAN Take 12.5 mg by mouth every 8 (eight)  hours as needed for nausea or vomiting.   rOPINIRole 0.25 MG tablet Commonly known as: REQUIP Take 0.75 mg by mouth at bedtime.   rosuvastatin 10 MG tablet Commonly known as: CRESTOR Take 10 mg by mouth daily.   sertraline 100 MG tablet Commonly known as: ZOLOFT Take 100 mg by mouth daily.   sertraline 50 MG tablet Commonly known as: ZOLOFT Take 50 mg by mouth daily.   tiZANidine 4 MG tablet Commonly known as: ZANAFLEX Take 2-4 mg by mouth every 8 (eight) hours as needed for muscle spasms.   Vitamin D 50 MCG (2000 UT) tablet Take 4,000 Units by mouth daily.       Disposition: home   Final Dx: acdf C3-C7  Discharge Instructions     Remove dressing in 72 hours   Complete by: As directed    Call MD for:  difficulty breathing, headache or visual disturbances   Complete by: As directed  Call MD for:  hives   Complete by: As directed    Call MD for:  persistant dizziness or light-headedness   Complete by: As directed    Call MD for:  persistant nausea and vomiting   Complete by: As directed    Call MD for:  redness, tenderness, or signs of infection (pain, swelling, redness, odor or green/yellow discharge around incision site)   Complete by: As directed    Call MD for:  severe uncontrolled pain   Complete by: As directed    Call MD for:  temperature >100.4   Complete by: As directed    Diet - low sodium heart healthy   Complete by: As directed    Driving Restrictions   Complete by: As directed    No driving for 2 weeks, no riding in the car for 1 week   Increase activity slowly   Complete by: As directed    Lifting restrictions   Complete by: As directed    No lifting more than 8 lbs         Signed: Tiana Loft  04/03/2020, 11:24 AM

## 2020-04-03 NOTE — Plan of Care (Signed)
Pt doing well. Pt given D/C instructions with verbal understanding. Rx's were sent to the pharmacy by MD. Pt's incision is clean and dry with no sign of infection. Pt's IV and JP drain were removed prior to D/C.  Pt D/C'd home via wheelchair per MD order. Pt is stable @ D/C and has no other needs at this time. Madylyn Insco, RN  

## 2020-04-03 NOTE — Progress Notes (Signed)
Subjective: Patient reports moderate posterior neck pain. Walking well.   Objective: Vital signs in last 24 hours: Temp:  [97 F (36.1 C)-98 F (36.7 C)] 97.7 F (36.5 C) (03/15 0350) Pulse Rate:  [84-101] 84 (03/15 0350) Resp:  [17-28] 20 (03/15 0350) BP: (137-166)/(65-96) 141/65 (03/15 0350) SpO2:  [94 %-100 %] 98 % (03/15 0350)  Intake/Output from previous day: 03/14 0701 - 03/15 0700 In: 1850 [I.V.:1600; IV Piggyback:250] Out: 1066 [Urine:501; Emesis/NG output:250; Drains:15; Blood:300] Intake/Output this shift: No intake/output data recorded.  Neurologic: Grossly normal  Lab Results: Lab Results  Component Value Date   WBC 5.7 03/28/2020   HGB 13.8 03/28/2020   HCT 42.7 03/28/2020   MCV 88.8 03/28/2020   PLT 225 03/28/2020   No results found for: INR, PROTIME BMET Lab Results  Component Value Date   NA 138 03/28/2020   K 3.9 03/28/2020   CL 105 03/28/2020   CO2 24 03/28/2020   GLUCOSE 114 (H) 03/28/2020   BUN 7 03/28/2020   CREATININE 0.92 03/28/2020   CALCIUM 9.3 03/28/2020    Studies/Results: DG Cervical Spine 2-3 Views  Result Date: 04/02/2020 CLINICAL DATA:  ACDF C3-7. EXAM: CERVICAL SPINE - 2-3 VIEW; DG C-ARM 1-60 MIN COMPARISON:  MR cervical spine 02/03/2020. FINDINGS: Two intraoperative fluoroscopic spot views of the cervical spine are provided in the lateral projection. Osseous detail is markedly degraded by technique and the patient's shoulders. Patient status post C3-7 anterior cervical fusion with interbody spacers. IMPRESSION: Intraoperative visualization of C3-7 anterior cervical fusion. Image quality is markedly degraded by technique and body habitus. Electronically Signed   By: Leanna Battles M.D.   On: 04/02/2020 14:35   DG C-Arm 1-60 Min  Result Date: 04/02/2020 CLINICAL DATA:  ACDF C3-7. EXAM: CERVICAL SPINE - 2-3 VIEW; DG C-ARM 1-60 MIN COMPARISON:  MR cervical spine 02/03/2020. FINDINGS: Two intraoperative fluoroscopic spot views of the  cervical spine are provided in the lateral projection. Osseous detail is markedly degraded by technique and the patient's shoulders. Patient status post C3-7 anterior cervical fusion with interbody spacers. IMPRESSION: Intraoperative visualization of C3-7 anterior cervical fusion. Image quality is markedly degraded by technique and body habitus. Electronically Signed   By: Leanna Battles M.D.   On: 04/02/2020 14:35    Assessment/Plan: Postop day 1 4 level acdf doing well. Incision cdi, will work with therapy today and see how she does.   LOS: 1 day    Tracy Daugherty Tracy Daugherty 04/03/2020, 7:52 AM

## 2020-04-03 NOTE — Evaluation (Signed)
Physical Therapy Evaluation Patient Details Name: Tracy Daugherty MRN: [id number] DOB: 1960/07/09 Today's Date: 04/03/2020   History of Present Illness  Pt is a 60 y/o female who presents s/p C3-C7 ACDF on 04/02/2020. PMH significant for RLL PNA, hyperthyroidism, HTN, hepatitis C - treated, DM.    Clinical Impression  Pt admitted with above diagnosis. At the time of PT eval, pt was able to demonstrate transfers and ambulation with gross supervision for safety and no AD. Pt was educated on precautions, brace application/wearing schedule, appropriate activity progression, and car transfer. Pt currently with functional limitations due to the deficits listed below (see PT Problem List). Pt will benefit from skilled PT to increase their independence and safety with mobility to allow discharge to the venue listed below.         Follow Up Recommendations No PT follow up;Supervision - Intermittent    Equipment Recommendations  None recommended by PT    Recommendations for Other Services       Precautions / Restrictions Precautions Precautions: Fall;Cervical Precaution Booklet Issued: Yes (comment) Precaution Comments: Reviewed handout and pt was cued for precautions during functional mobility. Required Braces or Orthoses: Cervical Brace Cervical Brace: Soft collar Restrictions Weight Bearing Restrictions: No      Mobility  Bed Mobility Overal bed mobility: Needs Assistance Bed Mobility: Sidelying to Sit;Rolling Rolling: Modified independent (Device/Increase time) Sidelying to sit: Supervision       General bed mobility comments: HOB slightly elevated and rails lowered to simulate home environment. Supervision to ensure proper log roll technique.    Transfers Overall transfer level: Needs assistance   Transfers: Sit to/from Stand Sit to Stand: Supervision         General transfer comment: Light supervision for safety. No assist required, cues for improved  posture.  Ambulation/Gait Ambulation/Gait assistance: Supervision Gait Distance (Feet): 250 Feet Assistive device: None Gait Pattern/deviations: Step-through pattern;Decreased stride length;Trunk flexed Gait velocity: Decreased Gait velocity interpretation: <1.31 ft/sec, indicative of household ambulator General Gait Details: VC's for general safety. Overall safe with light supervision for posture reminders.  Stairs Stairs: Yes Stairs assistance: Min guard Stair Management: One rail Right;Step to pattern;Forwards Number of Stairs: 5 General stair comments: VC's for sequencing and safety.  Wheelchair Mobility    Modified Rankin (Stroke Patients Only)       Balance Overall balance assessment: Needs assistance Sitting-balance support: Feet supported;No upper extremity supported Sitting balance-Leahy Scale: Fair     Standing balance support: No upper extremity supported;During functional activity Standing balance-Leahy Scale: Fair                               Pertinent Vitals/Pain Pain Assessment: Faces Faces Pain Scale: Hurts little more Pain Location: Bilateral shoulders and neck (incision site) Pain Descriptors / Indicators: Operative site guarding;Sore;Heaviness;Aching Pain Intervention(s): Limited activity within patient's tolerance;Monitored during session;Repositioned    Home Living Family/patient expects to be discharged to:: Private residence Living Arrangements: Children Available Help at Discharge: Family;Available 24 hours/day (Between son and niece) Type of Home: House Home Access: Stairs to enter   [employer] of Steps: 3 Home Layout: One level Home Equipment: Walker - 2 wheels;Cane - single point;Shower seat      Prior Function Level of Independence: Independent with assistive device(s)         Comments: Occasionally used SPC when "feeling dizzy" but was mostly independent.     Hand Dominance  Extremity/Trunk Assessment   Upper Extremity Assessment Upper Extremity Assessment: Defer to OT evaluation    Lower Extremity Assessment Lower Extremity Assessment: Overall WFL for tasks assessed    Cervical / Trunk Assessment Cervical / Trunk Assessment: Other exceptions Cervical / Trunk Exceptions: s/p surgery  Communication   Communication: No difficulties  Cognition Arousal/Alertness: Awake/alert Behavior During Therapy: WFL for tasks assessed/performed Overall Cognitive Status: Within Functional Limits for tasks assessed                                        General Comments      Exercises     Assessment/Plan    PT Assessment Patient needs continued PT services  PT Problem List Decreased strength;Decreased activity tolerance;Decreased balance;Decreased mobility;Decreased knowledge of use of DME;Decreased safety awareness;Decreased knowledge of precautions;Pain       PT Treatment Interventions DME instruction;Gait training;Stair training;Functional mobility training;Therapeutic activities;Therapeutic exercise;Neuromuscular re-education;Patient/family education    PT Goals (Current goals can be found in the Care Plan section)  Acute Rehab PT Goals Patient Stated Goal: Home at d/c PT Goal Formulation: With patient Time For Goal Achievement: 04/10/20 Potential to Achieve Goals: Good    Frequency Min 5X/week   Barriers to discharge        Co-evaluation               AM-PAC PT "6 Clicks" Mobility  Outcome Measure Help needed turning from your back to your side while in a flat bed without using bedrails?: None Help needed moving from lying on your back to sitting on the side of a flat bed without using bedrails?: None Help needed moving to and from a bed to a chair (including a wheelchair)?: A Little Help needed standing up from a chair using your arms (e.g., wheelchair or bedside chair)?: A Little Help needed to walk in hospital room?: A  Little Help needed climbing 3-5 steps with a railing? : A Little 6 Click Score: 20    End of Session Equipment Utilized During Treatment: Gait belt;Cervical collar Activity Tolerance: Patient tolerated treatment well Patient left: in chair;with call bell/phone within reach Nurse Communication: Mobility status PT Visit Diagnosis: Unsteadiness on feet (R26.81);Pain Pain - part of body:  (shoulders and neck)    Time: 1696-7893 PT Time Calculation (min) (ACUTE ONLY): 19 min   Charges:   PT Evaluation $PT Eval Low Complexity: 1 Low          Conni Slipper, PT, DPT Acute Rehabilitation Services Pager: [phone] Office: [phone]   Marylynn Pearson 04/03/2020, 9:56 AM

## 2020-09-11 ENCOUNTER — Other Ambulatory Visit: Payer: Self-pay | Admitting: Neurosurgery

## 2020-09-11 DIAGNOSIS — M5440 Lumbago with sciatica, unspecified side: Secondary | ICD-10-CM

## 2020-09-29 ENCOUNTER — Ambulatory Visit
Admission: RE | Admit: 2020-09-29 | Discharge: 2020-09-29 | Disposition: A | Discharge status: Home / Self Care | Payer: Medicare Other | Source: Ambulatory Visit | Attending: Neurosurgery | Admitting: Neurosurgery

## 2020-09-29 DIAGNOSIS — M5440 Lumbago with sciatica, unspecified side: Secondary | ICD-10-CM

## 2020-09-29 IMAGING — MR MR LUMBAR SPINE W/O CM
4 of 5 series · 26 of 48 positions shown · non-contrast
Comparison: [DATE].

CLINICAL DATA: Lumbago of lumbar region with sciatica [QV]
([QV]-CM)

EXAM:
MRI LUMBAR SPINE WITHOUT CONTRAST
TECHNIQUE: Multiplanar, multisequence MR imaging of the lumbar spine was
performed. No intravenous contrast was administered.

[Series 3: T2 · sagittal · 4.0mm · 1.09mm/px · 5 of 15 slices shown (1 of 2)]
[im 1/15]
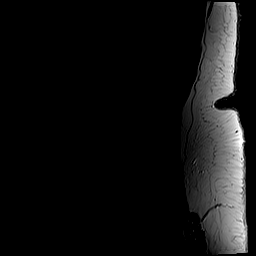
[im 4/15]
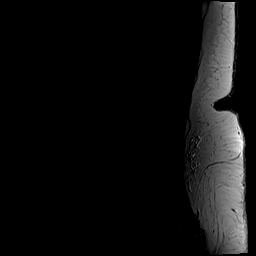
[im 8/15]
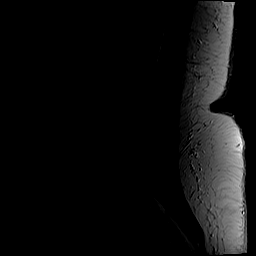
[im 11/15]
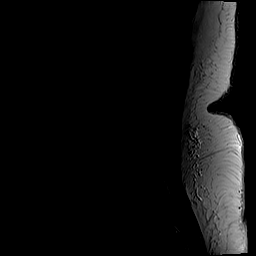
[im 15/15]
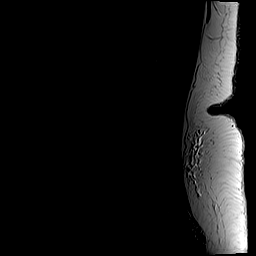

[Series 5: T1 · sagittal · 4.0mm · 1.09mm/px · 6 of 15 slices shown (1 of 2)]
[im 1/15]
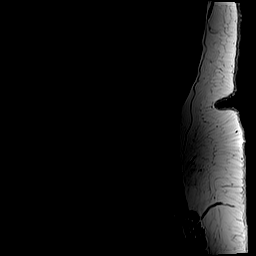
[im 3/15]
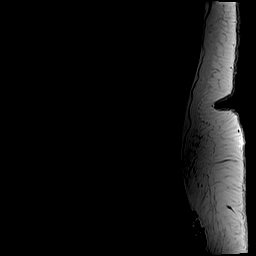
[im 6/15]
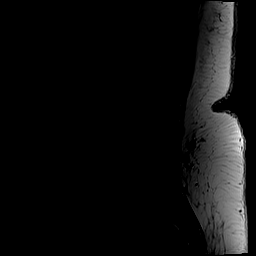
[im 9/15]
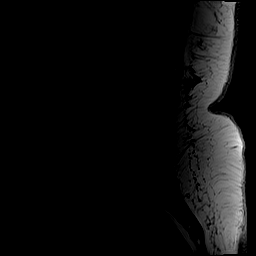
[im 12/15]
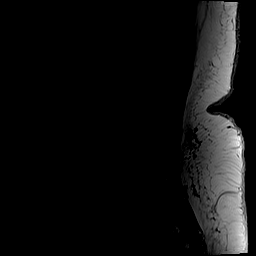
[im 15/15]
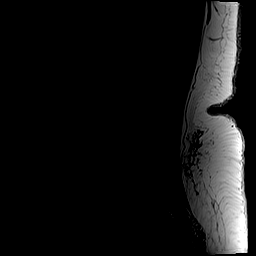

[Series 6: T2 · axial · 4.0mm · 0.39mm/px · z∈[-37,+190]mm · 10 of 42 slices shown (2 of 2)]
[im 3/42]
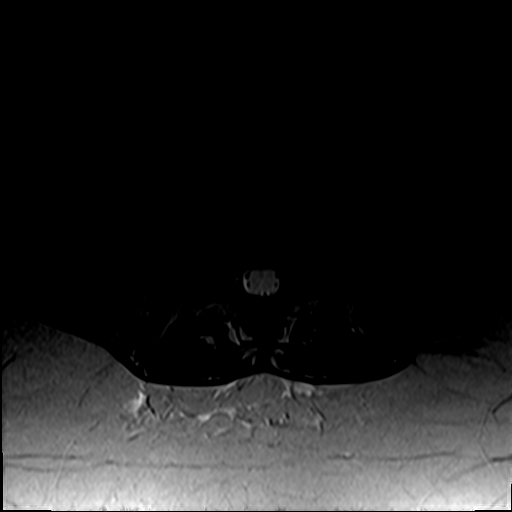
[im 6/42]
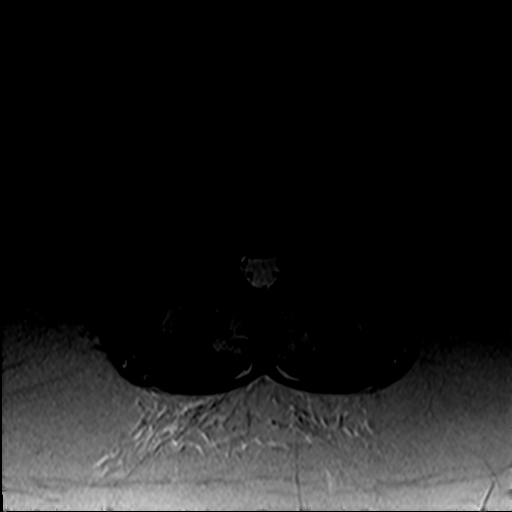
[im 9/42]
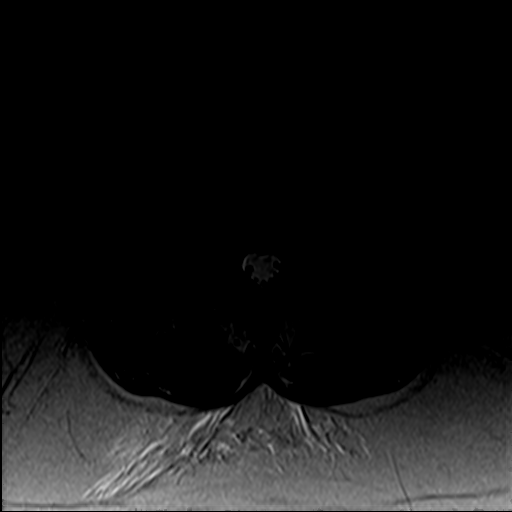
[im 14/42]
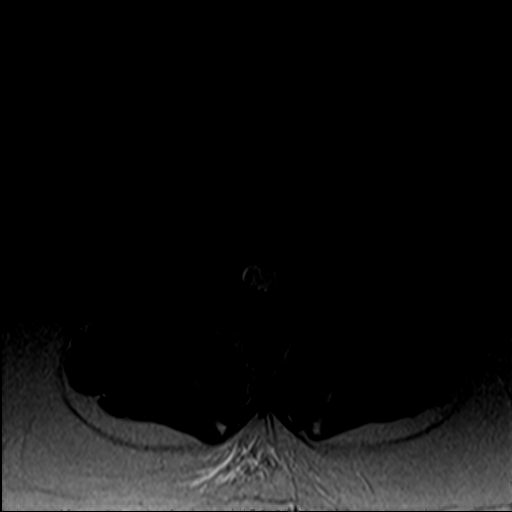
[im 20/42]
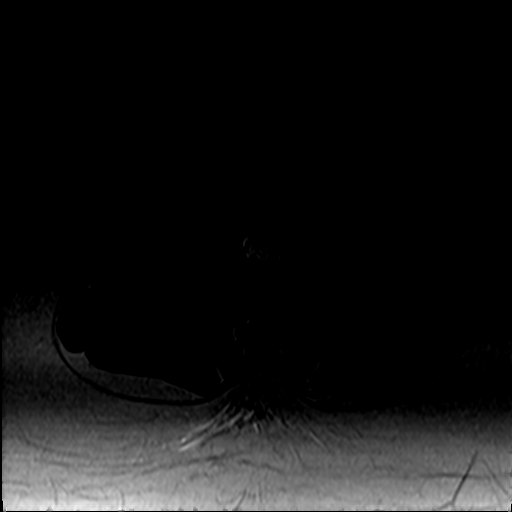
[im 22/42]
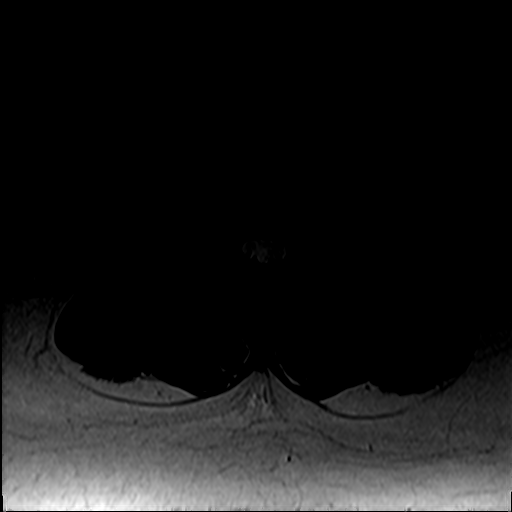
[im 25/42]
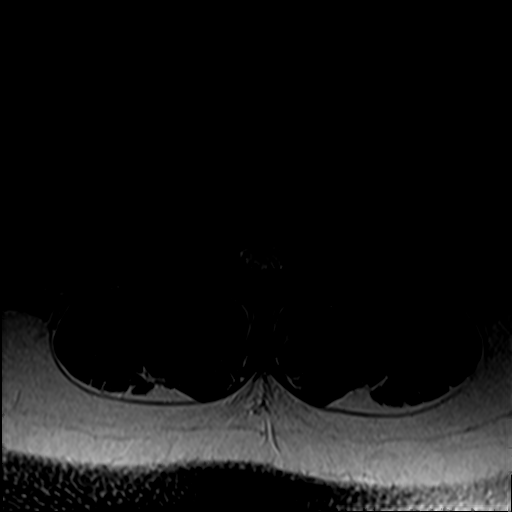
[im 31/42]
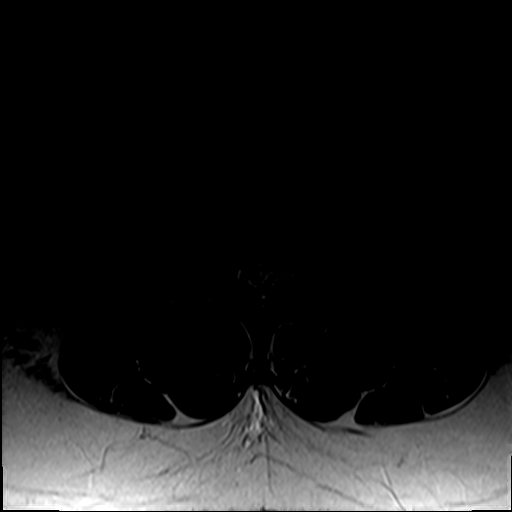
[im 36/42]
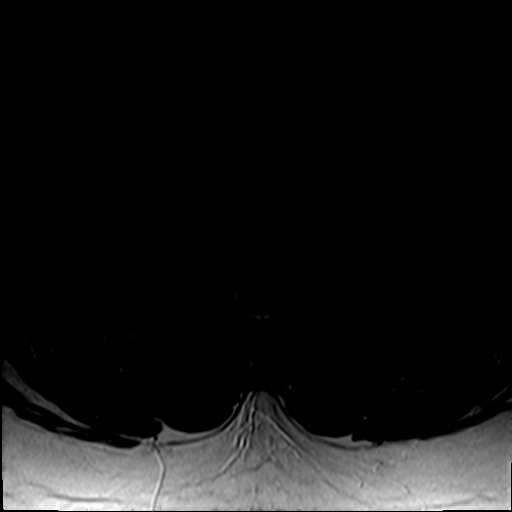
[im 42/42]
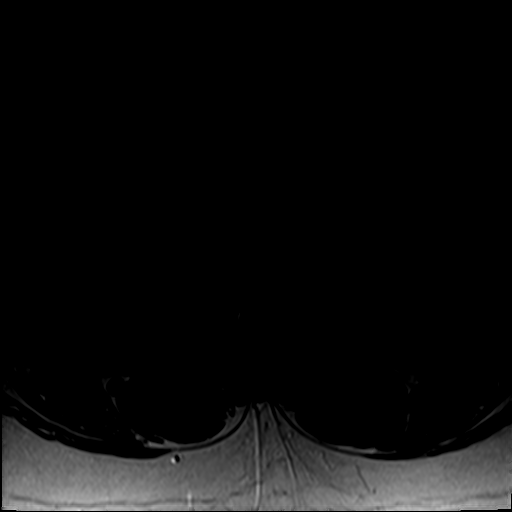

[Series 7: T1 · axial · 4.0mm · 0.39mm/px · z∈[-37,+161]mm · 5 of 42 slices shown (2 of 2)]
[im 3/42]
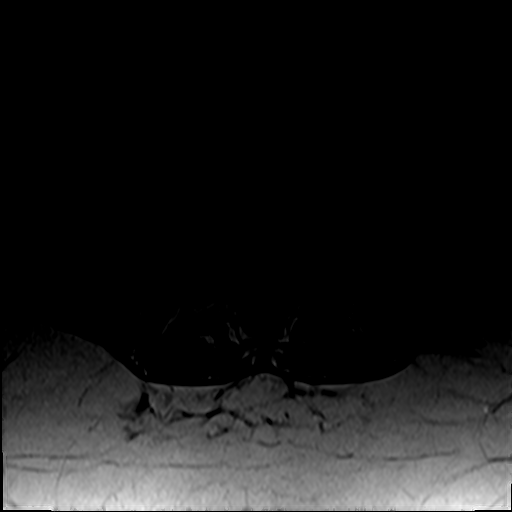
[im 6/42]
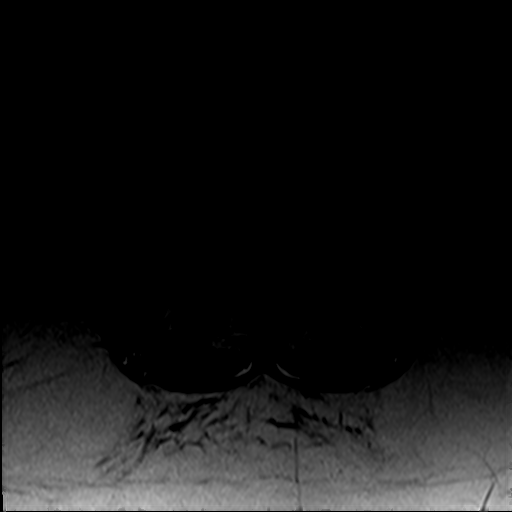
[im 9/42]
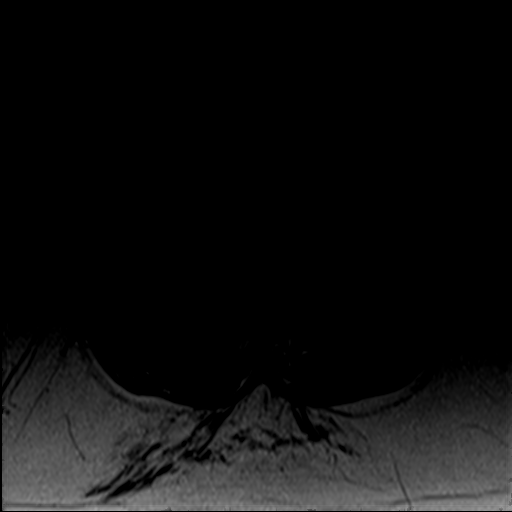
[im 22/42]
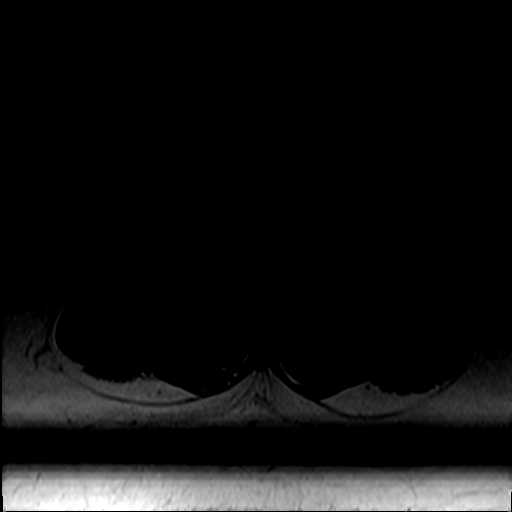
[im 36/42]
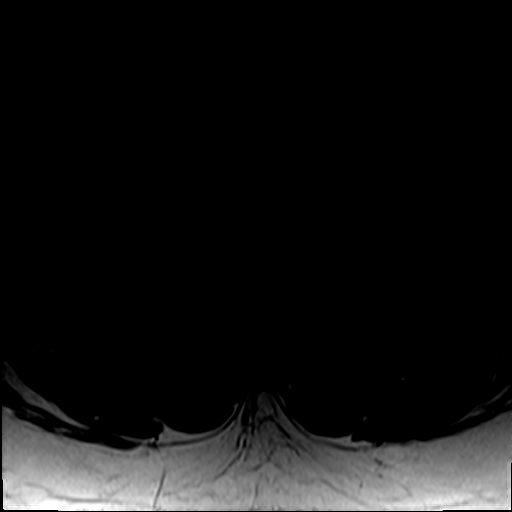

[26 of 48 positions shown; findings below may reference images not displayed]

FINDINGS: Segmentation: For the purposes of this dictation, 5 lumbar vertebral
bodies are assumed with the inferior-most fully formed
intervertebral disc labeled L5-S1

Alignment:  No substantial sagittal subluxation.

Vertebrae: Vertebral body heights are maintained. No marrow edema to
suggest acute fracture discitis/osteomyelitis. No suspicious bone
lesions.

Conus medullaris and cauda equina: Conus extends to the superior L2
level. Conus appears normal.

Paraspinal and other soft tissues: Unremarkable.

Disc levels:

T12-L1: Small disc bulge without significant canal or foraminal
stenosis.

L1-L2: No significant disc protrusion, foraminal stenosis, or canal
stenosis.

L2-L3: Tiny bilateral foraminal disc protrusions without significant
canal or foraminal stenosis. No significant change.

L3-L4: Small foraminal disc protrusions without significant canal or
foraminal stenosis. Mild bilateral facet hypertrophy/arthropathy. No
significant change.

L4-L5: Mild bilateral facet hypertrophy/arthropathy. Tiny central
disc protrusion without significant canal or foraminal stenosis. No
significant change.

L5-S1: Mild bilateral facet hypertrophy. Small central disc
protrusion. Similar mild left foraminal stenosis without significant
canal or right foraminal stenosis.
IMPRESSION: Similar mild multilevel degenerative change (detailed above) without
significant canal stenosis. Similar mild left foraminal stenosis at
L5-S1.

## 2020-12-20 ENCOUNTER — Other Ambulatory Visit: Payer: Self-pay

## 2020-12-20 ENCOUNTER — Ambulatory Visit: Payer: Medicare Other | Attending: Student | Admitting: Physical Therapy

## 2020-12-20 ENCOUNTER — Encounter: Payer: Self-pay | Admitting: Physical Therapy

## 2020-12-20 DIAGNOSIS — M5416 Radiculopathy, lumbar region: Secondary | ICD-10-CM | POA: Insufficient documentation

## 2020-12-20 DIAGNOSIS — M62830 Muscle spasm of back: Secondary | ICD-10-CM | POA: Diagnosis present

## 2020-12-20 DIAGNOSIS — G8929 Other chronic pain: Secondary | ICD-10-CM | POA: Insufficient documentation

## 2020-12-20 DIAGNOSIS — R293 Abnormal posture: Secondary | ICD-10-CM | POA: Diagnosis present

## 2020-12-20 DIAGNOSIS — M5442 Lumbago with sciatica, left side: Secondary | ICD-10-CM | POA: Diagnosis not present

## 2020-12-20 DIAGNOSIS — M6281 Muscle weakness (generalized): Secondary | ICD-10-CM | POA: Insufficient documentation

## 2020-12-20 NOTE — Patient Instructions (Addendum)
    Access Code: [email or web address] URL: [email or web address] Date: 12/20/2020 Prepared by: Glenetta Hew  Exercises Hooklying Hamstring Stretch with Strap - 2-3 x daily - 7 x weekly - 3 reps - 30 sec hold Hooklying Single Knee to Chest Stretch with Towel - 2-3 x daily - 7 x weekly - 3 reps - 30 sec hold Supine Piriformis Stretch with Foot on Ground - 2-3 x daily - 7 x weekly - 3 reps - 30 sec hold Supine Lower Trunk Rotation - 2-3 x daily - 7 x weekly - 5 reps - 10 sec hold Supine Posterior Pelvic Tilt - 2-3 x daily - 7 x weekly - 2 sets - 10 reps - 5 sec hold  Patient Education Trigger Point Dry Needling  Trigger Point Dry Needling  What is Trigger Point Dry Needling (DN)? DN is a physical therapy technique used to treat muscle pain and dysfunction. Specifically, DN helps deactivate muscle trigger points (muscle knots).  A thin filiform needle is used to penetrate the skin and stimulate the underlying trigger point. The goal is for a local twitch response (LTR) to occur and for the trigger point to relax. No medication of any kind is injected during the procedure.   What Does Trigger Point Dry Needling Feel Like?  The procedure feels different for each individual patient. Some patients report that they do not actually feel the needle enter the skin and overall the process is not painful. Very mild bleeding may occur. However, many patients feel a deep cramping in the muscle in which the needle was inserted. This is the local twitch response.   How Will I feel after the treatment? Soreness is normal, and the onset of soreness may not occur for a few hours. Typically this soreness does not last longer than two days.  Bruising is uncommon, however; ice can be used to decrease any possible bruising.  In rare cases feeling tired or nauseous after the treatment is normal. In addition, your symptoms may get worse before they get better, this period will typically not last longer than 24  hours.   What Can I do After My Treatment? Increase your hydration by drinking more water for the next 24 hours. You may place ice or heat on the areas treated that have become sore, however, do not use heat on inflamed or bruised areas. Heat often brings more relief post needling. You can continue your regular activities, but vigorous activity is not recommended initially after the treatment for 24 hours. DN is best combined with other physical therapy such as strengthening, stretching, and other therapies.

## 2020-12-20 NOTE — Therapy (Signed)
[facility] [address]  [address] [place], [place], [address] Phone: [phone]   Fax:  [phone]  Physical Therapy Evaluation  Patient Details  Name: Tracy Daugherty MRN: [id number] Date of Birth: 1960-06-06 Referring Provider (PT): Verlin Dike, NP   Encounter Date: 12/20/2020   PT End of Session - 12/20/20 0934     Visit Number 1    Number of Visits 12    Date for PT Re-Evaluation 01/31/21    Authorization Type [employer] Medicare & Traditional Medicaid    PT Start Time 0934    PT Stop Time 1026    PT Time Calculation (min) 52 min    Activity Tolerance Patient tolerated treatment well    Behavior During Therapy WFL for tasks assessed/performed             Past Medical History:  Diagnosis Date   Arthritis    knees, ankles   Bronchitis    uses inhaler prn   Depression    Diabetes mellitus without complication (HCC)    type 2   GERD (gastroesophageal reflux disease)    Headache    controlled with meds   Hepatitis 2014   Hep C - was treated   HLD (hyperlipidemia)    Hypertension    Hyperthyroidism    RLL pneumonia    x 1   Seasonal allergies    Sleep apnea    uses cpap nightly   Wears glasses    Wears partial dentures    upper    Past Surgical History:  Procedure Laterality Date   ABDOMINAL HYSTERECTOMY     ANTERIOR (CYSTOCELE) AND POSTERIOR REPAIR (RECTOCELE) WITH XENFORM GRAFT AND SACROSPINOUS FIXATION  2013   ANTERIOR CERVICAL DECOMPRESSION/DISCECTOMY FUSION 4 LEVELS N/A 04/02/2020   Procedure: CERVICAL THREE-FOUR, CERVICAL FOUR-FIVE, CERVICAL FIVE-SIX, CERVICAL SIX-SEVEN ANTERIOR CERVICAL DECOMPRESSION/DISCECTOMY FUSION;  Surgeon: Donalee Citrin, MD;  Location: [facility] OR;  Service: Neurosurgery;  Laterality: N/A;   CHOLECYSTECTOMY     COLONOSCOPY     right sided transverse abdominas plane block under ultrasound guidance  2018   TUBAL LIGATION     UPPER GI ENDOSCOPY     WISDOM TOOTH EXTRACTION   1980s    There were no vitals filed for this visit.    Subjective Assessment - 12/20/20 0938     Subjective Pt reports h/o cerivcal ACDF in March but had been having LBP as well (neck was worse). Notes lumbar radiculopathy L>R. LE pain so severe a few weeks ago that she ended up in the ER where she was treated for gout in her L foot. Has had 2 ESI but they have not helped much.    Limitations Sitting;Standing;Walking;House hold activities;Lifting    How long can you sit comfortably? 15-20 minutes    How long can you stand comfortably? 15-20 minutes    How long can you walk comfortably? 5 minutes    Diagnostic tests Lumbar MRI 09/29/20: T12-L1: Small disc bulge without significant canal or foraminal  stenosis.     L1-L2: No significant disc protrusion, foraminal stenosis, or canal  stenosis.     L2-L3: Tiny bilateral foraminal disc protrusions without significant  canal or foraminal stenosis. No significant change.     L3-L4: Small foraminal disc protrusions without significant canal or  foraminal stenosis. Mild bilateral facet hypertrophy/arthropathy. No  significant change.     L4-L5: Mild bilateral facet hypertrophy/arthropathy. Tiny central  disc protrusion without significant canal or foraminal stenosis.  No  significant change.     L5-S1: Mild bilateral facet hypertrophy. Small central disc  protrusion. Similar mild left foraminal stenosis without significant  canal or right foraminal stenosis.    Patient Stated Goals "Be able to relieve this pain so I can walk and do more activities."    Currently in Pain? Yes    Pain Score 5     Pain Location Back    Pain Orientation Lower;Right;Left    Pain Descriptors / Indicators Sharp;Stabbing    Pain Type Chronic pain    Pain Radiating Towards currenltly mild numbness and tingling down back of L LE to foot    Pain Onset Other (comment)   ~1 yr - gradually getting worse   Pain Frequency Constant   varies in intensity   Aggravating Factors  cold temp  & weather changes, bending, driving, getting in/out of (low) car    Pain Relieving Factors hot pack (made from rice); Biofreeze spray    Effect of Pain on Daily Activities pain sometimes bad enough that she needs to use a cane or RW; L LE feels like it wants to buckle at times; difficulty with prologed standing for showering or meal prep                [facility] PT Assessment - 12/20/20 0934       Assessment   Medical Diagnosis Lumbar radiculopathy    Referring Provider (PT) Verlin Dike, NP    Onset Date/Surgical Date --   ~1 yr   Hand Dominance Right    Next MD Visit 01/01/21    Prior Therapy PT for low back ~1 yr ago      Precautions   Precautions None      Restrictions   Weight Bearing Restrictions No      Balance Screen   Has the patient fallen in the past 6 months No    Has the patient had a decrease in activity level because of a fear of falling?  Yes    Is the patient reluctant to leave their home because of a fear of falling?  Yes      Home Environment   Living Environment Private residence    Living Arrangements Alone    Type of Home House    Home Access Stairs to enter    Entrance Stairs-Number of Steps 3    Entrance Stairs-Rails Right;Left;Can reach both    Home Layout One level    Home Equipment [place] - single point;Walker - 4 wheels      Prior Function   Level of Independence Independent    Vocation On disability    Leisure playing with grandbaby, fishing and gardening with nieces & nephews      Cognition   Overall Cognitive Status Within Functional Limits for tasks assessed      Observation/Other Assessments   Focus on Therapeutic Outcomes (FOTO)  Lumbar = 40; predicted D/C FS = 47      Posture/Postural Control   Posture/Postural Control Postural limitations    Postural Limitations Increased lumbar lordosis;Anterior pelvic tilt      ROM / Strength   AROM / PROM / Strength AROM;Strength      AROM   AROM Assessment Site Lumbar    Lumbar  Flexion hands to lower shins - pain through most of ROM    Lumbar Extension 40% limited    Lumbar - Right Side Bend hand to lateral knee    Lumbar - Left Side  Bend hand to lateral knee    Lumbar - Right Rotation 50% limited    Lumbar - Left Rotation 50% limited      Strength   Strength Assessment Site Hip;Knee;Ankle    Right/Left Hip Right;Left    Right Hip Flexion 4/5    Right Hip Extension 4/5    Right Hip External Rotation  4/5    Right Hip Internal Rotation 4+/5    Right Hip ABduction 4/5    Right Hip ADduction 4/5    Left Hip Flexion 4-/5    Left Hip Extension 4-/5    Left Hip External Rotation 4/5    Left Hip Internal Rotation 4+/5    Left Hip ABduction 4-/5    Left Hip ADduction 4-/5    Right/Left Knee Right;Left    Right Knee Flexion 4/5    Right Knee Extension 4+/5    Left Knee Flexion 4/5    Left Knee Extension 4+/5    Right/Left Ankle Right;Left    Right Ankle Dorsiflexion 4+/5    Left Ankle Dorsiflexion 4/5      Flexibility   Soft Tissue Assessment /Muscle Length yes    Hamstrings mod tight B    Quadriceps mild/mod tight L>R    Piriformis mild/mod tight L>R                        Objective measurements completed on examination: See above findings.                PT Education - 12/20/20 1026     Education Details PT eval findings, anticipated POC & initial HEP - Access Code: [email or web address]; Role of DN              PT Short Term Goals - 12/20/20 1026       PT SHORT TERM GOAL #1   Title Patient will be independent with initial HEP    Status New    Target Date 01/10/21      PT SHORT TERM GOAL #2   Title Patient will verbalize/demonstrate understanding of neutral spine posture and proper body mechanics to reduce strain on lumbar spine    Status New    Target Date 01/10/21               PT Long Term Goals - 12/20/20 1026       PT LONG TERM GOAL #1   Title Patient will be independent with ongoing/advanced HEP for  self-management at home    Status New    Target Date 01/31/21      PT LONG TERM GOAL #2   Title Patient to demonstrate ability to achieve and maintain good spinal alignment/posturing and body mechanics needed for daily activities    Status New    Target Date 01/31/21      PT LONG TERM GOAL #3   Title Patient to report reduction in frequency and intensity of LBP and L LE radicular pain/symptoms by >/= 50% to allow for improved activity tolerance    Status New    Target Date 01/31/21      PT LONG TERM GOAL #4   Title Patient to improve lumbar AROM to [facility] without pain provocation    Status New    Target Date 01/31/21      PT LONG TERM GOAL #5   Title Patient will demonstrate improved B proximal LE strength to >/= 4+/5 for improved stability and ease of mobility  Status New    Target Date 01/31/21      PT LONG TERM GOAL #6   Title Patient to report ability to perform ADLs, household, and leisure activities without limitation due to pain, LOM or weakness    Status New    Target Date 01/31/21                    Plan - 12/20/20 1026     Clinical Impression Statement Tracy Daugherty is a 60 y/o female who presents to OP PT for chronic LBP with lumbar radiculopathy going back at least 1 year. She was briefly seen in PT at another location late last year or early this year for her low back w/o much benefit noted but had to stop in order to have 4-level ACDF in March. She reports she has received 2 ESI w/o much benefit noted. Pain limits positional and walking tolerance for ADLs, household activities and shopping, and is sometimes bad enough that she has to use a cane or rollator to get around as she feels that her leg may buckle. Deficits include B low back/sacral and L>R buttock pain with L LE radicular pain, limited and painful lumbar ROM, increased muscle tension and limited proximal LE flexibility L>R especially in HS, hip flexors/quads and piriformis, and mild proximal LE weakness  L>R. Tracy Daugherty will benefit from skilled PT to address pain and above deficits to allow for improved quality of life and return to normal level of activity with reduced pain.    Personal Factors and Comorbidities Age;Comorbidity 3+;Past/Current Experience;Time since onset of injury/illness/exacerbation;Social Background;Fitness    Comorbidities C3-4, C4-5, C5-6, C6-7 ACDF 04/02/20; knee and ankle OA; gout; DM; HTN; HLD; anxiety; depression; headaches; GERD; bronchitis; sleep apnea (CPAP at night)    Examination-Activity Limitations Bathing;Bed Mobility;Bend;Dressing;Lift;Carry;Sit;Stand;Transfers;Locomotion Level;Stairs;Squat    Examination-Participation Restrictions Cleaning;Community Activity;Driving;Laundry;Meal Prep;Shop    Stability/Clinical Decision Making Evolving/Moderate complexity    Clinical Decision Making Moderate    Rehab Potential Good    PT Frequency 2x / week    PT Duration 6 weeks    PT Treatment/Interventions ADLs/Self Care Home Management;Cryotherapy;Electrical Stimulation;Iontophoresis 4mg /ml Dexamethasone;Moist Heat;Traction;Ultrasound;DME Instruction;Gait training;Stair training;Functional mobility training;Therapeutic activities;Therapeutic exercise;Balance training;Neuromuscular re-education;Manual techniques;Passive range of motion;Dry needling;Energy conservation;Taping;Spinal Manipulations    PT Next Visit Plan Review initial HEP; posture & body mechanics education; manual therapy +/- possible trial of DN to lumbar paraspinals & glutes; progress lumbopelvic strengthening and stabilization; modalites PRN    PT Home Exercise Plan Access Code: [email or web address] (12/1)    Consulted and Agree with Plan of Care Patient             Patient will benefit from skilled therapeutic intervention in order to improve the following deficits and impairments:  Abnormal gait, Decreased activity tolerance, Decreased balance, Decreased endurance, Decreased knowledge of precautions, Decreased  knowledge of use of DME, Decreased mobility, Decreased range of motion, Decreased safety awareness, Decreased strength, Difficulty walking, Increased fascial restricitons, Increased muscle spasms, Impaired perceived functional ability, Impaired flexibility, Improper body mechanics, Postural dysfunction, Pain  Visit Diagnosis: Chronic bilateral low back pain with left-sided sciatica  Radiculopathy, lumbar region  Abnormal posture  Muscle weakness (generalized)  Muscle spasm of back     Problem List Patient Active Problem List   Diagnosis Date Noted   Spinal stenosis in cervical region 04/02/2020    04/04/2020, PT 12/20/2020, 12:34 PM  [facility] [address]  [address] [place], [place], [place] Phone: [phone]  Fax:  [phone]  Name: Tracy Daugherty MRN: [id number] Date of Birth: 1960-04-01

## 2020-12-25 ENCOUNTER — Encounter: Payer: Self-pay | Admitting: Physical Therapy

## 2020-12-25 ENCOUNTER — Other Ambulatory Visit: Payer: Self-pay

## 2020-12-25 ENCOUNTER — Ambulatory Visit: Payer: Medicare Other | Admitting: Physical Therapy

## 2020-12-25 DIAGNOSIS — M62830 Muscle spasm of back: Secondary | ICD-10-CM

## 2020-12-25 DIAGNOSIS — M5442 Lumbago with sciatica, left side: Secondary | ICD-10-CM | POA: Diagnosis not present

## 2020-12-25 DIAGNOSIS — G8929 Other chronic pain: Secondary | ICD-10-CM

## 2020-12-25 DIAGNOSIS — R293 Abnormal posture: Secondary | ICD-10-CM

## 2020-12-25 DIAGNOSIS — M5416 Radiculopathy, lumbar region: Secondary | ICD-10-CM

## 2020-12-25 DIAGNOSIS — M6281 Muscle weakness (generalized): Secondary | ICD-10-CM

## 2020-12-25 NOTE — Therapy (Signed)
[facility] [address]  [address] [place], [place], [address] Phone: [phone]   Fax:  [phone]  Physical Therapy Treatment  Patient Details  Name: Tracy Daugherty MRN: [id number] Date of Birth: 20-Jan-1961 Referring Provider (PT): Verlin Dike, NP   Encounter Date: 12/25/2020   PT End of Session - 12/25/20 0934     Visit Number 2    Number of Visits 12    Date for PT Re-Evaluation 01/31/21    Authorization Type UHC Medicare & Traditional Medicaid    PT Start Time [phone]    PT Stop Time 1014    PT Time Calculation (min) 40 min    Activity Tolerance Patient tolerated treatment well    Behavior During Therapy WFL for tasks assessed/performed             Past Medical History:  Diagnosis Date   Arthritis    knees, ankles   Bronchitis    uses inhaler prn   Depression    Diabetes mellitus without complication (HCC)    type 2   GERD (gastroesophageal reflux disease)    Headache    controlled with meds   Hepatitis 2014   Hep C - was treated   HLD (hyperlipidemia)    Hypertension    Hyperthyroidism    RLL pneumonia    x 1   Seasonal allergies    Sleep apnea    uses cpap nightly   Wears glasses    Wears partial dentures    upper    Past Surgical History:  Procedure Laterality Date   ABDOMINAL HYSTERECTOMY     ANTERIOR (CYSTOCELE) AND POSTERIOR REPAIR (RECTOCELE) WITH XENFORM GRAFT AND SACROSPINOUS FIXATION  2013   ANTERIOR CERVICAL DECOMPRESSION/DISCECTOMY FUSION 4 LEVELS N/A 04/02/2020   Procedure: CERVICAL THREE-FOUR, CERVICAL FOUR-FIVE, CERVICAL FIVE-SIX, CERVICAL SIX-SEVEN ANTERIOR CERVICAL DECOMPRESSION/DISCECTOMY FUSION;  Surgeon: Donalee Citrin, MD;  Location: [facility] OR;  Service: Neurosurgery;  Laterality: N/A;   CHOLECYSTECTOMY     COLONOSCOPY     right sided transverse abdominas plane block under ultrasound guidance  2018   TUBAL LIGATION     UPPER GI ENDOSCOPY     WISDOM TOOTH EXTRACTION  1980s     There were no vitals filed for this visit.   Subjective Assessment - 12/25/20 0934     Subjective Pt reporting pain in left SIJ area. Worse with bending forward.    Diagnostic tests Lumbar MRI 09/29/20: T12-L1: Small disc bulge without significant canal or foraminal  stenosis.     L1-L2: No significant disc protrusion, foraminal stenosis, or canal  stenosis.     L2-L3: Tiny bilateral foraminal disc protrusions without significant  canal or foraminal stenosis. No significant change.     L3-L4: Small foraminal disc protrusions without significant canal or  foraminal stenosis. Mild bilateral facet hypertrophy/arthropathy. No  significant change.     L4-L5: Mild bilateral facet hypertrophy/arthropathy. Tiny central  disc protrusion without significant canal or foraminal stenosis. No  significant change.     L5-S1: Mild bilateral facet hypertrophy. Small central disc  protrusion. Similar mild left foraminal stenosis without significant  canal or right foraminal stenosis.    Patient Stated Goals "Be able to relieve this pain so I can walk and do more activities."    Currently in Pain? Yes    Pain Score 5     Pain Location Back    Pain Orientation Lower;Left    Pain Descriptors / Indicators Sharp;Stabbing  Pain Type Chronic pain                               [facility] Adult PT Treatment/Exercise - 12/25/20 0001       Self-Care   Self-Care ADL's;Lifting;Posture    ADL's Reviewed body mechanics handout including car transfers and sit to stand transfers    Lifting squat lift floor to stand    Posture sitting      Exercises   Exercises Lumbar      Lumbar Exercises: Aerobic   Nustep L5 x 5 min      Manual Therapy   Manual Therapy Soft tissue mobilization    Manual therapy comments Skilled palpation and monitoring of soft tissues during DN    Soft tissue mobilization to left gluteals and piriformis              Trigger Point Dry Needling - 12/25/20 0001      Consent Given? Yes    Education Handout Provided Previously provided    Muscles Treated Back/Hip Gluteus minimus;Gluteus medius;Gluteus maximus;Piriformis    Dry Needling Comments left    Gluteus Minimus Response Twitch response elicited;Palpable increased muscle length    Gluteus Medius Response Twitch response elicited;Palpable increased muscle length    Gluteus Maximus Response Twitch response elicited;Palpable increased muscle length    Piriformis Response Twitch response elicited;Palpable increased muscle length                   PT Education - 12/25/20 1017     Education Details posture and body mechanics; DN education and aftercare    Person(s) Educated Patient    Methods Explanation;Demonstration;Verbal cues;Handout    Comprehension Verbalized understanding;Returned demonstration              PT Short Term Goals - 12/20/20 1026       PT SHORT TERM GOAL #1   Title Patient will be independent with initial HEP    Status New    Target Date 01/10/21      PT SHORT TERM GOAL #2   Title Patient will verbalize/demonstrate understanding of neutral spine posture and proper body mechanics to reduce strain on lumbar spine    Status New    Target Date 01/10/21               PT Long Term Goals - 12/20/20 1026       PT LONG TERM GOAL #1   Title Patient will be independent with ongoing/advanced HEP for self-management at home    Status New    Target Date 01/31/21      PT LONG TERM GOAL #2   Title Patient to demonstrate ability to achieve and maintain good spinal alignment/posturing and body mechanics needed for daily activities    Status New    Target Date 01/31/21      PT LONG TERM GOAL #3   Title Patient to report reduction in frequency and intensity of LBP and L LE radicular pain/symptoms by >/= 50% to allow for improved activity tolerance    Status New    Target Date 01/31/21      PT LONG TERM GOAL #4   Title Patient to improve lumbar AROM to [facility]  without pain provocation    Status New    Target Date 01/31/21      PT LONG TERM GOAL #5   Title Patient will demonstrate improved B proximal LE strength  to >/= 4+/5 for improved stability and ease of mobility    Status New    Target Date 01/31/21      PT LONG TERM GOAL #6   Title Patient to report ability to perform ADLs, household, and leisure activities without limitation due to pain, LOM or weakness    Status New    Target Date 01/31/21                   Plan - 12/25/20 1019     Clinical Impression Statement Patient presenting with left sided low back pain today. We reviewed posture and body mechanics as well as lifting with good return by pt. Initial trial of DN to left gluteals with very good response. If positive response, pt may benefit from continued DN to lumbar paraspinals.    Comorbidities C3-4, C4-5, C5-6, C6-7 ACDF 04/02/20; knee and ankle OA; gout; DM; HTN; HLD; anxiety; depression; headaches; GERD; bronchitis; sleep apnea (CPAP at night)    PT Frequency 2x / week    PT Duration 6 weeks    PT Treatment/Interventions ADLs/Self Care Home Management;Cryotherapy;Electrical Stimulation;Iontophoresis 4mg /ml Dexamethasone;Moist Heat;Traction;Ultrasound;DME Instruction;Gait training;Stair training;Functional mobility training;Therapeutic activities;Therapeutic exercise;Balance training;Neuromuscular re-education;Manual techniques;Passive range of motion;Dry needling;Energy conservation;Taping;Spinal Manipulations    PT Next Visit Plan Assess DN response; progress lumbopelvic strengthening and stabilization; modalites PRN    PT Home Exercise Plan Access Code: [email or web address] ([email or web address])    Consulted and Agree with Plan of Care Patient             Patient will benefit from skilled therapeutic intervention in order to improve the following deficits and impairments:  Abnormal gait, Decreased activity tolerance, Decreased balance, Decreased endurance, Decreased knowledge of  precautions, Decreased knowledge of use of DME, Decreased mobility, Decreased range of motion, Decreased safety awareness, Decreased strength, Difficulty walking, Increased fascial restricitons, Increased muscle spasms, Impaired perceived functional ability, Impaired flexibility, Improper body mechanics, Postural dysfunction, Pain  Visit Diagnosis: Chronic bilateral low back pain with left-sided sciatica  Radiculopathy, lumbar region  Abnormal posture  Muscle weakness (generalized)  Muscle spasm of back     Problem List Patient Active Problem List   Diagnosis Date Noted   Spinal stenosis in cervical region 04/02/2020   04/04/2020, PT 12/25/2020, 11:30 AM  [facility] [address]  [address] [place], [place], [place] Phone: [phone]   Fax:  [phone]  Name: Tracy Daugherty MRN: Tracy Daugherty Date of Birth: December 13, 1960

## 2020-12-25 NOTE — Patient Instructions (Signed)
Access Code: [email or web address] URL: [email or web address] Date: 12/25/2020 Prepared by: Raynelle Fanning  Exercises Hooklying Hamstring Stretch with Strap - 2-3 x daily - 7 x weekly - 3 reps - 30 sec hold Hooklying Single Knee to Chest Stretch with Towel - 2-3 x daily - 7 x weekly - 3 reps - 30 sec hold Supine Piriformis Stretch with Foot on Ground - 2-3 x daily - 7 x weekly - 3 reps - 30 sec hold Supine Lower Trunk Rotation - 2-3 x daily - 7 x weekly - 5 reps - 10 sec hold Supine Posterior Pelvic Tilt - 2-3 x daily - 7 x weekly - 2 sets - 10 reps - 5 sec hold  Patient Education Insurance underwriter

## 2020-12-28 ENCOUNTER — Encounter: Payer: Self-pay | Admitting: Physical Therapy

## 2020-12-28 ENCOUNTER — Other Ambulatory Visit: Payer: Self-pay

## 2020-12-28 ENCOUNTER — Ambulatory Visit: Payer: Medicare Other | Admitting: Physical Therapy

## 2020-12-28 DIAGNOSIS — M6281 Muscle weakness (generalized): Secondary | ICD-10-CM

## 2020-12-28 DIAGNOSIS — M5442 Lumbago with sciatica, left side: Secondary | ICD-10-CM | POA: Diagnosis not present

## 2020-12-28 DIAGNOSIS — M62830 Muscle spasm of back: Secondary | ICD-10-CM

## 2020-12-28 DIAGNOSIS — G8929 Other chronic pain: Secondary | ICD-10-CM

## 2020-12-28 DIAGNOSIS — M5416 Radiculopathy, lumbar region: Secondary | ICD-10-CM

## 2020-12-28 DIAGNOSIS — R293 Abnormal posture: Secondary | ICD-10-CM

## 2020-12-28 NOTE — Therapy (Addendum)
[facility] [address]  [place], [place], [address] Phone: [phone]   Fax:  [phone]  Physical Therapy Treatment / Progress Note / Discharge Summary  Patient Details  Name: Tracy Daugherty MRN: [id number] Date of Birth: 1960/01/28 Referring Provider (PT): Glenford Peers, NP  Progress Note  Reporting Period 12/20/2020 to 12/28/2020  See note below for Objective Data and Assessment of Progress/Goals.     Encounter Date: 12/28/2020   PT End of Session - 12/28/20 0930     Visit Number 3    Number of Visits 12    Date for PT Re-Evaluation 01/31/21    Authorization Type UHC Medicare & Traditional Medicaid    PT Start Time 0930    PT Stop Time 1034    PT Time Calculation (min) 64 min    Activity Tolerance Patient tolerated treatment well    Behavior During Therapy WFL for tasks assessed/performed             Past Medical History:  Diagnosis Date   Arthritis    knees, ankles   Bronchitis    uses inhaler prn   Depression    Diabetes mellitus without complication ([place])    type 2   GERD (gastroesophageal reflux disease)    Headache    controlled with meds   Hepatitis 2014   Hep C - was treated   HLD (hyperlipidemia)    Hypertension    Hyperthyroidism    RLL pneumonia    x 1   Seasonal allergies    Sleep apnea    uses cpap nightly   Wears glasses    Wears partial dentures    upper    Past Surgical History:  Procedure Laterality Date   ABDOMINAL HYSTERECTOMY     ANTERIOR (CYSTOCELE) AND POSTERIOR REPAIR (RECTOCELE) WITH XENFORM GRAFT AND SACROSPINOUS FIXATION  2013   ANTERIOR CERVICAL DECOMPRESSION/DISCECTOMY FUSION 4 LEVELS N/A 04/02/2020   Procedure: CERVICAL THREE-FOUR, CERVICAL FOUR-FIVE, CERVICAL FIVE-SIX, CERVICAL SIX-SEVEN ANTERIOR CERVICAL DECOMPRESSION/DISCECTOMY FUSION;  Surgeon: Kary Kos, MD;  Location: [facility];  Service: Neurosurgery;  Laterality: N/A;   CHOLECYSTECTOMY      COLONOSCOPY     right sided transverse abdominas plane block under ultrasound guidance  2018   TUBAL LIGATION     UPPER GI ENDOSCOPY     WISDOM TOOTH EXTRACTION  1980s    There were no vitals filed for this visit.   Subjective Assessment - 12/28/20 0934     Subjective Pt reports the DN seemed to help with the tightness in her buttock but still feels sore across her lower lumbar spine. Pain seems to be more elevated due to the inclement weather.    Diagnostic tests Lumbar MRI 09/29/20: T12-L1: Small disc bulge without significant canal or foraminal  stenosis.     L1-L2: No significant disc protrusion, foraminal stenosis, or canal  stenosis.     L2-L3: Tiny bilateral foraminal disc protrusions without significant  canal or foraminal stenosis. No significant change.     L3-L4: Small foraminal disc protrusions without significant canal or  foraminal stenosis. Mild bilateral facet hypertrophy/arthropathy. No  significant change.     L4-L5: Mild bilateral facet hypertrophy/arthropathy. Tiny central  disc protrusion without significant canal or foraminal stenosis. No  significant change.     L5-S1: Mild bilateral facet hypertrophy. Small central disc  protrusion. Similar mild left foraminal stenosis without significant  canal or right foraminal stenosis.    Patient Stated  Goals "Be able to relieve this pain so I can walk and do more activities."    Currently in Pain? Yes    Pain Score 7     Pain Location Back    Pain Orientation Lower    Pain Descriptors / Indicators Aching;Stabbing    Pain Type Chronic pain                               [facility] Adult PT Treatment/Exercise - 12/28/20 0930       Exercises   Exercises Lumbar      Lumbar Exercises: Aerobic   Recumbent Bike L2 x 6 min      Lumbar Exercises: Supine   Pelvic Tilt 10 reps;5 seconds    Clam 10 reps;3 seconds    Clam Limitations TrA + bent-knee fall out    Bent Knee Raise 10 reps;3 seconds    Bent Knee Raise  Limitations brace marching    Bridge with Ball Squeeze 10 reps;5 seconds    Other Supine Lumbar Exercises TrA/PPT + hip ADD isometric ball squeeze 10 x 5"      Modalities   Modalities Electrical Stimulation;Moist Heat      Moist Heat Therapy   Number Minutes Moist Heat 15 Minutes    Moist Heat Location Lumbar Spine      Electrical Stimulation   Electrical Stimulation Location B lumbar paraspinals and upper glutes    Electrical Stimulation Action IFC    Electrical Stimulation Parameters 80-150 Hz, intensity to pt tolerance x 15'    Electrical Stimulation Goals Pain;Tone      Manual Therapy   Manual Therapy Soft tissue mobilization;Myofascial release    Manual therapy comments Skilled palpation and monitoring of soft tissues during DN    Soft tissue mobilization STM/DTM to B lumbar paraspinals    Myofascial Release pin & stretch to B lumbar paraspinals              Trigger Point Dry Needling - 12/28/20 0930     Consent Given? Yes    Muscles Treated Back/Hip Lumbar multifidi;Erector spinae    Dry Needling Comments bilateral    Erector spinae Response Twitch response elicited;Palpable increased muscle length    Lumbar multifidi Response Twitch response elicited;Palpable increased muscle length                   PT Education - 12/28/20 1028     Education Details Role of estim/TENS and information on home TENS unit    Person(s) Educated Patient    Methods Explanation;Demonstration;Handout    Comprehension Verbalized understanding;Need further instruction              PT Short Term Goals - 12/28/20 0937       PT SHORT TERM GOAL #1   Title Patient will be independent with initial HEP    Status On-going    Target Date 01/10/21      PT SHORT TERM GOAL #2   Title Patient will verbalize/demonstrate understanding of neutral spine posture and proper body mechanics to reduce strain on lumbar spine    Status Achieved   12/28/20   Target Date --                PT Long Term Goals - 12/28/20 0937       PT LONG TERM GOAL #1   Title Patient will be independent with ongoing/advanced HEP for self-management at home  Status On-going    Target Date 01/31/21      PT LONG TERM GOAL #2   Title Patient to demonstrate ability to achieve and maintain good spinal alignment/posturing and body mechanics needed for daily activities    Status On-going    Target Date 01/31/21      PT LONG TERM GOAL #3   Title Patient to report reduction in frequency and intensity of LBP and L LE radicular pain/symptoms by >/= 50% to allow for improved activity tolerance    Status On-going    Target Date 01/31/21      PT LONG TERM GOAL #4   Title Patient to improve lumbar AROM to [facility] without pain provocation    Status On-going    Target Date 01/31/21      PT LONG TERM GOAL #5   Title Patient will demonstrate improved B proximal LE strength to >/= 4+/5 for improved stability and ease of mobility    Status On-going    Target Date 01/31/21      PT LONG TERM GOAL #6   Title Patient to report ability to perform ADLs, household, and leisure activities without limitation due to pain, LOM or weakness    Status On-going    Target Date 01/31/21                   Plan - 12/28/20 1610     Clinical Impression Statement Tracy Daugherty reports good understanding of posture and body mechanics education provided last visit, noting much of it was common sense once you are aware of it, and denying need for further review - STG #2 met. She reports benefit from DN with decreased tightness and soreness in buttock muscles but did note that the DN made her somewhat nauseated last visit. She reports more midline LBP today with intensity slightly increased due to the inclement weather. Proceeded with further MT and DN to lumbar paraspinals today with pt denying any nausea or other adverse reactions. MT/DN followed by progression of lumbopelvic strengthening and stabilization exercises  with good tolerance reported. Session concluded with trial of estim and moist heat to promote further muscle relaxation with pt noting benefit, therefore information provided on home TENS unit.    Comorbidities C3-4, C4-5, C5-6, C6-7 ACDF 04/02/20; knee and ankle OA; gout; DM; HTN; HLD; anxiety; depression; headaches; GERD; bronchitis; sleep apnea (CPAP at night)    Rehab Potential Good    PT Frequency 2x / week    PT Duration 6 weeks    PT Treatment/Interventions ADLs/Self Care Home Management;Cryotherapy;Electrical Stimulation;Iontophoresis 70m/ml Dexamethasone;Moist Heat;Traction;Ultrasound;DME Instruction;Gait training;Stair training;Functional mobility training;Therapeutic activities;Therapeutic exercise;Balance training;Neuromuscular re-education;Manual techniques;Passive range of motion;Dry needling;Energy conservation;Taping;Spinal Manipulations    PT Next Visit Plan Assess DN response; progress lumbopelvic strengthening and stabilization; modalities PRN    PT Home Exercise Plan Access Code: [email or web address])    Consulted and Agree with Plan of Care Patient             Patient will benefit from skilled therapeutic intervention in order to improve the following deficits and impairments:  Abnormal gait, Decreased activity tolerance, Decreased balance, Decreased endurance, Decreased knowledge of precautions, Decreased knowledge of use of DME, Decreased mobility, Decreased range of motion, Decreased safety awareness, Decreased strength, Difficulty walking, Increased fascial restricitons, Increased muscle spasms, Impaired perceived functional ability, Impaired flexibility, Improper body mechanics, Postural dysfunction, Pain  Visit Diagnosis: Chronic bilateral low back pain with left-sided sciatica  Radiculopathy, lumbar region  Abnormal posture  Muscle weakness (generalized)  Muscle spasm of back     Problem List Patient Active Problem List   Diagnosis Date Noted   Spinal stenosis  in cervical region 04/02/2020    Percival Spanish, PT 12/28/2020, 12:00 PM  [facility] [address]  [place], [place], [address] Phone: [phone]   Fax:  [phone]  Name: Tracy Daugherty MRN: [id number] Date of Birth: September 10, 1960   PHYSICAL THERAPY DISCHARGE SUMMARY  Visits from Start of Care: 3  Current functional level related to goals / functional outcomes:   Refer to above clinical impression for status as of last visit on 12/28/2020. Patient cancelled x 4 and no showed x 3 for remaining scheduled visits, therefore will proceed with discharge from PT for this episode per policy due to excessive cancellation and no shows.   Remaining deficits:   As above. Unable to further assess progress due to failure to return to PT.   Education / Equipment:   HEP   Patient agrees to discharge. Patient goals were partially met. Patient is being discharged due to not returning since the last visit.  Percival Spanish, PT, MPT 02/18/21, 3:31 PM  [facility] [address]  [place], [place], [address] Phone: [phone]   Fax:  [phone]

## 2020-12-28 NOTE — Patient Instructions (Signed)
Access Code: [email or web address] URL: [email or web address] Date: 12/28/2020 Prepared by: Glenetta Hew  Exercises Hooklying Hamstring Stretch with Strap - 2-3 x daily - 7 x weekly - 3 reps - 30 sec hold Hooklying Single Knee to Chest Stretch with Towel - 2-3 x daily - 7 x weekly - 3 reps - 30 sec hold Supine Piriformis Stretch with Foot on Ground - 2-3 x daily - 7 x weekly - 3 reps - 30 sec hold Supine Lower Trunk Rotation - 2-3 x daily - 7 x weekly - 5 reps - 10 sec hold Supine Posterior Pelvic Tilt - 2-3 x daily - 7 x weekly - 2 sets - 10 reps - 5 sec hold  Patient Education Trigger Point Dry Needling Posture and Body Mechanics TENS Unit TENS Therapy

## 2021-01-01 ENCOUNTER — Encounter: Payer: Medicare Other | Admitting: Physical Therapy

## 2021-01-04 ENCOUNTER — Ambulatory Visit: Payer: Medicare Other | Admitting: Physical Therapy

## 2021-01-08 ENCOUNTER — Encounter: Payer: Medicare Other | Admitting: Physical Therapy

## 2021-01-09 ENCOUNTER — Ambulatory Visit: Payer: Medicare Other | Admitting: Physical Therapy

## 2021-01-11 ENCOUNTER — Ambulatory Visit: Payer: Medicare Other

## 2021-01-16 ENCOUNTER — Ambulatory Visit: Payer: Medicare Other | Admitting: Physical Therapy

## 2021-01-22 ENCOUNTER — Ambulatory Visit: Payer: Medicare Other | Attending: Student | Admitting: Physical Therapy

## 2021-08-16 ENCOUNTER — Other Ambulatory Visit: Payer: Self-pay | Admitting: Neurosurgery

## 2021-08-16 DIAGNOSIS — M542 Cervicalgia: Secondary | ICD-10-CM

## 2021-08-27 ENCOUNTER — Ambulatory Visit
Admission: RE | Admit: 2021-08-27 | Discharge: 2021-08-27 | Disposition: A | Discharge status: Home / Self Care | Payer: Medicare Other | Source: Ambulatory Visit | Attending: Neurosurgery | Admitting: Neurosurgery

## 2021-08-27 DIAGNOSIS — M542 Cervicalgia: Secondary | ICD-10-CM

## 2021-10-08 ENCOUNTER — Other Ambulatory Visit (HOSPITAL_BASED_OUTPATIENT_CLINIC_OR_DEPARTMENT_OTHER): Payer: Self-pay | Admitting: Student

## 2021-10-08 DIAGNOSIS — M25511 Pain in right shoulder: Secondary | ICD-10-CM

## 2021-10-12 ENCOUNTER — Ambulatory Visit (HOSPITAL_BASED_OUTPATIENT_CLINIC_OR_DEPARTMENT_OTHER)
Admission: RE | Admit: 2021-10-12 | Discharge: 2021-10-12 | Disposition: A | Discharge status: Home / Self Care | Payer: Medicare Other | Source: Ambulatory Visit | Attending: Student | Admitting: Student

## 2021-10-12 DIAGNOSIS — M25511 Pain in right shoulder: Secondary | ICD-10-CM | POA: Insufficient documentation

## 2021-12-15 HISTORY — PX: SHOULDER ARTHROSCOPY: SHX128

## 2021-12-23 ENCOUNTER — Ambulatory Visit: Payer: Medicare Other | Attending: Orthopedic Surgery

## 2021-12-23 ENCOUNTER — Other Ambulatory Visit: Payer: Self-pay

## 2021-12-23 DIAGNOSIS — M25511 Pain in right shoulder: Secondary | ICD-10-CM | POA: Insufficient documentation

## 2021-12-23 DIAGNOSIS — M6281 Muscle weakness (generalized): Secondary | ICD-10-CM | POA: Insufficient documentation

## 2021-12-23 DIAGNOSIS — R600 Localized edema: Secondary | ICD-10-CM | POA: Diagnosis present

## 2021-12-23 NOTE — Therapy (Signed)
OUTPATIENT PHYSICAL THERAPY SHOULDER EVALUATION   Patient Name: Tracy Daugherty MRN: [id number] DOB:1960-05-17, 61 y.o., female Today's Date: 12/23/2021  END OF SESSION:  PT End of Session - 12/23/21 1335     Visit Number 1    Number of Visits 24    Date for PT Re-Evaluation 03/17/22    Authorization Type UHC Medicare/Medicaid    PT Start Time 1221    PT Stop Time 1254    PT Time Calculation (min) 33 min    Activity Tolerance Patient tolerated treatment well    Behavior During Therapy WFL for tasks assessed/performed             Past Medical History:  Diagnosis Date   Arthritis    knees, ankles   Bronchitis    uses inhaler prn   Depression    Diabetes mellitus without complication (HCC)    type 2   GERD (gastroesophageal reflux disease)    Headache    controlled with meds   Hepatitis 2014   Hep C - was treated   HLD (hyperlipidemia)    Hypertension    Hyperthyroidism    RLL pneumonia    x 1   Seasonal allergies    Sleep apnea    uses cpap nightly   Wears glasses    Wears partial dentures    upper   Past Surgical History:  Procedure Laterality Date   ABDOMINAL HYSTERECTOMY     ANTERIOR (CYSTOCELE) AND POSTERIOR REPAIR (RECTOCELE) WITH XENFORM GRAFT AND SACROSPINOUS FIXATION  2013   ANTERIOR CERVICAL DECOMPRESSION/DISCECTOMY FUSION 4 LEVELS N/A 04/02/2020   Procedure: CERVICAL THREE-FOUR, CERVICAL FOUR-FIVE, CERVICAL FIVE-SIX, CERVICAL SIX-SEVEN ANTERIOR CERVICAL DECOMPRESSION/DISCECTOMY FUSION;  Surgeon: Donalee Citrin, MD;  Location: [facility] OR;  Service: Neurosurgery;  Laterality: N/A;   CHOLECYSTECTOMY     COLONOSCOPY     right sided transverse abdominas plane block under ultrasound guidance  2018   TUBAL LIGATION     UPPER GI ENDOSCOPY     WISDOM TOOTH EXTRACTION  1980s   Patient Active Problem List   Diagnosis Date Noted   Spinal stenosis in cervical region 04/02/2020    PCP: Kennith Maes, PA-C  REFERRING PROVIDER: Frederico Hamman, MD   REFERRING  DIAG: P/O RT SHLD SCOPE DISTAL CLAVICLECTOMY POSS OPEN ROTATOR CUFF REPAIR   THERAPY DIAG:  Acute pain of right shoulder - Plan: PT plan of care cert/re-cert  Muscle weakness (generalized) - Plan: PT plan of care cert/re-cert  Localized edema - Plan: PT plan of care cert/re-cert  Rationale for Evaluation and Treatment: Rehabilitation  ONSET DATE: 12/18/2021  SUBJECTIVE:  SUBJECTIVE STATEMENT: Pt presents to PT s/p R shoulder scope with distal clavicectomy and RTC repair. Has been compliant with sling usage since and notes varying degrees of pain. Pt is R hand dominant and was very limited in performance of ADLs pre surgery secondary to pain. She notes occasional paraesthesias down R UE since surgery.   PERTINENT HISTORY: HTN, DM II, Depression  PAIN:  Are you having pain?  Yes: NPRS scale: 7/10 Worst: 10/10 Pain location: R anterior shoulder  Pain description: sharp, post surgical Aggravating factors: lying down Relieving factors: positioning   PRECAUTIONS: Shoulder  WEIGHT BEARING RESTRICTIONS: Yes NWB R shoulder  FALLS:  Has patient fallen in last 6 months? No  LIVING ENVIRONMENT: Lives with: lives with their family Lives in: House/apartment  OCCUPATION: Not working  PLOF: Independent with basic ADLs  PATIENT GOALS: Pt would like to get back   NEXT MD VISIT: 12/24/2021  OBJECTIVE:   DIAGNOSTIC FINDINGS:  See imaging   PATIENT SURVEYS:  FOTO: 4% function; 50% predicted   COGNITION: Overall cognitive status: Within functional limits for tasks assessed     SENSATION: WFL  POSTURE: Rounded shoulders, fwd head, sling donned on R UE  UPPER EXTREMITY ROM:   Passive ROM Right eval Left eval  Shoulder flexion 45   Shoulder extension    Shoulder abduction 30   Shoulder  adduction    Shoulder internal rotation 40   Shoulder external rotation 10   Elbow flexion    Elbow extension    Wrist flexion    Wrist extension    Wrist ulnar deviation    Wrist radial deviation    Wrist pronation    Wrist supination    (Blank rows = not tested)  UPPER EXTREMITY MMT:  MMT Right eval Left eval  Shoulder flexion    Shoulder extension    Shoulder abduction    Shoulder adduction    Shoulder internal rotation    Shoulder external rotation    Middle trapezius    Lower trapezius    Elbow flexion    Elbow extension    Wrist flexion    Wrist extension    Wrist ulnar deviation    Wrist radial deviation    Wrist pronation    Wrist supination    Grip strength (lbs)    (Blank rows = not tested)  SHOULDER SPECIAL TESTS: Impingement tests: DNT SLAP lesions: DNT Instability tests: DNT Rotator cuff assessment: DNT Biceps assessment: DNT  JOINT MOBILITY TESTING:  DNT  PALPATION:  DNT   TREATMENT: [facility] Adult PT Treatment:                                                DATE: 12/23/2021 Therapeutic Exercise: Tennis ball squeeze x 5 - 5" hold Wrist circles x 20 Wrist flex/ext x 20 Wirst radial/ulnar dev x 20  PATIENT EDUCATION: Education details: eval findings, FOTO, HEP, POC Person educated: Patient Education method: Explanation, Demonstration, and Handouts Education comprehension: verbalized understanding and returned demonstration  HOME EXERCISE PROGRAM: Access Code: [email or web address] URL: [email or web address] Date: 12/23/2021 Prepared by: Edwinna Areola  Exercises - Seated Gripping Towel  - 3 x daily - 7 x weekly - 3 sets - 10 reps - 5 sec hold - Wrist Circumduction AROM  - 3 x daily - 7 x weekly - 3 sets - 20 reps - Wrist AROM  Flexion Extension  - 3 x daily - 7 x weekly - 3 sets - 20 reps - Wrist Radial Ulnar Deviation AROM  - 3 x daily - 7 x weekly - 3 sets - 20 reps  ASSESSMENT:  CLINICAL IMPRESSION: Patient is a 61 y.o. F who was  seen today for physical therapy evaluation and treatment for s/p R shoulder scope with distal clavicectomy and RTC repair. Physical findings are consistent with surgery and recovery timeline as pt demonstrates decrease in R shoulder PROM. Her FOTO score indicates sharp decline in functional ability below PLOF. She requires skilled PT services post surgery in order to improve get back to PLOF and improve functional ability.  OBJECTIVE IMPAIRMENTS: decreased activity tolerance, decreased ROM, decreased strength, impaired UE functional use, and pain.   ACTIVITY LIMITATIONS: carrying, lifting, continence, bathing, toileting, and reach over head  PARTICIPATION LIMITATIONS: driving, shopping, community activity, and yard work  PERSONAL FACTORS: Fitness and 3+ comorbidities: HTN, DM II, Depression  are also affecting patient's functional outcome.   REHAB POTENTIAL: Excellent  CLINICAL DECISION MAKING: Stable/uncomplicated  EVALUATION COMPLEXITY: Low   GOALS: Goals reviewed with patient? No  SHORT TERM GOALS: Target date: 01/13/2022   Pt will be compliant and knowledgeable with initial HEP for improved comfort and carryover Baseline: initial HEP given  Goal status: INITIAL  2.  Pt will self report right shoulder pain no greater than 6/10 for improved comfort and functional ability Baseline: 10/10 at worst Goal status: INITIAL   LONG TERM GOALS: Target date: 03/17/2022   Pt will improve FOTO function score to no less than 50% as proxy for functional improvement Baseline: 4% function Goal status: INITIAL   2.  Pt will self report right shoulder pain no greater than 3/10 for improved comfort and functional ability Baseline: 10/10 at worst Goal status: INITIAL   3.  Pt will improve R shoulder flex/abd AROM to no less than 140 in order to improve functional ability with ADLs Baseline: see chart Goal status: INITIAL  4.  Pt will be able to reach overhead into cabinets not limited by  pain for improved ADL performance Baseline: unable Goal status: INITIAL  PLAN:  PT FREQUENCY: 2x/week  PT DURATION: 12 weeks  PLANNED INTERVENTIONS: Therapeutic exercises, Therapeutic activity, Neuromuscular re-education, Balance training, Gait training, Patient/Family education, Self Care, Joint mobilization, Electrical stimulation, Cryotherapy, Moist heat, Vasopneumatic device, Manual therapy, and Re-evaluation  PLAN FOR NEXT SESSION: assess HEP response, PROM to R shoulder, vaso R shoulder   Eloy End, PT 12/23/2021, 1:56 PM

## 2021-12-24 NOTE — Therapy (Signed)
OUTPATIENT PHYSICAL THERAPY TREATMENT NOTE   Patient Name: Tracy Daugherty MRN: [id number] DOB:1960-04-26, 61 y.o., female Today's Date: 12/25/2021  PCP: Kennith Maes, PA-C  REFERRING PROVIDER: Frederico Hamman, MD   END OF SESSION:   PT End of Session - 12/25/21 1152     Visit Number 2    Number of Visits 24    Date for PT Re-Evaluation 03/17/22    Authorization Type [employer]HC Medicare/Medicaid    PT Start Time 1215    PT Stop Time 1255    PT Time Calculation (min) 40 min    Activity Tolerance Patient tolerated treatment well    Behavior During Therapy WFL for tasks assessed/performed             Past Medical History:  Diagnosis Date   Arthritis    knees, ankles   Bronchitis    uses inhaler prn   Depression    Diabetes mellitus without complication (HCC)    type 2   GERD (gastroesophageal reflux disease)    Headache    controlled with meds   Hepatitis 2014   Hep C - was treated   HLD (hyperlipidemia)    Hypertension    Hyperthyroidism    RLL pneumonia    x 1   Seasonal allergies    Sleep apnea    uses cpap nightly   Wears glasses    Wears partial dentures    upper   Past Surgical History:  Procedure Laterality Date   ABDOMINAL HYSTERECTOMY     ANTERIOR (CYSTOCELE) AND POSTERIOR REPAIR (RECTOCELE) WITH XENFORM GRAFT AND SACROSPINOUS FIXATION  2013   ANTERIOR CERVICAL DECOMPRESSION/DISCECTOMY FUSION 4 LEVELS N/A 04/02/2020   Procedure: CERVICAL THREE-FOUR, CERVICAL FOUR-FIVE, CERVICAL FIVE-SIX, CERVICAL SIX-SEVEN ANTERIOR CERVICAL DECOMPRESSION/DISCECTOMY FUSION;  Surgeon: Donalee Citrin, MD;  Location: [facility] OR;  Service: Neurosurgery;  Laterality: N/A;   CHOLECYSTECTOMY     COLONOSCOPY     right sided transverse abdominas plane block under ultrasound guidance  2018   TUBAL LIGATION     UPPER GI ENDOSCOPY     WISDOM TOOTH EXTRACTION  1980s   Patient Active Problem List   Diagnosis Date Noted   Spinal stenosis in cervical region 04/02/2020    REFERRING DIAG:   P/O RT SHLD SCOPE DISTAL CLAVICLECTOMY POSS OPEN ROTATOR CUFF REPAIR    THERAPY DIAG:  Acute pain of right shoulder  Muscle weakness (generalized)  Localized edema  Rationale for Evaluation and Treatment Rehabilitation  PERTINENT HISTORY: HTN, DM II, Depression   PRECAUTIONS: Shoulder  ONSET DATE: 12/18/2021   SUBJECTIVE:  SUBJECTIVE STATEMENT:  Patient reports that she saw Dr. Madelon Lips yesterday and he told her to DC her sling and to begin moving her arm as much as possible.   PAIN:  Are you having pain?  Yes: NPRS scale: 7/10 Worst: 10/10 Pain location: R anterior shoulder  Pain description: sharp, post surgical Aggravating factors: lying down Relieving factors: positioning    OBJECTIVE: (objective measures completed at initial evaluation unless otherwise dated)   DIAGNOSTIC FINDINGS:  See imaging    PATIENT SURVEYS:  FOTO: 4% function; 50% predicted    COGNITION: Overall cognitive status: Within functional limits for tasks assessed                                  SENSATION: WFL   POSTURE: Rounded shoulders, fwd head, sling donned on R UE   UPPER EXTREMITY ROM:    Passive ROM Right eval Left eval  Shoulder flexion 45    Shoulder extension      Shoulder abduction 30    Shoulder adduction      Shoulder internal rotation 40    Shoulder external rotation 10    Elbow flexion      Elbow extension      Wrist flexion      Wrist extension      Wrist ulnar deviation      Wrist radial deviation      Wrist pronation      Wrist supination      (Blank rows = not tested)   UPPER EXTREMITY MMT:   MMT Right eval Left eval  Shoulder flexion      Shoulder extension      Shoulder abduction      Shoulder adduction      Shoulder internal rotation      Shoulder external rotation       Middle trapezius      Lower trapezius      Elbow flexion      Elbow extension      Wrist flexion      Wrist extension      Wrist ulnar deviation      Wrist radial deviation      Wrist pronation      Wrist supination      Grip strength (lbs)      (Blank rows = not tested)   SHOULDER SPECIAL TESTS: Impingement tests: DNT SLAP lesions: DNT Instability tests: DNT Rotator cuff assessment: DNT Biceps assessment: DNT   JOINT MOBILITY TESTING:  DNT   PALPATION:  DNT             TREATMENT: [facility] Adult PT Treatment:                                                DATE: 12/25/21 Therapeutic Exercise: Tennis ball squeeze x 10 - 5" hold Wrist circles x 20 CW/CCW Wrist flex/ext x 20 Wirst radial/ulnar dev x 20 Elbow flexion/extension tennis ball 2x10 Supine chest press with dowel x10 Manual Therapy: PROM ER/IR, abduction, flexion Modalities: Location:  right shoulder Time:  10 minutes Pressure:  low Temperature:  34 degrees   [facility] Adult PT Treatment:  DATE: 12/23/2021 Therapeutic Exercise: Tennis ball squeeze x 5 - 5" hold Wrist circles x 20 Wrist flex/ext x 20 Wirst radial/ulnar dev x 20   PATIENT EDUCATION: Education details: eval findings, FOTO, HEP, POC Person educated: Patient Education method: Explanation, Demonstration, and Handouts Education comprehension: verbalized understanding and returned demonstration   HOME EXERCISE PROGRAM: Access Code: [email or web address] URL: [email or web address] Date: 12/23/2021 Prepared by: Edwinna Areola   Exercises - Seated Gripping Towel  - 3 x daily - 7 x weekly - 3 sets - 10 reps - 5 sec hold - Wrist Circumduction AROM  - 3 x daily - 7 x weekly - 3 sets - 20 reps - Wrist AROM Flexion Extension  - 3 x daily - 7 x weekly - 3 sets - 20 reps - Wrist Radial Ulnar Deviation AROM  - 3 x daily - 7 x weekly - 3 sets - 20 reps   ASSESSMENT:   CLINICAL IMPRESSION: Patient presents  to PT with reports of pain in her R shoulder and reports that Dr. Madelon Lips informed her to DC sling and begin moving arm as of yesterday. Spoke with supervising PT and also obtained physical copy of patients notes from appointment with Dr. Madelon Lips yesterday. Report states that patient had right shoulder arthroscopy, acromioplasty, and distal clavicle arthroscopic debridement which differs from original referral stating a rotator cuff repair was performed. Session today focused on early mobility and PROM within patients pain tolerance and use of vaso at end of session for pain control. She is limited by pain throughout session, needing frequent rest breaks. Patient continues to benefit from skilled PT services and should be progressed as able to improve functional independence.   OBJECTIVE IMPAIRMENTS: decreased activity tolerance, decreased ROM, decreased strength, impaired UE functional use, and pain.    ACTIVITY LIMITATIONS: carrying, lifting, continence, bathing, toileting, and reach over head   PARTICIPATION LIMITATIONS: driving, shopping, community activity, and yard work   PERSONAL FACTORS: Fitness and 3+ comorbidities: HTN, DM II, Depression  are also affecting patient's functional outcome.    REHAB POTENTIAL: Excellent   CLINICAL DECISION MAKING: Stable/uncomplicated   EVALUATION COMPLEXITY: Low     GOALS: Goals reviewed with patient? No   SHORT TERM GOALS: Target date: 01/13/2022   Pt will be compliant and knowledgeable with initial HEP for improved comfort and carryover Baseline: initial HEP given  Goal status: INITIAL   2.  Pt will self report right shoulder pain no greater than 6/10 for improved comfort and functional ability Baseline: 10/10 at worst Goal status: INITIAL    LONG TERM GOALS: Target date: 03/17/2022   Pt will improve FOTO function score to no less than 50% as proxy for functional improvement Baseline: 4% function Goal status: INITIAL    2.  Pt will self  report right shoulder pain no greater than 3/10 for improved comfort and functional ability Baseline: 10/10 at worst Goal status: INITIAL    3.  Pt will improve R shoulder flex/abd AROM to no less than 140 in order to improve functional ability with ADLs Baseline: see chart Goal status: INITIAL   4.  Pt will be able to reach overhead into cabinets not limited by pain for improved ADL performance Baseline: unable Goal status: INITIAL   PLAN:   PT FREQUENCY: 2x/week   PT DURATION: 12 weeks   PLANNED INTERVENTIONS: Therapeutic exercises, Therapeutic activity, Neuromuscular re-education, Balance training, Gait training, Patient/Family education, Self Care, Joint mobilization, Electrical stimulation, Cryotherapy, Moist heat, Vasopneumatic device, Manual therapy, and Re-evaluation  PLAN FOR NEXT SESSION: assess HEP response, PROM to R shoulder, vaso R shoulder   Berta MinorStephanie Williams, PTA 12/25/2021, 12:50 PM

## 2021-12-25 ENCOUNTER — Ambulatory Visit: Payer: Medicare Other

## 2021-12-25 DIAGNOSIS — M6281 Muscle weakness (generalized): Secondary | ICD-10-CM

## 2021-12-25 DIAGNOSIS — M25511 Pain in right shoulder: Secondary | ICD-10-CM

## 2021-12-25 DIAGNOSIS — R600 Localized edema: Secondary | ICD-10-CM

## 2021-12-31 ENCOUNTER — Encounter: Payer: Self-pay | Admitting: Physical Therapy

## 2021-12-31 ENCOUNTER — Ambulatory Visit: Payer: Medicare Other | Admitting: Physical Therapy

## 2021-12-31 DIAGNOSIS — R600 Localized edema: Secondary | ICD-10-CM

## 2021-12-31 DIAGNOSIS — M6281 Muscle weakness (generalized): Secondary | ICD-10-CM

## 2021-12-31 DIAGNOSIS — M25511 Pain in right shoulder: Secondary | ICD-10-CM | POA: Diagnosis not present

## 2021-12-31 NOTE — Therapy (Signed)
OUTPATIENT PHYSICAL THERAPY TREATMENT NOTE   Patient Name: Tracy Daugherty MRN: [id number] DOB:23-Sep-1960, 61 y.o., female Today's Date: 12/31/2021  PCP: Kennith Maes, PA-C  REFERRING PROVIDER: Frederico Hamman, MD   END OF SESSION:   PT End of Session - 12/31/21 1217     Visit Number 3    Number of Visits 24    Date for PT Re-Evaluation 03/17/22    Authorization Type UHC Medicare/Medicaid    PT Start Time 1216    PT Stop Time 1256    PT Time Calculation (min) 40 min    Activity Tolerance Patient tolerated treatment well    Behavior During Therapy WFL for tasks assessed/performed             Past Medical History:  Diagnosis Date   Arthritis    knees, ankles   Bronchitis    uses inhaler prn   Depression    Diabetes mellitus without complication (HCC)    type 2   GERD (gastroesophageal reflux disease)    Headache    controlled with meds   Hepatitis 2014   Hep C - was treated   HLD (hyperlipidemia)    Hypertension    Hyperthyroidism    RLL pneumonia    x 1   Seasonal allergies    Sleep apnea    uses cpap nightly   Wears glasses    Wears partial dentures    upper   Past Surgical History:  Procedure Laterality Date   ABDOMINAL HYSTERECTOMY     ANTERIOR (CYSTOCELE) AND POSTERIOR REPAIR (RECTOCELE) WITH XENFORM GRAFT AND SACROSPINOUS FIXATION  2013   ANTERIOR CERVICAL DECOMPRESSION/DISCECTOMY FUSION 4 LEVELS N/A 04/02/2020   Procedure: CERVICAL THREE-FOUR, CERVICAL FOUR-FIVE, CERVICAL FIVE-SIX, CERVICAL SIX-SEVEN ANTERIOR CERVICAL DECOMPRESSION/DISCECTOMY FUSION;  Surgeon: Donalee Citrin, MD;  Location: [facility] OR;  Service: Neurosurgery;  Laterality: N/A;   CHOLECYSTECTOMY     COLONOSCOPY     right sided transverse abdominas plane block under ultrasound guidance  2018   TUBAL LIGATION     UPPER GI ENDOSCOPY     WISDOM TOOTH EXTRACTION  1980s   Patient Active Problem List   Diagnosis Date Noted   Spinal stenosis in cervical region 04/02/2020    REFERRING DIAG:   P/O RT SHLD SCOPE DISTAL CLAVICLECTOMY POSS OPEN ROTATOR CUFF REPAIR    THERAPY DIAG:  Acute pain of right shoulder  Muscle weakness (generalized)  Localized edema  Rationale for Evaluation and Treatment Rehabilitation  PERTINENT HISTORY: HTN, DM II, Depression   PRECAUTIONS: Shoulder  ONSET DATE: 12/18/2021   SUBJECTIVE:  SUBJECTIVE STATEMENT:  Pt states that she just had her stitches removed.  She feels some improvement in her shoulder, but it still is hurting quite a bit.   PAIN:  Are you having pain?  Yes: NPRS scale: 5/10 Worst: 10/10 Pain location: R anterior shoulder  Pain description: sharp, post surgical Aggravating factors: lying down Relieving factors: positioning    OBJECTIVE: (objective measures completed at initial evaluation unless otherwise dated)   DIAGNOSTIC FINDINGS:  See imaging    PATIENT SURVEYS:  FOTO: 4% function; 50% predicted    COGNITION: Overall cognitive status: Within functional limits for tasks assessed                                  SENSATION: WFL   POSTURE: Rounded shoulders, fwd head, sling donned on R UE   UPPER EXTREMITY ROM:    Passive ROM Right eval Left eval  Shoulder flexion 45    Shoulder extension      Shoulder abduction 30    Shoulder adduction      Shoulder internal rotation 40    Shoulder external rotation 10    Elbow flexion      Elbow extension      Wrist flexion      Wrist extension      Wrist ulnar deviation      Wrist radial deviation      Wrist pronation      Wrist supination      (Blank rows = not tested)   UPPER EXTREMITY MMT:   MMT Right eval Left eval  Shoulder flexion      Shoulder extension      Shoulder abduction      Shoulder adduction      Shoulder internal rotation      Shoulder external rotation       Middle trapezius      Lower trapezius      Elbow flexion      Elbow extension      Wrist flexion      Wrist extension      Wrist ulnar deviation      Wrist radial deviation      Wrist pronation      Wrist supination      Grip strength (lbs)      (Blank rows = not tested)   SHOULDER SPECIAL TESTS: Impingement tests: DNT SLAP lesions: DNT Instability tests: DNT Rotator cuff assessment: DNT Biceps assessment: DNT   JOINT MOBILITY TESTING:  DNT   PALPATION:  DNT             TREATMENT: [facility] Adult PT Treatment:                                                DATE: 12/31/21 Therapeutic Exercise: UBE 5' fwd and backward for warm up while taking subjective Supine chest press with dowel 2x10 Supine shoulder flexion with dowel 2x10 (minimally painful arc)  Manual Therapy: PROM ER/IR, flexion STM R pec major  Modalities: Location:  right shoulder Time:  10 minutes Pressure:  low Temperature:  34 degrees   [facility] Adult PT Treatment:  DATE: 12/23/2021 Therapeutic Exercise: Tennis ball squeeze x 5 - 5" hold Wrist circles x 20 Wrist flex/ext x 20 Wirst radial/ulnar dev x 20   PATIENT EDUCATION: Education details: eval findings, FOTO, HEP, POC Person educated: Patient Education method: Explanation, Demonstration, and Handouts Education comprehension: verbalized understanding and returned demonstration   HOME EXERCISE PROGRAM: Access Code: [email or web address] URL: [email or web address] Date: 12/23/2021 Prepared by: Edwinna Areola   Exercises - Seated Gripping Towel  - 3 x daily - 7 x weekly - 3 sets - 10 reps - 5 sec hold - Wrist Circumduction AROM  - 3 x daily - 7 x weekly - 3 sets - 20 reps - Wrist AROM Flexion Extension  - 3 x daily - 7 x weekly - 3 sets - 20 reps - Wrist Radial Ulnar Deviation AROM  - 3 x daily - 7 x weekly - 3 sets - 20 reps   ASSESSMENT:   CLINICAL IMPRESSION: Amazin tolerated session well  with no adverse reaction.  We concentrated on shoulder ROM and basic strengthening.  She is somewhat limited by pain, but shows improved tolerance to therex compared to last visit.  Her ROM is already functional passively which is encouraging.  OBJECTIVE IMPAIRMENTS: decreased activity tolerance, decreased ROM, decreased strength, impaired UE functional use, and pain.    ACTIVITY LIMITATIONS: carrying, lifting, continence, bathing, toileting, and reach over head   PARTICIPATION LIMITATIONS: driving, shopping, community activity, and yard work   PERSONAL FACTORS: Fitness and 3+ comorbidities: HTN, DM II, Depression  are also affecting patient's functional outcome.    REHAB POTENTIAL: Excellent   CLINICAL DECISION MAKING: Stable/uncomplicated   EVALUATION COMPLEXITY: Low     GOALS: Goals reviewed with patient? No   SHORT TERM GOALS: Target date: 01/13/2022   Pt will be compliant and knowledgeable with initial HEP for improved comfort and carryover Baseline: initial HEP given  Goal status: INITIAL   2.  Pt will self report right shoulder pain no greater than 6/10 for improved comfort and functional ability Baseline: 10/10 at worst Goal status: INITIAL    LONG TERM GOALS: Target date: 03/17/2022   Pt will improve FOTO function score to no less than 50% as proxy for functional improvement Baseline: 4% function Goal status: INITIAL    2.  Pt will self report right shoulder pain no greater than 3/10 for improved comfort and functional ability Baseline: 10/10 at worst Goal status: INITIAL    3.  Pt will improve R shoulder flex/abd AROM to no less than 140 in order to improve functional ability with ADLs Baseline: see chart Goal status: INITIAL   4.  Pt will be able to reach overhead into cabinets not limited by pain for improved ADL performance Baseline: unable Goal status: INITIAL   PLAN:   PT FREQUENCY: 2x/week   PT DURATION: 12 weeks   PLANNED INTERVENTIONS:  Therapeutic exercises, Therapeutic activity, Neuromuscular re-education, Balance training, Gait training, Patient/Family education, Self Care, Joint mobilization, Electrical stimulation, Cryotherapy, Moist heat, Vasopneumatic device, Manual therapy, and Re-evaluation   PLAN FOR NEXT SESSION: assess HEP response, PROM to R shoulder, vaso R shoulder   Fredderick Phenix, PT 12/31/2021, 12:50 PM

## 2022-01-01 NOTE — Therapy (Signed)
OUTPATIENT PHYSICAL THERAPY TREATMENT NOTE   Patient Name: Tracy DikesRevonda H Steeves MRN: [id number] DOB:Jan 05, 1961, 61 y.o., female Today's Date: 01/02/2022  PCP: Kennith MaesGast, Tyler, PA-C  REFERRING PROVIDER: Frederico Hammanaffrey, Daniel, MD   END OF SESSION:   PT End of Session - 01/02/22 1010     Visit Number 4    Number of Visits 24    Date for PT Re-Evaluation 03/17/22    Authorization Type UHC Medicare/Medicaid    PT Start Time 1045    PT Stop Time 1125    PT Time Calculation (min) 40 min    Activity Tolerance Patient tolerated treatment well    Behavior During Therapy WFL for tasks assessed/performed              Past Medical History:  Diagnosis Date   Arthritis    knees, ankles   Bronchitis    uses inhaler prn   Depression    Diabetes mellitus without complication (HCC)    type 2   GERD (gastroesophageal reflux disease)    Headache    controlled with meds   Hepatitis 2014   Hep C - was treated   HLD (hyperlipidemia)    Hypertension    Hyperthyroidism    RLL pneumonia    x 1   Seasonal allergies    Sleep apnea    uses cpap nightly   Wears glasses    Wears partial dentures    upper   Past Surgical History:  Procedure Laterality Date   ABDOMINAL HYSTERECTOMY     ANTERIOR (CYSTOCELE) AND POSTERIOR REPAIR (RECTOCELE) WITH XENFORM GRAFT AND SACROSPINOUS FIXATION  2013   ANTERIOR CERVICAL DECOMPRESSION/DISCECTOMY FUSION 4 LEVELS N/A 04/02/2020   Procedure: CERVICAL THREE-FOUR, CERVICAL FOUR-FIVE, CERVICAL FIVE-SIX, CERVICAL SIX-SEVEN ANTERIOR CERVICAL DECOMPRESSION/DISCECTOMY FUSION;  Surgeon: Donalee Citrinram, Gary, MD;  Location: [facility] OR;  Service: Neurosurgery;  Laterality: N/A;   CHOLECYSTECTOMY     COLONOSCOPY     right sided transverse abdominas plane block under ultrasound guidance  2018   TUBAL LIGATION     UPPER GI ENDOSCOPY     WISDOM TOOTH EXTRACTION  1980s   Patient Active Problem List   Diagnosis Date Noted   Spinal stenosis in cervical region 04/02/2020    REFERRING  DIAG:  P/O RT SHLD SCOPE DISTAL CLAVICLECTOMY POSS OPEN ROTATOR CUFF REPAIR    THERAPY DIAG:  Acute pain of right shoulder  Muscle weakness (generalized)  Localized edema  Rationale for Evaluation and Treatment Rehabilitation  PERTINENT HISTORY: HTN, DM II, Depression   PRECAUTIONS: Shoulder  ONSET DATE: 12/18/2021   SUBJECTIVE:  SUBJECTIVE STATEMENT:  Pt presents to PT with reports of decreased pain in R shoulder. Has been compliant with HEP with no adverse effect. Pt is ready to begin PT at this time.    PAIN:  Are you having pain?  Yes: NPRS scale: 4/10 Worst: 10/10 Pain location: R anterior shoulder  Pain description: sharp, post surgical Aggravating factors: lying down Relieving factors: positioning    OBJECTIVE: (objective measures completed at initial evaluation unless otherwise dated)   DIAGNOSTIC FINDINGS:  See imaging    PATIENT SURVEYS:  FOTO: 4% function; 50% predicted    COGNITION: Overall cognitive status: Within functional limits for tasks assessed                                  SENSATION: WFL   POSTURE: Rounded shoulders, fwd head, sling donned on R UE   UPPER EXTREMITY ROM:    Passive ROM Right eval Right 12/14  Shoulder flexion 45  140  Shoulder extension      Shoulder abduction 30  95  Shoulder adduction      Shoulder internal rotation 40  80  Shoulder external rotation 10  60  Elbow flexion      Elbow extension      Wrist flexion      Wrist extension      Wrist ulnar deviation      Wrist radial deviation      Wrist pronation      Wrist supination      (Blank rows = not tested)   UPPER EXTREMITY MMT:   MMT Right eval Left eval  Shoulder flexion      Shoulder extension      Shoulder abduction      Shoulder adduction      Shoulder internal  rotation      Shoulder external rotation      Middle trapezius      Lower trapezius      Elbow flexion      Elbow extension      Wrist flexion      Wrist extension      Wrist ulnar deviation      Wrist radial deviation      Wrist pronation      Wrist supination      Grip strength (lbs)      (Blank rows = not tested)   SHOULDER SPECIAL TESTS: Impingement tests: DNT SLAP lesions: DNT Instability tests: DNT Rotator cuff assessment: DNT Biceps assessment: DNT   JOINT MOBILITY TESTING:  DNT   PALPATION:  DNT             TREATMENT: [facility] Adult PT Treatment:                                                DATE: 01/02/2022 Therapeutic Exercise: UBE 5' fwd and backward for warm up while taking subjective Supine chest press with dowel 2x10 Shoulder flexion with pulleys x 2 min Row 2x10 YTBs Seated table slide 2x10 R shoulder flexion/scaption Supine shoulder flexion with dowel 2x10 (minimally painful arc) Manual Therapy: PROM ER/IR, flexion STM R pec major Modalities: Location:  right shoulder Time:  10 minutes Pressure:  low Temperature:  34 degrees  [facility] Adult PT Treatment:  DATE: 12/31/2021 Therapeutic Exercise: UBE 5' fwd and backward for warm up while taking subjective Supine chest press with dowel 2x10 Supine shoulder flexion with dowel 2x10 (minimally painful arc) Manual Therapy: PROM ER/IR, flexion STM R pec major Modalities: Location:  right shoulder Time:  10 minutes Pressure:  low Temperature:  34 degrees   [facility] Adult PT Treatment:                                                DATE: 12/23/2021 Therapeutic Exercise: Tennis ball squeeze x 5 - 5" hold Wrist circles x 20 Wrist flex/ext x 20 Wirst radial/ulnar dev x 20   PATIENT EDUCATION: Education details: eval findings, FOTO, HEP, POC Person educated: Patient Education method: Explanation, Demonstration, and Handouts Education comprehension: verbalized  understanding and returned demonstration   HOME EXERCISE PROGRAM: Access Code: [email or web address] URL: [email or web address] Date: 12/23/2021 Prepared by: Edwinna Areola   Exercises - Seated Gripping Towel  - 3 x daily - 7 x weekly - 3 sets - 10 reps - 5 sec hold - Wrist Circumduction AROM  - 3 x daily - 7 x weekly - 3 sets - 20 reps - Wrist AROM Flexion Extension  - 3 x daily - 7 x weekly - 3 sets - 20 reps - Wrist Radial Ulnar Deviation AROM  - 3 x daily - 7 x weekly - 3 sets - 20 reps   ASSESSMENT:   CLINICAL IMPRESSION: Pt able to complete prescribed exercises with no adverse effect or increase in pain. Therapy focused on improving R shoulder strength and ROM. She is progressing well with therapy to this point, will continue to progress as able per POC.   OBJECTIVE IMPAIRMENTS: decreased activity tolerance, decreased ROM, decreased strength, impaired UE functional use, and pain.    ACTIVITY LIMITATIONS: carrying, lifting, continence, bathing, toileting, and reach over head   PARTICIPATION LIMITATIONS: driving, shopping, community activity, and yard work   PERSONAL FACTORS: Fitness and 3+ comorbidities: HTN, DM II, Depression  are also affecting patient's functional outcome.      GOALS: Goals reviewed with patient? No   SHORT TERM GOALS: Target date: 01/13/2022   Pt will be compliant and knowledgeable with initial HEP for improved comfort and carryover Baseline: initial HEP given  Goal status: INITIAL   2.  Pt will self report right shoulder pain no greater than 6/10 for improved comfort and functional ability Baseline: 10/10 at worst Goal status: INITIAL    LONG TERM GOALS: Target date: 03/17/2022   Pt will improve FOTO function score to no less than 50% as proxy for functional improvement Baseline: 4% function Goal status: INITIAL    2.  Pt will self report right shoulder pain no greater than 3/10 for improved comfort and functional ability Baseline: 10/10 at  worst Goal status: INITIAL    3.  Pt will improve R shoulder flex/abd AROM to no less than 140 in order to improve functional ability with ADLs Baseline: see chart Goal status: INITIAL   4.  Pt will be able to reach overhead into cabinets not limited by pain for improved ADL performance Baseline: unable Goal status: INITIAL   PLAN:   PT FREQUENCY: 2x/week   PT DURATION: 12 weeks   PLANNED INTERVENTIONS: Therapeutic exercises, Therapeutic activity, Neuromuscular re-education, Balance training, Gait training, Patient/Family education, Self Care, Joint mobilization, Electrical stimulation, Cryotherapy, Moist  heat, Vasopneumatic device, Manual therapy, and Re-evaluation   PLAN FOR NEXT SESSION: assess HEP response, PROM to R shoulder, vaso R shoulder   Eloy End, PT 01/02/2022, 12:21 PM

## 2022-01-02 ENCOUNTER — Ambulatory Visit: Payer: Medicare Other

## 2022-01-02 DIAGNOSIS — M25511 Pain in right shoulder: Secondary | ICD-10-CM

## 2022-01-02 DIAGNOSIS — M6281 Muscle weakness (generalized): Secondary | ICD-10-CM

## 2022-01-02 DIAGNOSIS — R600 Localized edema: Secondary | ICD-10-CM

## 2022-01-06 NOTE — Therapy (Signed)
OUTPATIENT PHYSICAL THERAPY TREATMENT NOTE   Patient Name: Tracy Daugherty MRN: [id number] DOB:November 30, 1960, 61 y.o., female Today's Date: 01/07/2022  PCP: Tito Dine, PA-C  REFERRING PROVIDER: Earlie Server, MD   END OF SESSION:   PT End of Session - 01/07/22 1213     Visit Number 5    Number of Visits 24    Date for PT Re-Evaluation 03/17/22    Authorization Type UHC Medicare/Medicaid    PT Start Time 1215    PT Stop Time 1255    PT Time Calculation (min) 40 min    Activity Tolerance Patient tolerated treatment well;Patient limited by pain    Behavior During Therapy WFL for tasks assessed/performed               Past Medical History:  Diagnosis Date   Arthritis    knees, ankles   Bronchitis    uses inhaler prn   Depression    Diabetes mellitus without complication ([place])    type 2   GERD (gastroesophageal reflux disease)    Headache    controlled with meds   Hepatitis 2014   Hep C - was treated   HLD (hyperlipidemia)    Hypertension    Hyperthyroidism    RLL pneumonia    x 1   Seasonal allergies    Sleep apnea    uses cpap nightly   Wears glasses    Wears partial dentures    upper   Past Surgical History:  Procedure Laterality Date   ABDOMINAL HYSTERECTOMY     ANTERIOR (CYSTOCELE) AND POSTERIOR REPAIR (RECTOCELE) WITH XENFORM GRAFT AND SACROSPINOUS FIXATION  2013   ANTERIOR CERVICAL DECOMPRESSION/DISCECTOMY FUSION 4 LEVELS N/A 04/02/2020   Procedure: CERVICAL THREE-FOUR, CERVICAL FOUR-FIVE, CERVICAL FIVE-SIX, CERVICAL SIX-SEVEN ANTERIOR CERVICAL DECOMPRESSION/DISCECTOMY FUSION;  Surgeon: Kary Kos, MD;  Location: [place];  Service: Neurosurgery;  Laterality: N/A;   CHOLECYSTECTOMY     COLONOSCOPY     right sided transverse abdominas plane block under ultrasound guidance  2018   TUBAL LIGATION     UPPER GI ENDOSCOPY     WISDOM TOOTH EXTRACTION  1980s   Patient Active Problem List   Diagnosis Date Noted   Spinal stenosis in cervical region  04/02/2020    REFERRING DIAG:  P/O RT SHLD SCOPE DISTAL CLAVICLECTOMY POSS OPEN ROTATOR CUFF REPAIR    THERAPY DIAG:  Acute pain of right shoulder  Muscle weakness (generalized)  Localized edema  Rationale for Evaluation and Treatment Rehabilitation  PERTINENT HISTORY: HTN, DM II, Depression   PRECAUTIONS: Shoulder  ONSET DATE: 12/18/2021   SUBJECTIVE:  SUBJECTIVE STATEMENT:  Patient reports high pain and a headache today.   PAIN:  Are you having pain?  Yes: NPRS scale: 10/10 Worst: 10/10 Pain location: R anterior shoulder  Pain description: sharp, post surgical Aggravating factors: lying down Relieving factors: positioning    OBJECTIVE: (objective measures completed at initial evaluation unless otherwise dated)   DIAGNOSTIC FINDINGS:  See imaging    PATIENT SURVEYS:  FOTO: 4% function; 50% predicted    COGNITION: Overall cognitive status: Within functional limits for tasks assessed                                  SENSATION: WFL   POSTURE: Rounded shoulders, fwd head, sling donned on R UE   UPPER EXTREMITY ROM:    Passive ROM Right eval Right 12/14  Shoulder flexion 45  140  Shoulder extension      Shoulder abduction 30  95  Shoulder adduction      Shoulder internal rotation 40  80  Shoulder external rotation 10  60  Elbow flexion      Elbow extension      Wrist flexion      Wrist extension      Wrist ulnar deviation      Wrist radial deviation      Wrist pronation      Wrist supination      (Blank rows = not tested)   UPPER EXTREMITY MMT:   MMT Right eval Left eval  Shoulder flexion      Shoulder extension      Shoulder abduction      Shoulder adduction      Shoulder internal rotation      Shoulder external rotation      Middle trapezius      Lower  trapezius      Elbow flexion      Elbow extension      Wrist flexion      Wrist extension      Wrist ulnar deviation      Wrist radial deviation      Wrist pronation      Wrist supination      Grip strength (lbs)      (Blank rows = not tested)   SHOULDER SPECIAL TESTS: Impingement tests: DNT SLAP lesions: DNT Instability tests: DNT Rotator cuff assessment: DNT Biceps assessment: DNT   JOINT MOBILITY TESTING:  DNT   PALPATION:  DNT             TREATMENT: [facility] Adult PT Treatment:                                                DATE: 01/07/2022 Therapeutic Exercise: UBE level 1 3/3 fwd and backward for warm up while taking subjective Supine chest press with dowel 2x10 Shoulder flexion with pulleys x 2 min Row 3x10 YTBs Seated table slide 2x10 R shoulder flexion/scaption Supine shoulder flexion with dowel 2x10 (minimally painful arc) Supine chest press AROM Rt with tennis ball x10 Manual Therapy: PROM ER/IR, flexion STM R pec major Modalities: Location:  right shoulder Time:  10 minutes Pressure:  low Temperature:  34 degrees  [facility] Adult PT Treatment:  DATE: 01/02/2022 Therapeutic Exercise: UBE 5' fwd and backward for warm up while taking subjective Supine chest press with dowel 2x10 Shoulder flexion with pulleys x 2 min Row 2x10 YTBs Seated table slide 2x10 R shoulder flexion/scaption Supine shoulder flexion with dowel 2x10 (minimally painful arc) Manual Therapy: PROM ER/IR, flexion STM R pec major Modalities: Location:  right shoulder Time:  10 minutes Pressure:  low Temperature:  34 degrees  [facility] Adult PT Treatment:                                                DATE: 12/31/2021 Therapeutic Exercise: UBE 5' fwd and backward for warm up while taking subjective Supine chest press with dowel 2x10 Supine shoulder flexion with dowel 2x10 (minimally painful arc) Manual Therapy: PROM ER/IR, flexion STM R pec  major Modalities: Location:  right shoulder Time:  10 minutes Pressure:  low Temperature:  34 degrees    PATIENT EDUCATION: Education details: eval findings, FOTO, HEP, POC Person educated: Patient Education method: Explanation, Demonstration, and Handouts Education comprehension: verbalized understanding and returned demonstration   HOME EXERCISE PROGRAM: Access Code: [email or web address] URL: [email or web address] Date: 12/23/2021 Prepared by: Edwinna Areola   Exercises - Seated Gripping Towel  - 3 x daily - 7 x weekly - 3 sets - 10 reps - 5 sec hold - Wrist Circumduction AROM  - 3 x daily - 7 x weekly - 3 sets - 20 reps - Wrist AROM Flexion Extension  - 3 x daily - 7 x weekly - 3 sets - 20 reps - Wrist Radial Ulnar Deviation AROM  - 3 x daily - 7 x weekly - 3 sets - 20 reps   ASSESSMENT:   CLINICAL IMPRESSION: Patient presents to PT with high pain today that she says woke her up in the middle of the night and she also has a headache. She attributes some of the pain to recent rain and also from folding laundry last night. Session today continued to focus on Rt shoulder strengthening and ROM. She was limited by pain throughout session, especially with AAROM and AROM. Patient continues to benefit from skilled PT services and should be progressed as able to improve functional independence.   OBJECTIVE IMPAIRMENTS: decreased activity tolerance, decreased ROM, decreased strength, impaired UE functional use, and pain.    ACTIVITY LIMITATIONS: carrying, lifting, continence, bathing, toileting, and reach over head   PARTICIPATION LIMITATIONS: driving, shopping, community activity, and yard work   PERSONAL FACTORS: Fitness and 3+ comorbidities: HTN, DM II, Depression  are also affecting patient's functional outcome.      GOALS: Goals reviewed with patient? No   SHORT TERM GOALS: Target date: 01/13/2022   Pt will be compliant and knowledgeable with initial HEP for improved  comfort and carryover Baseline: initial HEP given  Goal status: INITIAL   2.  Pt will self report right shoulder pain no greater than 6/10 for improved comfort and functional ability Baseline: 10/10 at worst Goal status: INITIAL    LONG TERM GOALS: Target date: 03/17/2022   Pt will improve FOTO function score to no less than 50% as proxy for functional improvement Baseline: 4% function Goal status: INITIAL    2.  Pt will self report right shoulder pain no greater than 3/10 for improved comfort and functional ability Baseline: 10/10 at worst Goal status: INITIAL    3.  Pt will improve R shoulder flex/abd AROM to no less than 140 in order to improve functional ability with ADLs Baseline: see chart Goal status: INITIAL   4.  Pt will be able to reach overhead into cabinets not limited by pain for improved ADL performance Baseline: unable Goal status: INITIAL   PLAN:   PT FREQUENCY: 2x/week   PT DURATION: 12 weeks   PLANNED INTERVENTIONS: Therapeutic exercises, Therapeutic activity, Neuromuscular re-education, Balance training, Gait training, Patient/Family education, Self Care, Joint mobilization, Electrical stimulation, Cryotherapy, Moist heat, Vasopneumatic device, Manual therapy, and Re-evaluation   PLAN FOR NEXT SESSION: assess HEP response, PROM to R shoulder, vaso R shoulder, strengthening and ROM R shoulder   Margarette Canada, PTA 01/07/2022, 12:59 PM

## 2022-01-07 ENCOUNTER — Ambulatory Visit: Payer: Medicare Other

## 2022-01-07 DIAGNOSIS — R600 Localized edema: Secondary | ICD-10-CM

## 2022-01-07 DIAGNOSIS — M25511 Pain in right shoulder: Secondary | ICD-10-CM

## 2022-01-07 DIAGNOSIS — M6281 Muscle weakness (generalized): Secondary | ICD-10-CM

## 2022-01-08 NOTE — Therapy (Signed)
OUTPATIENT PHYSICAL THERAPY TREATMENT NOTE   Patient Name: Tracy Daugherty MRN: [id number] DOB:1960/08/13, 61 y.o., female Today's Date: 01/09/2022  PCP: Kennith Maes, PA-C  REFERRING PROVIDER: Frederico Hamman, MD   END OF SESSION:   PT End of Session - 01/09/22 1214     Visit Number 6    Number of Visits 24    Date for PT Re-Evaluation 03/17/22    Authorization Type UHC Medicare/Medicaid    PT Start Time 1215    PT Stop Time 1255    PT Time Calculation (min) 40 min    Activity Tolerance Patient tolerated treatment well;Patient limited by pain    Behavior During Therapy WFL for tasks assessed/performed                Past Medical History:  Diagnosis Date   Arthritis    knees, ankles   Bronchitis    uses inhaler prn   Depression    Diabetes mellitus without complication ([facility])    type 2   GERD (gastroesophageal reflux disease)    Headache    controlled with meds   Hepatitis 2014   Hep C - was treated   HLD (hyperlipidemia)    Hypertension    Hyperthyroidism    RLL pneumonia    x 1   Seasonal allergies    Sleep apnea    uses cpap nightly   Wears glasses    Wears partial dentures    upper   Past Surgical History:  Procedure Laterality Date   ABDOMINAL HYSTERECTOMY     ANTERIOR (CYSTOCELE) AND POSTERIOR REPAIR (RECTOCELE) WITH XENFORM GRAFT AND SACROSPINOUS FIXATION  2013   ANTERIOR CERVICAL DECOMPRESSION/DISCECTOMY FUSION 4 LEVELS N/A 04/02/2020   Procedure: CERVICAL THREE-FOUR, CERVICAL FOUR-FIVE, CERVICAL FIVE-SIX, CERVICAL SIX-SEVEN ANTERIOR CERVICAL DECOMPRESSION/DISCECTOMY FUSION;  Surgeon: Donalee Citrin, MD;  Location: [facility];  Service: Neurosurgery;  Laterality: N/A;   CHOLECYSTECTOMY     COLONOSCOPY     right sided transverse abdominas plane block under ultrasound guidance  2018   TUBAL LIGATION     UPPER GI ENDOSCOPY     WISDOM TOOTH EXTRACTION  1980s   Patient Active Problem List   Diagnosis Date Noted   Spinal stenosis in cervical region  04/02/2020    REFERRING DIAG:  P/O RT SHLD SCOPE DISTAL CLAVICLECTOMY POSS OPEN ROTATOR CUFF REPAIR    THERAPY DIAG:  Acute pain of right shoulder  Muscle weakness (generalized)  Localized edema  Rationale for Evaluation and Treatment Rehabilitation  PERTINENT HISTORY: HTN, DM II, Depression   PRECAUTIONS: Shoulder  ONSET DATE: 12/18/2021   SUBJECTIVE:  SUBJECTIVE STATEMENT:  Patient reports continued pain and some lightheadedness today.    PAIN:  Are you having pain?  Yes: NPRS scale: 8/10 Worst: 10/10 Pain location: R anterior shoulder  Pain description: sharp, post surgical Aggravating factors: lying down Relieving factors: positioning    OBJECTIVE: (objective measures completed at initial evaluation unless otherwise dated)   DIAGNOSTIC FINDINGS:  See imaging    PATIENT SURVEYS:  FOTO: 4% function; 50% predicted    COGNITION: Overall cognitive status: Within functional limits for tasks assessed                                  SENSATION: WFL   POSTURE: Rounded shoulders, fwd head, sling donned on R UE   UPPER EXTREMITY ROM:    Passive ROM Right eval Right 12/14  Shoulder flexion 45  140  Shoulder extension      Shoulder abduction 30  95  Shoulder adduction      Shoulder internal rotation 40  80  Shoulder external rotation 10  60  Elbow flexion      Elbow extension      Wrist flexion      Wrist extension      Wrist ulnar deviation      Wrist radial deviation      Wrist pronation      Wrist supination      (Blank rows = not tested)   UPPER EXTREMITY MMT:   MMT Right eval Left eval  Shoulder flexion      Shoulder extension      Shoulder abduction      Shoulder adduction      Shoulder internal rotation      Shoulder external rotation      Middle trapezius       Lower trapezius      Elbow flexion      Elbow extension      Wrist flexion      Wrist extension      Wrist ulnar deviation      Wrist radial deviation      Wrist pronation      Wrist supination      Grip strength (lbs)      (Blank rows = not tested)   SHOULDER SPECIAL TESTS: Impingement tests: DNT SLAP lesions: DNT Instability tests: DNT Rotator cuff assessment: DNT Biceps assessment: DNT   JOINT MOBILITY TESTING:  DNT   PALPATION:  DNT             TREATMENT: [facility] Adult PT Treatment:                                                DATE: 01/09/2022 Therapeutic Exercise: UBE level 1 3/3 fwd and backward for warm up while taking subjective Supine chest press with dowel 2x10 Shoulder flexion with pulleys x 2 min Row 3x10 YTB Shoulder extension YTB 3x10 Supine shoulder flexion with dowel 2x10 (minimally painful arc) Supine chest press AROM Rt with tennis ball 2x10 Supine shoulder flexion AROM with tennis ball? x3  (too painful, only about 10 lift) Manual Therapy: PROM ER/IR, flexion STM R pec major Modalities: Location:  right shoulder Time:  10 minutes Pressure:  low Temperature:  34 degrees  [facility] Adult PT Treatment:  DATE: 01/07/2022 Therapeutic Exercise: UBE level 1 3/3 fwd and backward for warm up while taking subjective Supine chest press with dowel 2x10 Shoulder flexion with pulleys x 2 min Row 3x10 YTBs Seated table slide 2x10 R shoulder flexion/scaption Supine shoulder flexion with dowel 2x10 (minimally painful arc) Supine chest press AROM Rt with tennis ball x10 Manual Therapy: PROM ER/IR, flexion STM R pec major Modalities: Location:  right shoulder Time:  10 minutes Pressure:  low Temperature:  34 degrees  [facility] Adult PT Treatment:                                                DATE: 01/02/2022 Therapeutic Exercise: UBE 5' fwd and backward for warm up while taking subjective Supine chest press  with dowel 2x10 Shoulder flexion with pulleys x 2 min Row 2x10 YTBs Seated table slide 2x10 R shoulder flexion/scaption Supine shoulder flexion with dowel 2x10 (minimally painful arc) Manual Therapy: PROM ER/IR, flexion STM R pec major Modalities: Location:  right shoulder Time:  10 minutes Pressure:  low Temperature:  34 degrees    PATIENT EDUCATION: Education details: eval findings, FOTO, HEP, POC Person educated: Patient Education method: Explanation, Demonstration, and Handouts Education comprehension: verbalized understanding and returned demonstration   HOME EXERCISE PROGRAM: Access Code: [email or web address] URL: [email or web address] Date: 12/23/2021 Prepared by: Edwinna Areolaavid Stroup   Exercises - Seated Gripping Towel  - 3 x daily - 7 x weekly - 3 sets - 10 reps - 5 sec hold - Wrist Circumduction AROM  - 3 x daily - 7 x weekly - 3 sets - 20 reps - Wrist AROM Flexion Extension  - 3 x daily - 7 x weekly - 3 sets - 20 reps - Wrist Radial Ulnar Deviation AROM  - 3 x daily - 7 x weekly - 3 sets - 20 reps   ASSESSMENT:   CLINICAL IMPRESSION: Patient presents to PT with continued reports of high pain and states her headache is better but with some residual lightheadedness. Session today continued to focus on Rt shoulder strengthening and ROM. She was limited by pain throughout session, especially with AAROM and AROM. Patient continues to benefit from skilled PT services and should be progressed as able to improve functional independence.    OBJECTIVE IMPAIRMENTS: decreased activity tolerance, decreased ROM, decreased strength, impaired UE functional use, and pain.    ACTIVITY LIMITATIONS: carrying, lifting, continence, bathing, toileting, and reach over head   PARTICIPATION LIMITATIONS: driving, shopping, community activity, and yard work   PERSONAL FACTORS: Fitness and 3+ comorbidities: HTN, DM II, Depression  are also affecting patient's functional outcome.       GOALS: Goals reviewed with patient? No   SHORT TERM GOALS: Target date: 01/13/2022   Pt will be compliant and knowledgeable with initial HEP for improved comfort and carryover Baseline: initial HEP given  Goal status: INITIAL   2.  Pt will self report right shoulder pain no greater than 6/10 for improved comfort and functional ability Baseline: 10/10 at worst Goal status: INITIAL    LONG TERM GOALS: Target date: 03/17/2022   Pt will improve FOTO function score to no less than 50% as proxy for functional improvement Baseline: 4% function Goal status: INITIAL    2.  Pt will self report right shoulder pain no greater than 3/10 for improved comfort and functional ability Baseline: 10/10 at worst  Goal status: INITIAL    3.  Pt will improve R shoulder flex/abd AROM to no less than 140 in order to improve functional ability with ADLs Baseline: see chart Goal status: INITIAL   4.  Pt will be able to reach overhead into cabinets not limited by pain for improved ADL performance Baseline: unable Goal status: INITIAL   PLAN:   PT FREQUENCY: 2x/week   PT DURATION: 12 weeks   PLANNED INTERVENTIONS: Therapeutic exercises, Therapeutic activity, Neuromuscular re-education, Balance training, Gait training, Patient/Family education, Self Care, Joint mobilization, Electrical stimulation, Cryotherapy, Moist heat, Vasopneumatic device, Manual therapy, and Re-evaluation   PLAN FOR NEXT SESSION: assess HEP response, PROM to R shoulder, vaso R shoulder, strengthening and ROM R shoulder   Berta Minor, PTA 01/09/2022, 12:14 PM

## 2022-01-09 ENCOUNTER — Ambulatory Visit: Payer: Medicare Other

## 2022-01-09 DIAGNOSIS — M25511 Pain in right shoulder: Secondary | ICD-10-CM | POA: Diagnosis not present

## 2022-01-09 DIAGNOSIS — R600 Localized edema: Secondary | ICD-10-CM

## 2022-01-09 DIAGNOSIS — M6281 Muscle weakness (generalized): Secondary | ICD-10-CM

## 2022-01-14 ENCOUNTER — Ambulatory Visit: Payer: Medicare Other

## 2022-01-14 DIAGNOSIS — M25511 Pain in right shoulder: Secondary | ICD-10-CM | POA: Diagnosis not present

## 2022-01-14 DIAGNOSIS — M6281 Muscle weakness (generalized): Secondary | ICD-10-CM

## 2022-01-14 DIAGNOSIS — R600 Localized edema: Secondary | ICD-10-CM

## 2022-01-14 NOTE — Therapy (Signed)
OUTPATIENT PHYSICAL THERAPY TREATMENT NOTE   Patient Name: Tracy Daugherty MRN: [id number] DOB:[birth year], 61 y.o., female Today's Date: 01/14/2022  PCP: Kennith Maes, PA-C  REFERRING PROVIDER: Frederico Hamman, MD   END OF SESSION:   PT End of Session - 01/14/22 1120     Visit Number 7    Number of Visits 24    Date for PT Re-Evaluation 03/17/22    Authorization Type [employer] Medicare/Medicaid    PT Start Time 1125    PT Stop Time 1208    PT Time Calculation (min) 43 min    Activity Tolerance Patient tolerated treatment well;Patient limited by pain    Behavior During Therapy WFL for tasks assessed/performed                 Past Medical History:  Diagnosis Date   Arthritis    knees, ankles   Bronchitis    uses inhaler prn   Depression    Diabetes mellitus without complication (HCC)    type 2   GERD (gastroesophageal reflux disease)    Headache    controlled with meds   Hepatitis 2014   Hep C - was treated   HLD (hyperlipidemia)    Hypertension    Hyperthyroidism    RLL pneumonia    x 1   Seasonal allergies    Sleep apnea    uses cpap nightly   Wears glasses    Wears partial dentures    upper   Past Surgical History:  Procedure Laterality Date   ABDOMINAL HYSTERECTOMY     ANTERIOR (CYSTOCELE) AND POSTERIOR REPAIR (RECTOCELE) WITH XENFORM GRAFT AND SACROSPINOUS FIXATION  2013   ANTERIOR CERVICAL DECOMPRESSION/DISCECTOMY FUSION 4 LEVELS N/A 04/02/2020   Procedure: CERVICAL THREE-FOUR, CERVICAL FOUR-FIVE, CERVICAL FIVE-SIX, CERVICAL SIX-SEVEN ANTERIOR CERVICAL DECOMPRESSION/DISCECTOMY FUSION;  Surgeon: Donalee Citrin, MD;  Location: [facility] OR;  Service: Neurosurgery;  Laterality: N/A;   CHOLECYSTECTOMY     COLONOSCOPY     right sided transverse abdominas plane block under ultrasound guidance  2018   TUBAL LIGATION     UPPER GI ENDOSCOPY     WISDOM TOOTH EXTRACTION  1980s   Patient Active Problem List   Diagnosis Date Noted   Spinal stenosis in cervical region  04/02/2020    REFERRING DIAG:  P/O RT SHLD SCOPE DISTAL CLAVICLECTOMY POSS OPEN ROTATOR CUFF REPAIR    THERAPY DIAG:  Acute pain of right shoulder  Muscle weakness (generalized)  Localized edema  Rationale for Evaluation and Treatment Rehabilitation  PERTINENT HISTORY: HTN, DM II, Depression   PRECAUTIONS: Shoulder  ONSET DATE: 12/18/2021   SUBJECTIVE:  SUBJECTIVE STATEMENT:  Pt presents to PT with continued R shoulder pain. Has been compliant with HEP with no adverse effect. Pt is ready to begin PT at this time.    PAIN:  Are you having pain?  Yes: NPRS scale: 5/10 Worst: 10/10 Pain location: R anterior shoulder  Pain description: sharp, post surgical Aggravating factors: lying down Relieving factors: positioning    OBJECTIVE: (objective measures completed at initial evaluation unless otherwise dated)   DIAGNOSTIC FINDINGS:  See imaging    PATIENT SURVEYS:  FOTO: 4% function; 50% predicted    COGNITION: Overall cognitive status: Within functional limits for tasks assessed                                  SENSATION: WFL   POSTURE: Rounded shoulders, fwd head, sling donned on R UE   UPPER EXTREMITY ROM:    Passive ROM Right eval Right 12/14  Shoulder flexion 45  140  Shoulder extension      Shoulder abduction 30  95  Shoulder adduction      Shoulder internal rotation 40  80  Shoulder external rotation 10  60  Elbow flexion      Elbow extension      Wrist flexion      Wrist extension      Wrist ulnar deviation      Wrist radial deviation      Wrist pronation      Wrist supination      (Blank rows = not tested)   UPPER EXTREMITY MMT:   MMT Right eval Left eval  Shoulder flexion      Shoulder extension      Shoulder abduction      Shoulder adduction       Shoulder internal rotation      Shoulder external rotation      Middle trapezius      Lower trapezius      Elbow flexion      Elbow extension      Wrist flexion      Wrist extension      Wrist ulnar deviation      Wrist radial deviation      Wrist pronation      Wrist supination      Grip strength (lbs)      (Blank rows = not tested)   SHOULDER SPECIAL TESTS: Impingement tests: DNT SLAP lesions: DNT Instability tests: DNT Rotator cuff assessment: DNT Biceps assessment: DNT   JOINT MOBILITY TESTING:  DNT   PALPATION:  DNT             TREATMENT: [facility] Adult PT Treatment:                                                DATE: 01/14/2022 Therapeutic Exercise: UBE level 1 2/2 fwd and backward for warm up while taking subjective Shoulder flexion with pulleys x 2 min Row 3x10 RTB Shoulder extension RTB 2x10 Supine shoulder flexion with dowel 2x10 (minimally painful arc) Supine chest press with dowel x 10 (pain) Supine horizontal abd 2x10 YTB (pain) R shoulder ER/IR isometric against manual resistance 2x10 - 3" hold Manual Therapy: PROM ER/IR, flexion STM R pec major Modalities: Location:  right shoulder Time:  10 minutes Pressure:  low Temperature:  34 degrees  [facility] Adult PT Treatment:                                                DATE: 01/09/2022 Therapeutic Exercise: UBE level 1 3/3 fwd and backward for warm up while taking subjective Supine chest press with dowel 2x10 Shoulder flexion with pulleys x 2 min Row 3x10 YTB Shoulder extension YTB 3x10 Supine shoulder flexion with dowel 2x10 (minimally painful arc) Supine chest press AROM Rt with tennis ball 2x10 Supine shoulder flexion AROM with tennis ball? x3  (too painful, only about 10 lift) Manual Therapy: PROM ER/IR, flexion STM R pec major Modalities: Location:  right shoulder Time:  10 minutes Pressure:  low Temperature:  34 degrees  [facility] Adult PT Treatment:                                                 DATE: 01/07/2022 Therapeutic Exercise: UBE level 1 3/3 fwd and backward for warm up while taking subjective Supine chest press with dowel 2x10 Shoulder flexion with pulleys x 2 min Row 3x10 YTBs Seated table slide 2x10 R shoulder flexion/scaption Supine shoulder flexion with dowel 2x10 (minimally painful arc) Supine chest press AROM Rt with tennis ball x10 Manual Therapy: PROM ER/IR, flexion STM R pec major Modalities: Location:  right shoulder Time:  10 minutes Pressure:  low Temperature:  34 degrees  PATIENT EDUCATION: Education details: continue HEP Person educated: Patient Education method: Explanation, Demonstration, and Handouts Education comprehension: verbalized understanding and returned demonstration   HOME EXERCISE PROGRAM: Access Code: [email or web address] URL: [email or web address] Date: 12/23/2021 Prepared by: Edwinna Areola   Exercises - Seated Gripping Towel  - 3 x daily - 7 x weekly - 3 sets - 10 reps - 5 sec hold - Wrist Circumduction AROM  - 3 x daily - 7 x weekly - 3 sets - 20 reps - Wrist AROM Flexion Extension  - 3 x daily - 7 x weekly - 3 sets - 20 reps - Wrist Radial Ulnar Deviation AROM  - 3 x daily - 7 x weekly - 3 sets - 20 reps   ASSESSMENT:   CLINICAL IMPRESSION: Pt able to complete prescribed exercises with no adverse effect or increase in pain. Therapy focused on improving R shoulder strength and ROM post op, with introduction of isometric RTC exercises 50% against PT manual resistance. She is progressing well with therapy but continues to have a good bit of post op pain. PT will continue to progress as able per POC.     OBJECTIVE IMPAIRMENTS: decreased activity tolerance, decreased ROM, decreased strength, impaired UE functional use, and pain.    ACTIVITY LIMITATIONS: carrying, lifting, continence, bathing, toileting, and reach over head   PARTICIPATION LIMITATIONS: driving, shopping, community activity, and yard work   PERSONAL  FACTORS: Fitness and 3+ comorbidities: HTN, DM II, Depression  are also affecting patient's functional outcome.      GOALS: Goals reviewed with patient? No   SHORT TERM GOALS: Target date: 01/13/2022   Pt will be compliant and knowledgeable with initial HEP for improved comfort and carryover Baseline: initial HEP given  Goal status: INITIAL   2.  Pt will self report right shoulder pain no greater than  6/10 for improved comfort and functional ability Baseline: 10/10 at worst Goal status: INITIAL    LONG TERM GOALS: Target date: 03/17/2022   Pt will improve FOTO function score to no less than 50% as proxy for functional improvement Baseline: 4% function Goal status: INITIAL    2.  Pt will self report right shoulder pain no greater than 3/10 for improved comfort and functional ability Baseline: 10/10 at worst Goal status: INITIAL    3.  Pt will improve R shoulder flex/abd AROM to no less than 140 in order to improve functional ability with ADLs Baseline: see chart Goal status: INITIAL   4.  Pt will be able to reach overhead into cabinets not limited by pain for improved ADL performance Baseline: unable Goal status: INITIAL   PLAN:   PT FREQUENCY: 2x/week   PT DURATION: 12 weeks   PLANNED INTERVENTIONS: Therapeutic exercises, Therapeutic activity, Neuromuscular re-education, Balance training, Gait training, Patient/Family education, Self Care, Joint mobilization, Electrical stimulation, Cryotherapy, Moist heat, Vasopneumatic device, Manual therapy, and Re-evaluation   PLAN FOR NEXT SESSION: assess HEP response, PROM to R shoulder, vaso R shoulder, strengthening and ROM R shoulder   Eloy End, PT 01/14/2022, 12:12 PM

## 2022-01-27 ENCOUNTER — Ambulatory Visit: Payer: 59

## 2022-01-27 NOTE — Therapy (Incomplete)
OUTPATIENT PHYSICAL THERAPY TREATMENT NOTE   Patient Name: Tracy Daugherty MRN: [id number] DOB:11-14-1960, 63 y.o., female Today's Date: 01/27/2022  PCP: Kennith Maes, PA-C  REFERRING PROVIDER: Frederico Hamman, MD   END OF SESSION:         Past Medical History:  Diagnosis Date   Arthritis    knees, ankles   Bronchitis    uses inhaler prn   Depression    Diabetes mellitus without complication (HCC)    type 2   GERD (gastroesophageal reflux disease)    Headache    controlled with meds   Hepatitis 2014   Hep C - was treated   HLD (hyperlipidemia)    Hypertension    Hyperthyroidism    RLL pneumonia    x 1   Seasonal allergies    Sleep apnea    uses cpap nightly   Wears glasses    Wears partial dentures    upper   Past Surgical History:  Procedure Laterality Date   ABDOMINAL HYSTERECTOMY     ANTERIOR (CYSTOCELE) AND POSTERIOR REPAIR (RECTOCELE) WITH XENFORM GRAFT AND SACROSPINOUS FIXATION  2013   ANTERIOR CERVICAL DECOMPRESSION/DISCECTOMY FUSION 4 LEVELS N/A 04/02/2020   Procedure: CERVICAL THREE-FOUR, CERVICAL FOUR-FIVE, CERVICAL FIVE-SIX, CERVICAL SIX-SEVEN ANTERIOR CERVICAL DECOMPRESSION/DISCECTOMY FUSION;  Surgeon: Donalee Citrin, MD;  Location: [facility] OR;  Service: Neurosurgery;  Laterality: N/A;   CHOLECYSTECTOMY     COLONOSCOPY     right sided transverse abdominas plane block under ultrasound guidance  2018   TUBAL LIGATION     UPPER GI ENDOSCOPY     WISDOM TOOTH EXTRACTION  1980s   Patient Active Problem List   Diagnosis Date Noted   Spinal stenosis in cervical region 04/02/2020    REFERRING DIAG:  P/O RT SHLD SCOPE DISTAL CLAVICLECTOMY POSS OPEN ROTATOR CUFF REPAIR    THERAPY DIAG:  No diagnosis found.  Rationale for Evaluation and Treatment Rehabilitation  PERTINENT HISTORY: HTN, DM II, Depression   PRECAUTIONS: Shoulder  ONSET DATE: 12/18/2021   SUBJECTIVE:                                                                                                                                                                                       SUBJECTIVE STATEMENT:  ***   PAIN:  Are you having pain?  Yes: NPRS scale: 5/10 Worst: 10/10 Pain location: R anterior shoulder  Pain description: sharp, post surgical Aggravating factors: lying down Relieving factors: positioning    OBJECTIVE: (objective measures completed at initial evaluation unless otherwise dated)   DIAGNOSTIC FINDINGS:  See imaging    PATIENT SURVEYS:  FOTO: 4% function; 50% predicted    COGNITION:  Overall cognitive status: Within functional limits for tasks assessed                                  SENSATION: WFL   POSTURE: Rounded shoulders, fwd head, sling donned on R UE   UPPER EXTREMITY ROM:    Passive ROM Right eval Right 12/14  Shoulder flexion 45  140  Shoulder extension      Shoulder abduction 30  95  Shoulder adduction      Shoulder internal rotation 40  80  Shoulder external rotation 10  60  Elbow flexion      Elbow extension      Wrist flexion      Wrist extension      Wrist ulnar deviation      Wrist radial deviation      Wrist pronation      Wrist supination      (Blank rows = not tested)   UPPER EXTREMITY MMT:   MMT Right eval Left eval  Shoulder flexion      Shoulder extension      Shoulder abduction      Shoulder adduction      Shoulder internal rotation      Shoulder external rotation      Middle trapezius      Lower trapezius      Elbow flexion      Elbow extension      Wrist flexion      Wrist extension      Wrist ulnar deviation      Wrist radial deviation      Wrist pronation      Wrist supination      Grip strength (lbs)      (Blank rows = not tested)   SHOULDER SPECIAL TESTS: Impingement tests: DNT SLAP lesions: DNT Instability tests: DNT Rotator cuff assessment: DNT Biceps assessment: DNT   JOINT MOBILITY TESTING:  DNT   PALPATION:  DNT             TREATMENT: [facility] Adult PT Treatment:                                                 DATE: 01/14/2022 Therapeutic Exercise: UBE level 1 2/2 fwd and backward for warm up while taking subjective Shoulder flexion with pulleys x 2 min Row 3x10 RTB Shoulder extension RTB 2x10 Supine shoulder flexion with dowel 2x10 (minimally painful arc) Supine chest press with dowel x 10 (pain) Supine horizontal abd 2x10 YTB (pain) R shoulder ER/IR isometric against manual resistance 2x10 - 3" hold Manual Therapy: PROM ER/IR, flexion STM R pec major Modalities: Location:  right shoulder Time:  10 minutes Pressure:  low Temperature:  34 degrees  [facility] Adult PT Treatment:                                                DATE: 01/09/2022 Therapeutic Exercise: UBE level 1 3/3 fwd and backward for warm up while taking subjective Supine chest press with dowel 2x10 Shoulder flexion with pulleys x 2 min Row 3x10 YTB Shoulder extension YTB 3x10 Supine shoulder flexion with dowel 2x10 (minimally  painful arc) Supine chest press AROM Rt with tennis ball 2x10 Supine shoulder flexion AROM with tennis ball? x3  (too painful, only about 10 lift) Manual Therapy: PROM ER/IR, flexion STM R pec major Modalities: Location:  right shoulder Time:  10 minutes Pressure:  low Temperature:  34 degrees  [facility] Adult PT Treatment:                                                DATE: 01/07/2022 Therapeutic Exercise: UBE level 1 3/3 fwd and backward for warm up while taking subjective Supine chest press with dowel 2x10 Shoulder flexion with pulleys x 2 min Row 3x10 YTBs Seated table slide 2x10 R shoulder flexion/scaption Supine shoulder flexion with dowel 2x10 (minimally painful arc) Supine chest press AROM Rt with tennis ball x10 Manual Therapy: PROM ER/IR, flexion STM R pec major Modalities: Location:  right shoulder Time:  10 minutes Pressure:  low Temperature:  34 degrees  PATIENT EDUCATION: Education details: continue HEP Person educated:  Patient Education method: Explanation, Demonstration, and Handouts Education comprehension: verbalized understanding and returned demonstration   HOME EXERCISE PROGRAM: Access Code: [email or web address] URL: [email or web address] Date: 12/23/2021 Prepared by: Edwinna Areola   Exercises - Seated Gripping Towel  - 3 x daily - 7 x weekly - 3 sets - 10 reps - 5 sec hold - Wrist Circumduction AROM  - 3 x daily - 7 x weekly - 3 sets - 20 reps - Wrist AROM Flexion Extension  - 3 x daily - 7 x weekly - 3 sets - 20 reps - Wrist Radial Ulnar Deviation AROM  - 3 x daily - 7 x weekly - 3 sets - 20 reps   ASSESSMENT:   CLINICAL IMPRESSION: ***   OBJECTIVE IMPAIRMENTS: decreased activity tolerance, decreased ROM, decreased strength, impaired UE functional use, and pain.    ACTIVITY LIMITATIONS: carrying, lifting, continence, bathing, toileting, and reach over head   PARTICIPATION LIMITATIONS: driving, shopping, community activity, and yard work   PERSONAL FACTORS: Fitness and 3+ comorbidities: HTN, DM II, Depression  are also affecting patient's functional outcome.      GOALS: Goals reviewed with patient? No   SHORT TERM GOALS: Target date: 01/13/2022   Pt will be compliant and knowledgeable with initial HEP for improved comfort and carryover Baseline: initial HEP given  Goal status: INITIAL   2.  Pt will self report right shoulder pain no greater than 6/10 for improved comfort and functional ability Baseline: 10/10 at worst Goal status: INITIAL    LONG TERM GOALS: Target date: 03/17/2022   Pt will improve FOTO function score to no less than 50% as proxy for functional improvement Baseline: 4% function Goal status: INITIAL    2.  Pt will self report right shoulder pain no greater than 3/10 for improved comfort and functional ability Baseline: 10/10 at worst Goal status: INITIAL    3.  Pt will improve R shoulder flex/abd AROM to no less than 140 in order to improve functional  ability with ADLs Baseline: see chart Goal status: INITIAL   4.  Pt will be able to reach overhead into cabinets not limited by pain for improved ADL performance Baseline: unable Goal status: INITIAL   PLAN:   PT FREQUENCY: 2x/week   PT DURATION: 12 weeks   PLANNED INTERVENTIONS: Therapeutic exercises, Therapeutic activity, Neuromuscular re-education, Balance training, Gait training,  Patient/Family education, Self Care, Joint mobilization, Electrical stimulation, Cryotherapy, Moist heat, Vasopneumatic device, Manual therapy, and Re-evaluation   PLAN FOR NEXT SESSION: assess HEP response, PROM to R shoulder, vaso R shoulder, strengthening and ROM R shoulder   Ward Chatters, PT 01/27/2022, 7:37 AM

## 2022-01-29 ENCOUNTER — Ambulatory Visit: Payer: 59 | Attending: Orthopedic Surgery

## 2022-01-29 DIAGNOSIS — M6281 Muscle weakness (generalized): Secondary | ICD-10-CM | POA: Insufficient documentation

## 2022-01-29 DIAGNOSIS — R600 Localized edema: Secondary | ICD-10-CM | POA: Diagnosis present

## 2022-01-29 DIAGNOSIS — M25511 Pain in right shoulder: Secondary | ICD-10-CM

## 2022-01-29 NOTE — Therapy (Signed)
OUTPATIENT PHYSICAL THERAPY TREATMENT NOTE   Patient Name: Tracy Daugherty MRN: [id number] DOB:1960-03-17, 62 y.o., female Today's Date: 01/29/2022  PCP: Kennith Maes, PA-C  REFERRING PROVIDER: Frederico Hamman, MD   END OF SESSION:   PT End of Session - 01/29/22 1207     Visit Number 8    Number of Visits 24    Date for PT Re-Evaluation 03/17/22    Authorization Type [employer] Medicare/Medicaid    PT Start Time 1215    PT Stop Time 1255    PT Time Calculation (min) 40 min    Activity Tolerance Patient tolerated treatment well;Patient limited by pain    Behavior During Therapy WFL for tasks assessed/performed                  Past Medical History:  Diagnosis Date   Arthritis    knees, ankles   Bronchitis    uses inhaler prn   Depression    Diabetes mellitus without complication (HCC)    type 2   GERD (gastroesophageal reflux disease)    Headache    controlled with meds   Hepatitis 2014   Hep C - was treated   HLD (hyperlipidemia)    Hypertension    Hyperthyroidism    RLL pneumonia    x 1   Seasonal allergies    Sleep apnea    uses cpap nightly   Wears glasses    Wears partial dentures    upper   Past Surgical History:  Procedure Laterality Date   ABDOMINAL HYSTERECTOMY     ANTERIOR (CYSTOCELE) AND POSTERIOR REPAIR (RECTOCELE) WITH XENFORM GRAFT AND SACROSPINOUS FIXATION  2013   ANTERIOR CERVICAL DECOMPRESSION/DISCECTOMY FUSION 4 LEVELS N/A 04/02/2020   Procedure: CERVICAL THREE-FOUR, CERVICAL FOUR-FIVE, CERVICAL FIVE-SIX, CERVICAL SIX-SEVEN ANTERIOR CERVICAL DECOMPRESSION/DISCECTOMY FUSION;  Surgeon: Donalee Citrin, MD;  Location: [facility] OR;  Service: Neurosurgery;  Laterality: N/A;   CHOLECYSTECTOMY     COLONOSCOPY     right sided transverse abdominas plane block under ultrasound guidance  2018   TUBAL LIGATION     UPPER GI ENDOSCOPY     WISDOM TOOTH EXTRACTION  1980s   Patient Active Problem List   Diagnosis Date Noted   Spinal stenosis in cervical region  04/02/2020    REFERRING DIAG:  P/O RT SHLD SCOPE DISTAL CLAVICLECTOMY POSS OPEN ROTATOR CUFF REPAIR    THERAPY DIAG:  Acute pain of right shoulder  Muscle weakness (generalized)  Localized edema  Rationale for Evaluation and Treatment Rehabilitation  PERTINENT HISTORY: HTN, DM II, Depression   PRECAUTIONS: Shoulder  ONSET DATE: 12/18/2021   SUBJECTIVE:  SUBJECTIVE STATEMENT:  Pt presents to PT with reports of continued R shoulder pain. Has been compliant with HEP with no adverse effect. Pt is ready to begin PT at this time.    PAIN:  Are you having pain?  Yes: NPRS scale: 5/10 Worst: 10/10 Pain location: R anterior shoulder  Pain description: sharp, post surgical Aggravating factors: lying down Relieving factors: positioning    OBJECTIVE: (objective measures completed at initial evaluation unless otherwise dated)   DIAGNOSTIC FINDINGS:  See imaging    PATIENT SURVEYS:  FOTO: 4% function; 50% predicted    COGNITION: Overall cognitive status: Within functional limits for tasks assessed                                  SENSATION: WFL   POSTURE: Rounded shoulders, fwd head, sling donned on R UE   UPPER EXTREMITY ROM:    Passive ROM Right eval Right 12/14  Shoulder flexion 45  140  Shoulder extension      Shoulder abduction 30  95  Shoulder adduction      Shoulder internal rotation 40  80  Shoulder external rotation 10  60  Elbow flexion      Elbow extension      Wrist flexion      Wrist extension      Wrist ulnar deviation      Wrist radial deviation      Wrist pronation      Wrist supination      (Blank rows = not tested)   UPPER EXTREMITY MMT:   MMT Right eval Left eval  Shoulder flexion      Shoulder extension      Shoulder abduction      Shoulder adduction       Shoulder internal rotation      Shoulder external rotation      Middle trapezius      Lower trapezius      Elbow flexion      Elbow extension      Wrist flexion      Wrist extension      Wrist ulnar deviation      Wrist radial deviation      Wrist pronation      Wrist supination      Grip strength (lbs)      (Blank rows = not tested)   SHOULDER SPECIAL TESTS: Impingement tests: DNT SLAP lesions: DNT Instability tests: DNT Rotator cuff assessment: DNT Biceps assessment: DNT   JOINT MOBILITY TESTING:  DNT   PALPATION:  DNT             TREATMENT: [facility] Adult PT Treatment:                                                DATE: 01/29/2022 Therapeutic Exercise: UBE level 1 2/2 fwd and backward for warm up while taking subjective Shoulder flexion/scaption with pulleys x 1 min each Row 2x10 GTB R shoulder IR/ER isometric 2x10 - 3" hold Seated cane ER AAROM x 15 - 3" hold Supine shoulder flexion with ball 2x10 (minimally painful arc) Supine horizontal abd 2x10 YTB R shoulder ER/IR isometric against manual resistance 2x10 - 3" hold Manual Therapy: PROM ER/IR, flexion STM R pec major Modalities: Location:  right shoulder Time:  10 minutes Pressure:  low Temperature:  34 degrees  [facility] Adult PT Treatment:                                                DATE: 01/14/2022 Therapeutic Exercise: UBE level 1 2/2 fwd and backward for warm up while taking subjective Shoulder flexion with pulleys x 2 min Row 3x10 RTB Shoulder extension RTB 2x10 Supine shoulder flexion with dowel 2x10 (minimally painful arc) Supine chest press with dowel x 10 (pain) Supine horizontal abd 2x10 YTB (pain) R shoulder ER/IR isometric against manual resistance 2x10 - 3" hold Manual Therapy: PROM ER/IR, flexion STM R pec major Modalities: Location:  right shoulder Time:  10 minutes Pressure:  low Temperature:  34 degrees  [facility] Adult PT Treatment:                                                 DATE: 01/09/2022 Therapeutic Exercise: UBE level 1 3/3 fwd and backward for warm up while taking subjective Supine chest press with dowel 2x10 Shoulder flexion with pulleys x 2 min Row 3x10 YTB Shoulder extension YTB 3x10 Supine shoulder flexion with dowel 2x10 (minimally painful arc) Supine chest press AROM Rt with tennis ball 2x10 Supine shoulder flexion AROM with tennis ball? x3  (too painful, only about 10 lift) Manual Therapy: PROM ER/IR, flexion STM R pec major Modalities: Location:  right shoulder Time:  10 minutes Pressure:  low Temperature:  34 degrees  [facility] Adult PT Treatment:                                                DATE: 01/07/2022 Therapeutic Exercise: UBE level 1 3/3 fwd and backward for warm up while taking subjective Supine chest press with dowel 2x10 Shoulder flexion with pulleys x 2 min Row 3x10 YTBs Seated table slide 2x10 R shoulder flexion/scaption Supine shoulder flexion with dowel 2x10 (minimally painful arc) Supine chest press AROM Rt with tennis ball x10 Manual Therapy: PROM ER/IR, flexion STM R pec major Modalities: Location:  right shoulder Time:  10 minutes Pressure:  low Temperature:  34 degrees  PATIENT EDUCATION: Education details: continue HEP Person educated: Patient Education method: Explanation, Demonstration, and Handouts Education comprehension: verbalized understanding and returned demonstration   HOME EXERCISE PROGRAM: Access Code: [email or web address] URL: [email or web address] Date: 01/29/2022 Prepared by: Octavio Manns  Exercises - Supine Shoulder Flexion Extension AAROM with Dowel  - 1 x daily - 7 x weekly - 2 sets - 10 reps - Seated Scapular Retraction  - 1 x daily - 7 x weekly - 2 sets - 10 reps - Standing Isometric Shoulder Internal Rotation at Doorway  - 1 x daily - 7 x weekly - 2 sets - 10 reps - 3 sec hold - Standing Isometric Shoulder External Rotation with Doorway  - 1 x daily - 7 x weekly - 2 sets - 10  reps - 3 sec hold   ASSESSMENT:   CLINICAL IMPRESSION: Pt able to complete prescribed exercises with no adverse effect or increase in pain. Therapy focused on improving R shoulder strength  and ROM post op. She is progressing well with therapy but continues to have a good bit of post op pain. HEP updated for continued strength and ROM work at home. PT will continue to progress as able per POC.   OBJECTIVE IMPAIRMENTS: decreased activity tolerance, decreased ROM, decreased strength, impaired UE functional use, and pain.    ACTIVITY LIMITATIONS: carrying, lifting, continence, bathing, toileting, and reach over head   PARTICIPATION LIMITATIONS: driving, shopping, community activity, and yard work   PERSONAL FACTORS: Fitness and 3+ comorbidities: HTN, DM II, Depression  are also affecting patient's functional outcome.      GOALS: Goals reviewed with patient? No   SHORT TERM GOALS: Target date: 01/13/2022   Pt will be compliant and knowledgeable with initial HEP for improved comfort and carryover Baseline: initial HEP given  Goal status: INITIAL   2.  Pt will self report right shoulder pain no greater than 6/10 for improved comfort and functional ability Baseline: 10/10 at worst Goal status: INITIAL    LONG TERM GOALS: Target date: 03/17/2022   Pt will improve FOTO function score to no less than 50% as proxy for functional improvement Baseline: 4% function Goal status: INITIAL    2.  Pt will self report right shoulder pain no greater than 3/10 for improved comfort and functional ability Baseline: 10/10 at worst Goal status: INITIAL    3.  Pt will improve R shoulder flex/abd AROM to no less than 140 in order to improve functional ability with ADLs Baseline: see chart Goal status: INITIAL   4.  Pt will be able to reach overhead into cabinets not limited by pain for improved ADL performance Baseline: unable Goal status: INITIAL   PLAN:   PT FREQUENCY: 2x/week   PT DURATION:  12 weeks   PLANNED INTERVENTIONS: Therapeutic exercises, Therapeutic activity, Neuromuscular re-education, Balance training, Gait training, Patient/Family education, Self Care, Joint mobilization, Electrical stimulation, Cryotherapy, Moist heat, Vasopneumatic device, Manual therapy, and Re-evaluation   PLAN FOR NEXT SESSION: assess HEP response, PROM to R shoulder, vaso R shoulder, strengthening and ROM R shoulder   Eloy End, PT 01/29/2022, 1:09 PM

## 2022-02-03 NOTE — Therapy (Incomplete)
OUTPATIENT PHYSICAL THERAPY TREATMENT NOTE   Patient Name: Tracy Daugherty MRN: [id number] DOB:[birth year], 61 y.o., female Today's Date: 02/03/2022  PCP: Kennith Maes, PA-C  REFERRING PROVIDER: Frederico Hamman, MD   END OF SESSION:          Past Medical History:  Diagnosis Date   Arthritis    knees, ankles   Bronchitis    uses inhaler prn   Depression    Diabetes mellitus without complication (HCC)    type 2   GERD (gastroesophageal reflux disease)    Headache    controlled with meds   Hepatitis 2014   Hep C - was treated   HLD (hyperlipidemia)    Hypertension    Hyperthyroidism    RLL pneumonia    x 1   Seasonal allergies    Sleep apnea    uses cpap nightly   Wears glasses    Wears partial dentures    upper   Past Surgical History:  Procedure Laterality Date   ABDOMINAL HYSTERECTOMY     ANTERIOR (CYSTOCELE) AND POSTERIOR REPAIR (RECTOCELE) WITH XENFORM GRAFT AND SACROSPINOUS FIXATION  2013   ANTERIOR CERVICAL DECOMPRESSION/DISCECTOMY FUSION 4 LEVELS N/A 04/02/2020   Procedure: CERVICAL THREE-FOUR, CERVICAL FOUR-FIVE, CERVICAL FIVE-SIX, CERVICAL SIX-SEVEN ANTERIOR CERVICAL DECOMPRESSION/DISCECTOMY FUSION;  Surgeon: Donalee Citrin, MD;  Location: [facility] OR;  Service: Neurosurgery;  Laterality: N/A;   CHOLECYSTECTOMY     COLONOSCOPY     right sided transverse abdominas plane block under ultrasound guidance  2018   TUBAL LIGATION     UPPER GI ENDOSCOPY     WISDOM TOOTH EXTRACTION  1980s   Patient Active Problem List   Diagnosis Date Noted   Spinal stenosis in cervical region 04/02/2020    REFERRING DIAG:  P/O RT SHLD SCOPE DISTAL CLAVICLECTOMY POSS OPEN ROTATOR CUFF REPAIR    THERAPY DIAG:  No diagnosis found.  Rationale for Evaluation and Treatment Rehabilitation  PERTINENT HISTORY: HTN, DM II, Depression   PRECAUTIONS: Shoulder  ONSET DATE: 12/18/2021   SUBJECTIVE:                                                                                                                                                                                       SUBJECTIVE STATEMENT:  ***   PAIN:  Are you having pain?  Yes: NPRS scale: 5/10 Worst: 10/10 Pain location: R anterior shoulder  Pain description: sharp, post surgical Aggravating factors: lying down Relieving factors: positioning    OBJECTIVE: (objective measures completed at initial evaluation unless otherwise dated)   DIAGNOSTIC FINDINGS:  See imaging    PATIENT SURVEYS:  FOTO: 4% function; 50% predicted  COGNITION: Overall cognitive status: Within functional limits for tasks assessed                                  SENSATION: WFL   POSTURE: Rounded shoulders, fwd head, sling donned on R UE   UPPER EXTREMITY ROM:    Passive ROM Right eval Right 12/14  Shoulder flexion 45  140  Shoulder extension      Shoulder abduction 30  95  Shoulder adduction      Shoulder internal rotation 40  80  Shoulder external rotation 10  60  Elbow flexion      Elbow extension      Wrist flexion      Wrist extension      Wrist ulnar deviation      Wrist radial deviation      Wrist pronation      Wrist supination      (Blank rows = not tested)   UPPER EXTREMITY MMT:   MMT Right eval Left eval  Shoulder flexion      Shoulder extension      Shoulder abduction      Shoulder adduction      Shoulder internal rotation      Shoulder external rotation      Middle trapezius      Lower trapezius      Elbow flexion      Elbow extension      Wrist flexion      Wrist extension      Wrist ulnar deviation      Wrist radial deviation      Wrist pronation      Wrist supination      Grip strength (lbs)      (Blank rows = not tested)   SHOULDER SPECIAL TESTS: Impingement tests: DNT SLAP lesions: DNT Instability tests: DNT Rotator cuff assessment: DNT Biceps assessment: DNT   JOINT MOBILITY TESTING:  DNT   PALPATION:  DNT             TREATMENT: [facility] Adult PT Treatment:                                                 DATE: 02/04/2022 Therapeutic Exercise: UBE level 1 2/2 fwd and backward for warm up while taking subjective Shoulder flexion/scaption with pulleys x 1 min each Row 2x10 GTB R shoulder IR/ER isometric 2x10 - 3" hold Seated cane ER AAROM x 15 - 3" hold Supine shoulder flexion with ball 2x10 (minimally painful arc) Supine horizontal abd 2x10 YTB R shoulder ER/IR isometric against manual resistance 2x10 - 3" hold Manual Therapy: PROM ER/IR, flexion STM R pec major Modalities: Location:  right shoulder Time:  10 minutes Pressure:  low Temperature:  34 degrees  [facility] Adult PT Treatment:                                                DATE: 01/29/2022 Therapeutic Exercise: UBE level 1 2/2 fwd and backward for warm up while taking subjective Shoulder flexion/scaption with pulleys x 1 min each Row 2x10 GTB R shoulder IR/ER isometric 2x10 - 3" hold Seated cane  ER AAROM x 15 - 3" hold Supine shoulder flexion with ball 2x10 (minimally painful arc) Supine horizontal abd 2x10 YTB R shoulder ER/IR isometric against manual resistance 2x10 - 3" hold Manual Therapy: PROM ER/IR, flexion STM R pec major Modalities: Location:  right shoulder Time:  10 minutes Pressure:  low Temperature:  34 degrees  [facility] Adult PT Treatment:                                                DATE: 01/14/2022 Therapeutic Exercise: UBE level 1 2/2 fwd and backward for warm up while taking subjective Shoulder flexion with pulleys x 2 min Row 3x10 RTB Shoulder extension RTB 2x10 Supine shoulder flexion with dowel 2x10 (minimally painful arc) Supine chest press with dowel x 10 (pain) Supine horizontal abd 2x10 YTB (pain) R shoulder ER/IR isometric against manual resistance 2x10 - 3" hold Manual Therapy: PROM ER/IR, flexion STM R pec major Modalities: Location:  right shoulder Time:  10 minutes Pressure:  low Temperature:  34 degrees  [facility] Adult PT Treatment:                                                 DATE: 01/09/2022 Therapeutic Exercise: UBE level 1 3/3 fwd and backward for warm up while taking subjective Supine chest press with dowel 2x10 Shoulder flexion with pulleys x 2 min Row 3x10 YTB Shoulder extension YTB 3x10 Supine shoulder flexion with dowel 2x10 (minimally painful arc) Supine chest press AROM Rt with tennis ball 2x10 Supine shoulder flexion AROM with tennis ball? x3  (too painful, only about 10 lift) Manual Therapy: PROM ER/IR, flexion STM R pec major Modalities: Location:  right shoulder Time:  10 minutes Pressure:  low Temperature:  34 degrees  [facility] Adult PT Treatment:                                                DATE: 01/07/2022 Therapeutic Exercise: UBE level 1 3/3 fwd and backward for warm up while taking subjective Supine chest press with dowel 2x10 Shoulder flexion with pulleys x 2 min Row 3x10 YTBs Seated table slide 2x10 R shoulder flexion/scaption Supine shoulder flexion with dowel 2x10 (minimally painful arc) Supine chest press AROM Rt with tennis ball x10 Manual Therapy: PROM ER/IR, flexion STM R pec major Modalities: Location:  right shoulder Time:  10 minutes Pressure:  low Temperature:  34 degrees  PATIENT EDUCATION: Education details: continue HEP Person educated: Patient Education method: Explanation, Demonstration, and Handouts Education comprehension: verbalized understanding and returned demonstration   HOME EXERCISE PROGRAM: Access Code: [email or web address] URL: [email or web address] Date: 01/29/2022 Prepared by: Octavio Manns  Exercises - Supine Shoulder Flexion Extension AAROM with Dowel  - 1 x daily - 7 x weekly - 2 sets - 10 reps - Seated Scapular Retraction  - 1 x daily - 7 x weekly - 2 sets - 10 reps - Standing Isometric Shoulder Internal Rotation at Doorway  - 1 x daily - 7 x weekly - 2 sets - 10 reps - 3 sec hold - Standing Isometric Shoulder External Rotation with Doorway  -  1 x  daily - 7 x weekly - 2 sets - 10 reps - 3 sec hold   ASSESSMENT:   CLINICAL IMPRESSION: ***   OBJECTIVE IMPAIRMENTS: decreased activity tolerance, decreased ROM, decreased strength, impaired UE functional use, and pain.    ACTIVITY LIMITATIONS: carrying, lifting, continence, bathing, toileting, and reach over head   PARTICIPATION LIMITATIONS: driving, shopping, community activity, and yard work   PERSONAL FACTORS: Fitness and 3+ comorbidities: HTN, DM II, Depression  are also affecting patient's functional outcome.      GOALS: Goals reviewed with patient? No   SHORT TERM GOALS: Target date: 01/13/2022   Pt will be compliant and knowledgeable with initial HEP for improved comfort and carryover Baseline: initial HEP given  Goal status: INITIAL   2.  Pt will self report right shoulder pain no greater than 6/10 for improved comfort and functional ability Baseline: 10/10 at worst Goal status: INITIAL    LONG TERM GOALS: Target date: 03/17/2022   Pt will improve FOTO function score to no less than 50% as proxy for functional improvement Baseline: 4% function Goal status: INITIAL    2.  Pt will self report right shoulder pain no greater than 3/10 for improved comfort and functional ability Baseline: 10/10 at worst Goal status: INITIAL    3.  Pt will improve R shoulder flex/abd AROM to no less than 140 in order to improve functional ability with ADLs Baseline: see chart Goal status: INITIAL   4.  Pt will be able to reach overhead into cabinets not limited by pain for improved ADL performance Baseline: unable Goal status: INITIAL   PLAN:   PT FREQUENCY: 2x/week   PT DURATION: 12 weeks   PLANNED INTERVENTIONS: Therapeutic exercises, Therapeutic activity, Neuromuscular re-education, Balance training, Gait training, Patient/Family education, Self Care, Joint mobilization, Electrical stimulation, Cryotherapy, Moist heat, Vasopneumatic device, Manual therapy, and  Re-evaluation   PLAN FOR NEXT SESSION: assess HEP response, PROM to R shoulder, vaso R shoulder, strengthening and ROM R shoulder   Ward Chatters, PT 02/03/2022, 1:41 PM

## 2022-02-04 ENCOUNTER — Ambulatory Visit: Payer: 59

## 2022-02-06 ENCOUNTER — Ambulatory Visit: Payer: 59

## 2022-02-06 DIAGNOSIS — M6281 Muscle weakness (generalized): Secondary | ICD-10-CM

## 2022-02-06 DIAGNOSIS — M25511 Pain in right shoulder: Secondary | ICD-10-CM

## 2022-02-06 NOTE — Therapy (Signed)
OUTPATIENT PHYSICAL THERAPY TREATMENT NOTE   Patient Name: Tracy Daugherty MRN: [id number] DOB:[birth year], 62 y.o., female Today's Date: 02/06/2022  PCP: Tito Dine, PA-C  REFERRING PROVIDER: Earlie Server, MD   END OF SESSION:   PT End of Session - 02/06/22 1210     Visit Number 9    Number of Visits 24    Date for PT Re-Evaluation 03/17/22    Authorization Type UHC Medicare/Medicaid    PT Start Time 1215    PT Stop Time 1255    PT Time Calculation (min) 40 min    Activity Tolerance Patient tolerated treatment well;Patient limited by pain    Behavior During Therapy WFL for tasks assessed/performed                   Past Medical History:  Diagnosis Date   Arthritis    knees, ankles   Bronchitis    uses inhaler prn   Depression    Diabetes mellitus without complication ([place])    type 2   GERD (gastroesophageal reflux disease)    Headache    controlled with meds   Hepatitis 2014   Hep C - was treated   HLD (hyperlipidemia)    Hypertension    Hyperthyroidism    RLL pneumonia    x 1   Seasonal allergies    Sleep apnea    uses cpap nightly   Wears glasses    Wears partial dentures    upper   Past Surgical History:  Procedure Laterality Date   ABDOMINAL HYSTERECTOMY     ANTERIOR (CYSTOCELE) AND POSTERIOR REPAIR (RECTOCELE) WITH XENFORM GRAFT AND SACROSPINOUS FIXATION  2013   ANTERIOR CERVICAL DECOMPRESSION/DISCECTOMY FUSION 4 LEVELS N/A 04/02/2020   Procedure: CERVICAL THREE-FOUR, CERVICAL FOUR-FIVE, CERVICAL FIVE-SIX, CERVICAL SIX-SEVEN ANTERIOR CERVICAL DECOMPRESSION/DISCECTOMY FUSION;  Surgeon: Kary Kos, MD;  Location: [facility];  Service: Neurosurgery;  Laterality: N/A;   CHOLECYSTECTOMY     COLONOSCOPY     right sided transverse abdominas plane block under ultrasound guidance  2018   TUBAL LIGATION     UPPER GI ENDOSCOPY     WISDOM TOOTH EXTRACTION  1980s   Patient Active Problem List   Diagnosis Date Noted   Spinal stenosis in cervical  region 04/02/2020    REFERRING DIAG:  P/O RT SHLD SCOPE DISTAL CLAVICLECTOMY POSS OPEN ROTATOR CUFF REPAIR    THERAPY DIAG:  Acute pain of right shoulder  Muscle weakness (generalized)  Rationale for Evaluation and Treatment Rehabilitation  PERTINENT HISTORY: HTN, DM II, Depression   PRECAUTIONS: Shoulder  ONSET DATE: 12/18/2021   SUBJECTIVE:  SUBJECTIVE STATEMENT:  Pt presents to PT with reports of continued shoulder pain. Has been compliant with HEP with no adverse effect. Pt is ready to begin PT at this time.    PAIN:  Are you having pain?  Yes: NPRS scale: 4/10 Worst: 10/10 Pain location: R anterior shoulder  Pain description: sharp, post surgical Aggravating factors: lying down Relieving factors: positioning    OBJECTIVE: (objective measures completed at initial evaluation unless otherwise dated)   DIAGNOSTIC FINDINGS:  See imaging    PATIENT SURVEYS:  FOTO: 4% function; 50% predicted    COGNITION: Overall cognitive status: Within functional limits for tasks assessed                                  SENSATION: WFL   POSTURE: Rounded shoulders, fwd head, sling donned on R UE   UPPER EXTREMITY ROM:    Passive ROM Right eval Right 12/14  Shoulder flexion 45  140  Shoulder extension      Shoulder abduction 30  95  Shoulder adduction      Shoulder internal rotation 40  80  Shoulder external rotation 10  60  Elbow flexion      Elbow extension      Wrist flexion      Wrist extension      Wrist ulnar deviation      Wrist radial deviation      Wrist pronation      Wrist supination      (Blank rows = not tested)   UPPER EXTREMITY MMT:   MMT Right eval Left eval  Shoulder flexion      Shoulder extension      Shoulder abduction      Shoulder adduction      Shoulder  internal rotation      Shoulder external rotation      Middle trapezius      Lower trapezius      Elbow flexion      Elbow extension      Wrist flexion      Wrist extension      Wrist ulnar deviation      Wrist radial deviation      Wrist pronation      Wrist supination      Grip strength (lbs)      (Blank rows = not tested)   SHOULDER SPECIAL TESTS: Impingement tests: DNT SLAP lesions: DNT Instability tests: DNT Rotator cuff assessment: DNT Biceps assessment: DNT   JOINT MOBILITY TESTING:  DNT   PALPATION:  DNT             TREATMENT: [facility] Adult PT Treatment:                                                DATE: 02/06/2022 Therapeutic Exercise: UBE level 1 2/2 fwd and backward for warm up while taking subjective Shoulder flexion/scaption with pulleys x 1 min each Row 2x10 17# Shoulder extension 2x10 17# Wall walk x 5 R shoulder flexion Seated bilateral ER 2x10 RTB  Seated horizontal abd 2x10 RTB R shoulder IR/ER RTB 2x10 - 3" hold Supine shoulder flexion with ball 2x10 (minimally painful arc) S/L shoulder abd 2x10 R Manual Therapy: PROM R shoulder flexion Modalities: Location:  right shoulder Time:  10  minutes Pressure:  low Temperature:  34 degrees  [facility] Adult PT Treatment:                                                DATE: 01/29/2022 Therapeutic Exercise: UBE level 1 2/2 fwd and backward for warm up while taking subjective Shoulder flexion/scaption with pulleys x 1 min each Row 2x10 GTB R shoulder IR/ER isometric 2x10 - 3" hold Seated cane ER AAROM x 15 - 3" hold Supine shoulder flexion with ball 2x10 (minimally painful arc) Supine horizontal abd 2x10 YTB R shoulder ER/IR isometric against manual resistance 2x10 - 3" hold Manual Therapy: PROM ER/IR, flexion STM R pec major Modalities: Location:  right shoulder Time:  10 minutes Pressure:  low Temperature:  34 degrees  [facility] Adult PT Treatment:                                                DATE:  01/14/2022 Therapeutic Exercise: UBE level 1 2/2 fwd and backward for warm up while taking subjective Shoulder flexion with pulleys x 2 min Row 3x10 RTB Shoulder extension RTB 2x10 Supine shoulder flexion with dowel 2x10 (minimally painful arc) Supine chest press with dowel x 10 (pain) Supine horizontal abd 2x10 YTB (pain) R shoulder ER/IR isometric against manual resistance 2x10 - 3" hold Manual Therapy: PROM ER/IR, flexion STM R pec major Modalities: Location:  right shoulder Time:  10 minutes Pressure:  low Temperature:  34 degrees  [facility] Adult PT Treatment:                                                DATE: 01/09/2022 Therapeutic Exercise: UBE level 1 3/3 fwd and backward for warm up while taking subjective Supine chest press with dowel 2x10 Shoulder flexion with pulleys x 2 min Row 3x10 YTB Shoulder extension YTB 3x10 Supine shoulder flexion with dowel 2x10 (minimally painful arc) Supine chest press AROM Rt with tennis ball 2x10 Supine shoulder flexion AROM with tennis ball? x3  (too painful, only about 10 lift) Manual Therapy: PROM ER/IR, flexion STM R pec major Modalities: Location:  right shoulder Time:  10 minutes Pressure:  low Temperature:  34 degrees  [facility] Adult PT Treatment:                                                DATE: 01/07/2022 Therapeutic Exercise: UBE level 1 3/3 fwd and backward for warm up while taking subjective Supine chest press with dowel 2x10 Shoulder flexion with pulleys x 2 min Row 3x10 YTBs Seated table slide 2x10 R shoulder flexion/scaption Supine shoulder flexion with dowel 2x10 (minimally painful arc) Supine chest press AROM Rt with tennis ball x10 Manual Therapy: PROM ER/IR, flexion STM R pec major Modalities: Location:  right shoulder Time:  10 minutes Pressure:  low Temperature:  34 degrees  PATIENT EDUCATION: Education details: continue HEP Person educated: Patient Education method: Explanation, Demonstration, and  Handouts Education comprehension: verbalized  understanding and returned demonstration   HOME EXERCISE PROGRAM: Access Code: [email or web address] URL: [email or web address] Date: 01/29/2022 Prepared by: Octavio Manns  Exercises - Supine Shoulder Flexion Extension AAROM with Dowel  - 1 x daily - 7 x weekly - 2 sets - 10 reps - Seated Scapular Retraction  - 1 x daily - 7 x weekly - 2 sets - 10 reps - Standing Isometric Shoulder Internal Rotation at Doorway  - 1 x daily - 7 x weekly - 2 sets - 10 reps - 3 sec hold - Standing Isometric Shoulder External Rotation with Doorway  - 1 x daily - 7 x weekly - 2 sets - 10 reps - 3 sec hold   ASSESSMENT:   CLINICAL IMPRESSION: Pt able to complete prescribed exercises with no adverse effect or increase in pain. Therapy focused on improving R shoulder strength and ROM post op. She is progressing well with therapy but continues to have a good bit of post op pain. HEP updated for continued strength and ROM work at home. PT will continue to progress as able per POC.    OBJECTIVE IMPAIRMENTS: decreased activity tolerance, decreased ROM, decreased strength, impaired UE functional use, and pain.    ACTIVITY LIMITATIONS: carrying, lifting, continence, bathing, toileting, and reach over head   PARTICIPATION LIMITATIONS: driving, shopping, community activity, and yard work   PERSONAL FACTORS: Fitness and 3+ comorbidities: HTN, DM II, Depression  are also affecting patient's functional outcome.      GOALS: Goals reviewed with patient? No   SHORT TERM GOALS: Target date: 01/13/2022   Pt will be compliant and knowledgeable with initial HEP for improved comfort and carryover Baseline: initial HEP given  Goal status: HEP   2.  Pt will self report right shoulder pain no greater than 6/10 for improved comfort and functional ability Baseline: 10/10 at worst Goal status: HEP   LONG TERM GOALS: Target date: 03/17/2022   Pt will improve FOTO function score  to no less than 50% as proxy for functional improvement Baseline: 4% function Goal status: INITIAL    2.  Pt will self report right shoulder pain no greater than 3/10 for improved comfort and functional ability Baseline: 10/10 at worst Goal status: INITIAL    3.  Pt will improve R shoulder flex/abd AROM to no less than 140 in order to improve functional ability with ADLs Baseline: see chart Goal status: INITIAL   4.  Pt will be able to reach overhead into cabinets not limited by pain for improved ADL performance Baseline: unable Goal status: INITIAL   PLAN:   PT FREQUENCY: 2x/week   PT DURATION: 12 weeks   PLANNED INTERVENTIONS: Therapeutic exercises, Therapeutic activity, Neuromuscular re-education, Balance training, Gait training, Patient/Family education, Self Care, Joint mobilization, Electrical stimulation, Cryotherapy, Moist heat, Vasopneumatic device, Manual therapy, and Re-evaluation   PLAN FOR NEXT SESSION: assess HEP response, PROM to R shoulder, vaso R shoulder, strengthening and ROM R shoulder   Ward Chatters, PT 02/06/2022, 1:30 PM

## 2022-02-11 ENCOUNTER — Ambulatory Visit: Payer: 59

## 2022-02-11 DIAGNOSIS — M25511 Pain in right shoulder: Secondary | ICD-10-CM

## 2022-02-11 DIAGNOSIS — M6281 Muscle weakness (generalized): Secondary | ICD-10-CM

## 2022-02-11 NOTE — Therapy (Signed)
OUTPATIENT PHYSICAL THERAPY TREATMENT NOTE/PROGRESS NOTE  Progress Note Reporting Period 12/23/2021 to 02/11/2022  See note below for Objective Data and Assessment of Progress/Goals.      Patient Name: Tracy Daugherty MRN: [id number] DOB:05/02/1960, 62 y.o., female 49 Date: 02/11/2022  PCP: Tito Dine, PA-C  REFERRING PROVIDER: Earlie Server, MD   END OF SESSION:   PT End of Session - 02/11/22 1221     Visit Number 10    Number of Visits 24    Date for PT Re-Evaluation 03/17/22    Authorization Type UHC Medicare/Medicaid    PT Start Time 1221   arrived late   PT Stop Time 1300    PT Time Calculation (min) 39 min    Activity Tolerance Patient tolerated treatment well;Patient limited by pain    Behavior During Therapy WFL for tasks assessed/performed                    Past Medical History:  Diagnosis Date   Arthritis    knees, ankles   Bronchitis    uses inhaler prn   Depression    Diabetes mellitus without complication ([place])    type 2   GERD (gastroesophageal reflux disease)    Headache    controlled with meds   Hepatitis 2014   Hep C - was treated   HLD (hyperlipidemia)    Hypertension    Hyperthyroidism    RLL pneumonia    x 1   Seasonal allergies    Sleep apnea    uses cpap nightly   Wears glasses    Wears partial dentures    upper   Past Surgical History:  Procedure Laterality Date   ABDOMINAL HYSTERECTOMY     ANTERIOR (CYSTOCELE) AND POSTERIOR REPAIR (RECTOCELE) WITH XENFORM GRAFT AND SACROSPINOUS FIXATION  2013   ANTERIOR CERVICAL DECOMPRESSION/DISCECTOMY FUSION 4 LEVELS N/A 04/02/2020   Procedure: CERVICAL THREE-FOUR, CERVICAL FOUR-FIVE, CERVICAL FIVE-SIX, CERVICAL SIX-SEVEN ANTERIOR CERVICAL DECOMPRESSION/DISCECTOMY FUSION;  Surgeon: Kary Kos, MD;  Location: [facility];  Service: Neurosurgery;  Laterality: N/A;   CHOLECYSTECTOMY     COLONOSCOPY     right sided transverse abdominas plane block under ultrasound guidance  2018    TUBAL LIGATION     UPPER GI ENDOSCOPY     WISDOM TOOTH EXTRACTION  1980s   Patient Active Problem List   Diagnosis Date Noted   Spinal stenosis in cervical region 04/02/2020    REFERRING DIAG:  P/O RT SHLD SCOPE DISTAL CLAVICLECTOMY POSS OPEN ROTATOR CUFF REPAIR    THERAPY DIAG:  Acute pain of right shoulder  Muscle weakness (generalized)  Rationale for Evaluation and Treatment Rehabilitation  PERTINENT HISTORY: HTN, DM II, Depression   PRECAUTIONS: Shoulder  ONSET DATE: 12/18/2021   SUBJECTIVE:  SUBJECTIVE STATEMENT:  Pt presents to PT with continued reports of R shoulder pain. Has been compliant with HEP with no adverse effect. Pt is ready to begin PT at this time.    PAIN:  Are you having pain?  Yes: NPRS scale: 4/10 Worst: 10/10 Pain location: R anterior shoulder  Pain description: sharp, post surgical Aggravating factors: lying down Relieving factors: positioning    OBJECTIVE: (objective measures completed at initial evaluation unless otherwise dated)   DIAGNOSTIC FINDINGS:  See imaging    PATIENT SURVEYS:  FOTO: 42% function; 50% predicted    COGNITION: Overall cognitive status: Within functional limits for tasks assessed                                  SENSATION: WFL   POSTURE: Rounded shoulders, fwd head, sling donned on R UE   UPPER EXTREMITY ROM:    Passive ROM Right eval Right 12/14 Right 02/11/22 AROM  Shoulder flexion 45  140 112  Shoulder extension       Shoulder abduction 30  95 145  Shoulder adduction       Shoulder internal rotation 40  80   Shoulder external rotation 10  60   Elbow flexion       Elbow extension       Wrist flexion       Wrist extension       Wrist ulnar deviation       Wrist radial deviation       Wrist pronation       Wrist  supination       (Blank rows = not tested)   UPPER EXTREMITY MMT:   MMT Right eval Left eval  Shoulder flexion      Shoulder extension      Shoulder abduction      Shoulder adduction      Shoulder internal rotation      Shoulder external rotation      Middle trapezius      Lower trapezius      Elbow flexion      Elbow extension      Wrist flexion      Wrist extension      Wrist ulnar deviation      Wrist radial deviation      Wrist pronation      Wrist supination      Grip strength (lbs)      (Blank rows = not tested)   SHOULDER SPECIAL TESTS: Impingement tests: DNT SLAP lesions: DNT Instability tests: DNT Rotator cuff assessment: DNT Biceps assessment: DNT   JOINT MOBILITY TESTING:  DNT   PALPATION:  DNT             TREATMENT: [facility] Adult PT Treatment:                                                DATE: 02/11/2022 Therapeutic Exercise: UBE level 1 2/2 fwd and backward for warm up while taking subjective/ Row 2x10 17# Shoulder extension 2x10 17# Wall walk x 10 R shoulder flexion Seated bilateral ER 2x15 GTB  Supine horizontal abd 2x10 GTB R shoulder IR/ER RTB 2x10 Supine shoulder flexion with ball 2x10 S/L shoulder abd 2x10 R 2# Therapeutic Activity: Assessment of tests/measures, goals, and outcomes for  progress note  [facility] Adult PT Treatment:                                                DATE: 02/06/2022 Therapeutic Exercise: UBE level 1 2/2 fwd and backward for warm up while taking subjective Shoulder flexion/scaption with pulleys x 1 min each Row 2x10 17# Shoulder extension 2x10 17# Wall walk x 5 R shoulder flexion Seated bilateral ER 2x10 RTB  Seated horizontal abd 2x10 RTB R shoulder IR/ER RTB 2x10 - 3" hold Supine shoulder flexion with ball 2x10 (minimally painful arc) S/L shoulder abd 2x10 R Manual Therapy: PROM R shoulder flexion Modalities: Location:  right shoulder Time:  10 minutes Pressure:  low Temperature:  34 degrees  [facility] Adult  PT Treatment:                                                DATE: 01/29/2022 Therapeutic Exercise: UBE level 1 2/2 fwd and backward for warm up while taking subjective Shoulder flexion/scaption with pulleys x 1 min each Row 2x10 GTB R shoulder IR/ER isometric 2x10 - 3" hold Seated cane ER AAROM x 15 - 3" hold Supine shoulder flexion with ball 2x10 (minimally painful arc) Supine horizontal abd 2x10 YTB R shoulder ER/IR isometric against manual resistance 2x10 - 3" hold Manual Therapy: PROM ER/IR, flexion STM R pec major Modalities: Location:  right shoulder Time:  10 minutes Pressure:  low Temperature:  34 degrees  [facility] Adult PT Treatment:                                                DATE: 01/14/2022 Therapeutic Exercise: UBE level 1 2/2 fwd and backward for warm up while taking subjective Shoulder flexion with pulleys x 2 min Row 3x10 RTB Shoulder extension RTB 2x10 Supine shoulder flexion with dowel 2x10 (minimally painful arc) Supine chest press with dowel x 10 (pain) Supine horizontal abd 2x10 YTB (pain) R shoulder ER/IR isometric against manual resistance 2x10 - 3" hold Manual Therapy: PROM ER/IR, flexion STM R pec major Modalities: Location:  right shoulder Time:  10 minutes Pressure:  low Temperature:  34 degrees  [facility] Adult PT Treatment:                                                DATE: 01/09/2022 Therapeutic Exercise: UBE level 1 3/3 fwd and backward for warm up while taking subjective Supine chest press with dowel 2x10 Shoulder flexion with pulleys x 2 min Row 3x10 YTB Shoulder extension YTB 3x10 Supine shoulder flexion with dowel 2x10 (minimally painful arc) Supine chest press AROM Rt with tennis ball 2x10 Supine shoulder flexion AROM with tennis ball? x3  (too painful, only about 10 lift) Manual Therapy: PROM ER/IR, flexion STM R pec major Modalities: Location:  right shoulder Time:  10 minutes Pressure:  low Temperature:  34 degrees  PATIENT  EDUCATION: Education details: continue HEP Person educated: Patient Education method: Programmer, multimedia, Demonstration,  and Handouts Education comprehension: verbalized understanding and returned demonstration   HOME EXERCISE PROGRAM: Access Code: [email or web address] URL: [email or web address] Date: 01/29/2022 Prepared by: Edwinna Areola  Exercises - Supine Shoulder Flexion Extension AAROM with Dowel  - 1 x daily - 7 x weekly - 2 sets - 10 reps - Seated Scapular Retraction  - 1 x daily - 7 x weekly - 2 sets - 10 reps - Standing Isometric Shoulder Internal Rotation at Doorway  - 1 x daily - 7 x weekly - 2 sets - 10 reps - 3 sec hold - Standing Isometric Shoulder External Rotation with Doorway  - 1 x daily - 7 x weekly - 2 sets - 10 reps - 3 sec hold   ASSESSMENT:   CLINICAL IMPRESSION: Pt was able to complete all prescribed exercises with no adverse effect. Therapy focused once again on improving periscapular and RTC muscle strength in order to improve comfort and function. She continues to progress well with therapy, showing improvement in all LTG on today's progress note. Will continue to progress as able per POC.    OBJECTIVE IMPAIRMENTS: decreased activity tolerance, decreased ROM, decreased strength, impaired UE functional use, and pain.    ACTIVITY LIMITATIONS: carrying, lifting, continence, bathing, toileting, and reach over head   PARTICIPATION LIMITATIONS: driving, shopping, community activity, and yard work   PERSONAL FACTORS: Fitness and 3+ comorbidities: HTN, DM II, Depression  are also affecting patient's functional outcome.      GOALS: Goals reviewed with patient? No   SHORT TERM GOALS: Target date: 01/13/2022   Pt will be compliant and knowledgeable with initial HEP for improved comfort and carryover Baseline: initial HEP given  Goal status: MET   2.  Pt will self report right shoulder pain no greater than 6/10 for improved comfort and functional ability Baseline:  10/10 at worst Goal status: MET   LONG TERM GOALS: Target date: 03/17/2022   Pt will improve FOTO function score to no less than 50% as proxy for functional improvement Baseline: 4% function 02/11/2022: 42% function Goal status: ONGOING   2.  Pt will self report right shoulder pain no greater than 3/10 for improved comfort and functional ability Baseline: 10/10 at worst Goal status: ONGOING   3.  Pt will improve R shoulder flex/abd AROM to no less than 140 in order to improve functional ability with ADLs Baseline: see chart Goal status: ONGOING   4.  Pt will be able to reach overhead into cabinets not limited by pain for improved ADL performance Baseline: unable Goal status: ONGOING   PLAN:   PT FREQUENCY: 2x/week   PT DURATION: 12 weeks   PLANNED INTERVENTIONS: Therapeutic exercises, Therapeutic activity, Neuromuscular re-education, Balance training, Gait training, Patient/Family education, Self Care, Joint mobilization, Electrical stimulation, Cryotherapy, Moist heat, Vasopneumatic device, Manual therapy, and Re-evaluation   PLAN FOR NEXT SESSION: assess HEP response, PROM to R shoulder, vaso R shoulder, strengthening and ROM R shoulder   Eloy End, PT 02/11/2022, 1:49 PM

## 2022-02-13 ENCOUNTER — Ambulatory Visit: Payer: 59

## 2022-02-13 DIAGNOSIS — M25511 Pain in right shoulder: Secondary | ICD-10-CM | POA: Diagnosis not present

## 2022-02-13 DIAGNOSIS — M6281 Muscle weakness (generalized): Secondary | ICD-10-CM

## 2022-02-13 DIAGNOSIS — R600 Localized edema: Secondary | ICD-10-CM

## 2022-02-13 NOTE — Therapy (Signed)
OUTPATIENT PHYSICAL THERAPY TREATMENT NOTE     Patient Name: Tracy Daugherty MRN: [id number] DOB:07-31-1960, 62 y.o., female Today's Date: 02/13/2022  PCP: Kennith Maes, PA-C  REFERRING PROVIDER: Frederico Hamman, MD   END OF SESSION:   PT End of Session - 02/13/22 1212     Visit Number 11    Number of Visits 24    Date for PT Re-Evaluation 03/17/22    Authorization Type [employer] Medicare/Medicaid    PT Start Time 1215    PT Stop Time 1255    PT Time Calculation (min) 40 min    Activity Tolerance Patient tolerated treatment well;Patient limited by pain    Behavior During Therapy WFL for tasks assessed/performed                     Past Medical History:  Diagnosis Date   Arthritis    knees, ankles   Bronchitis    uses inhaler prn   Depression    Diabetes mellitus without complication (HCC)    type 2   GERD (gastroesophageal reflux disease)    Headache    controlled with meds   Hepatitis 2014   Hep C - was treated   HLD (hyperlipidemia)    Hypertension    Hyperthyroidism    RLL pneumonia    x 1   Seasonal allergies    Sleep apnea    uses cpap nightly   Wears glasses    Wears partial dentures    upper   Past Surgical History:  Procedure Laterality Date   ABDOMINAL HYSTERECTOMY     ANTERIOR (CYSTOCELE) AND POSTERIOR REPAIR (RECTOCELE) WITH XENFORM GRAFT AND SACROSPINOUS FIXATION  2013   ANTERIOR CERVICAL DECOMPRESSION/DISCECTOMY FUSION 4 LEVELS N/A 04/02/2020   Procedure: CERVICAL THREE-FOUR, CERVICAL FOUR-FIVE, CERVICAL FIVE-SIX, CERVICAL SIX-SEVEN ANTERIOR CERVICAL DECOMPRESSION/DISCECTOMY FUSION;  Surgeon: Donalee Citrin, MD;  Location: [facility];  Service: Neurosurgery;  Laterality: N/A;   CHOLECYSTECTOMY     COLONOSCOPY     right sided transverse abdominas plane block under ultrasound guidance  2018   TUBAL LIGATION     UPPER GI ENDOSCOPY     WISDOM TOOTH EXTRACTION  1980s   Patient Active Problem List   Diagnosis Date Noted   Spinal stenosis in  cervical region 04/02/2020    REFERRING DIAG:  P/O RT SHLD SCOPE DISTAL CLAVICLECTOMY POSS OPEN ROTATOR CUFF REPAIR    THERAPY DIAG:  Acute pain of right shoulder  Muscle weakness (generalized)  Localized edema  Rationale for Evaluation and Treatment Rehabilitation  PERTINENT HISTORY: HTN, DM II, Depression   PRECAUTIONS: Shoulder  ONSET DATE: 12/18/2021   SUBJECTIVE:  SUBJECTIVE STATEMENT:  Pt presents to PT with reports of bilateral shoulder pain today. Pt has been compliant with HEP with no adverse effect. Pt is ready to begin PT at this time.    PAIN:  Are you having pain?  Yes: NPRS scale: 4/10 Worst: 10/10 Pain location: R anterior shoulder  Pain description: sharp, post surgical Aggravating factors: lying down Relieving factors: positioning    OBJECTIVE: (objective measures completed at initial evaluation unless otherwise dated)   DIAGNOSTIC FINDINGS:  See imaging    PATIENT SURVEYS:  FOTO: 42% function; 50% predicted    COGNITION: Overall cognitive status: Within functional limits for tasks assessed                                  SENSATION: WFL   POSTURE: Rounded shoulders, fwd head, sling donned on R UE   UPPER EXTREMITY ROM:    Passive ROM Right eval Right 12/14 Right 02/11/22 AROM  Shoulder flexion 45  140 112  Shoulder extension       Shoulder abduction 30  95 145  Shoulder adduction       Shoulder internal rotation 40  80   Shoulder external rotation 10  60   Elbow flexion       Elbow extension       Wrist flexion       Wrist extension       Wrist ulnar deviation       Wrist radial deviation       Wrist pronation       Wrist supination       (Blank rows = not tested)   UPPER EXTREMITY MMT:   MMT Right eval Left eval  Shoulder flexion       Shoulder extension      Shoulder abduction      Shoulder adduction      Shoulder internal rotation      Shoulder external rotation      Middle trapezius      Lower trapezius      Elbow flexion      Elbow extension      Wrist flexion      Wrist extension      Wrist ulnar deviation      Wrist radial deviation      Wrist pronation      Wrist supination      Grip strength (lbs)      (Blank rows = not tested)   SHOULDER SPECIAL TESTS: Impingement tests: DNT SLAP lesions: DNT Instability tests: DNT Rotator cuff assessment: DNT Biceps assessment: DNT   JOINT MOBILITY TESTING:  DNT   PALPATION:  DNT             TREATMENT: [facility] Adult PT Treatment:                                                DATE: 02/13/2022 Therapeutic Exercise: UBE level 1 2/2 fwd and backward for warm up while taking subjective/ Row 2x10 17# Shoulder extension 2x10 17# Wall slide 2x10 R shoulder flexion Seated bilateral ER 2x15 GTB  Seated horizontal abd 2x10 GTB R shoulder IR/ER RTB 3x10 Supine shoulder flexion AROM 2x10 - R S/L shoulder abd 2x10 R 2# Modalities: Location:  right shoulder Time:  10  minutes Pressure:  low Temperature:  34 degrees  [facility] Adult PT Treatment:                                                DATE: 02/11/2022 Therapeutic Exercise: UBE level 1 2/2 fwd and backward for warm up while taking subjective/ Row 2x10 17# Shoulder extension 2x10 17# Wall slide 2x10 R shoulder flexion Seated bilateral ER 2x15 GTB  Supine horizontal abd 2x10 GTB R shoulder IR/ER RTB 2x10 Supine shoulder flexion with ball 2x10 S/L shoulder abd 2x10 R 2# Therapeutic Activity: Assessment of tests/measures, goals, and outcomes for progress note  [facility] Adult PT Treatment:                                                DATE: 02/06/2022 Therapeutic Exercise: UBE level 1 2/2 fwd and backward for warm up while taking subjective Shoulder flexion/scaption with pulleys x 1 min each Row 2x10 17# Shoulder  extension 2x10 17# Wall walk x 5 R shoulder flexion Seated bilateral ER 2x10 RTB  Seated horizontal abd 2x10 RTB R shoulder IR/ER RTB 2x10 - 3" hold Supine shoulder flexion with ball 2x10 (minimally painful arc) S/L shoulder abd 2x10 R Manual Therapy: PROM R shoulder flexion Modalities: Location:  right shoulder Time:  10 minutes Pressure:  low Temperature:  34 degrees  [facility] Adult PT Treatment:                                                DATE: 01/29/2022 Therapeutic Exercise: UBE level 1 2/2 fwd and backward for warm up while taking subjective Shoulder flexion/scaption with pulleys x 1 min each Row 2x10 GTB R shoulder IR/ER isometric 2x10 - 3" hold Seated cane ER AAROM x 15 - 3" hold Supine shoulder flexion with ball 2x10 (minimally painful arc) Supine horizontal abd 2x10 YTB R shoulder ER/IR isometric against manual resistance 2x10 - 3" hold Manual Therapy: PROM ER/IR, flexion STM R pec major Modalities: Location:  right shoulder Time:  10 minutes Pressure:  low Temperature:  34 degrees  [facility] Adult PT Treatment:                                                DATE: 01/14/2022 Therapeutic Exercise: UBE level 1 2/2 fwd and backward for warm up while taking subjective Shoulder flexion with pulleys x 2 min Row 3x10 RTB Shoulder extension RTB 2x10 Supine shoulder flexion with dowel 2x10 (minimally painful arc) Supine chest press with dowel x 10 (pain) Supine horizontal abd 2x10 YTB (pain) R shoulder ER/IR isometric against manual resistance 2x10 - 3" hold Manual Therapy: PROM ER/IR, flexion STM R pec major Modalities: Location:  right shoulder Time:  10 minutes Pressure:  low Temperature:  34 degrees  [facility] Adult PT Treatment:  DATE: 01/09/2022 Therapeutic Exercise: UBE level 1 3/3 fwd and backward for warm up while taking subjective Supine chest press with dowel 2x10 Shoulder flexion with pulleys x 2 min Row 3x10  YTB Shoulder extension YTB 3x10 Supine shoulder flexion with dowel 2x10 (minimally painful arc) Supine chest press AROM Rt with tennis ball 2x10 Supine shoulder flexion AROM with tennis ball? x3  (too painful, only about 10 lift) Manual Therapy: PROM ER/IR, flexion STM R pec major Modalities: Location:  right shoulder Time:  10 minutes Pressure:  low Temperature:  34 degrees  PATIENT EDUCATION: Education details: continue HEP Person educated: Patient Education method: Explanation, Demonstration, and Handouts Education comprehension: verbalized understanding and returned demonstration   HOME EXERCISE PROGRAM: Access Code: [email or web address] URL: [email or web address] Date: 02/13/2022 Prepared by: Edwinna Areola  Exercises - Supine Shoulder Flexion Extension AAROM with Dowel  - 1 x daily - 7 x weekly - 2 sets - 10 reps - Standing Shoulder Row with Anchored Resistance  - 1 x daily - 7 x weekly - 3 sets - 10 reps - blue band hold - Seated Scapular Retraction  - 1 x daily - 7 x weekly - 2 sets - 10 reps - Standing Isometric Shoulder Internal Rotation at Doorway  - 1 x daily - 7 x weekly - 2 sets - 10 reps - 3 sec hold - Standing Isometric Shoulder External Rotation with Doorway  - 1 x daily - 7 x weekly - 2 sets - 10 reps - 3 sec hold - Shoulder External Rotation with Anchored Resistance  - 1 x daily - 7 x weekly - 2 sets - 10 reps - red band hold - Shoulder Internal Rotation with Resistance  - 1 x daily - 7 x weekly - 2 sets - 10 reps - red band hold   ASSESSMENT:   CLINICAL IMPRESSION: Pt able to complete prescribed exercises with no adverse effect or increase in pain. Therapy focused on improving R shoulder strength and ROM post op. She is progressing well with therapy in last few weeks with improving ROM. HEP updated for continued strength and ROM work at home. PT will continue to progress as able per POC.    OBJECTIVE IMPAIRMENTS: decreased activity tolerance, decreased  ROM, decreased strength, impaired UE functional use, and pain.    ACTIVITY LIMITATIONS: carrying, lifting, continence, bathing, toileting, and reach over head   PARTICIPATION LIMITATIONS: driving, shopping, community activity, and yard work   PERSONAL FACTORS: Fitness and 3+ comorbidities: HTN, DM II, Depression  are also affecting patient's functional outcome.      GOALS: Goals reviewed with patient? No   SHORT TERM GOALS: Target date: 01/13/2022   Pt will be compliant and knowledgeable with initial HEP for improved comfort and carryover Baseline: initial HEP given  Goal status: MET   2.  Pt will self report right shoulder pain no greater than 6/10 for improved comfort and functional ability Baseline: 10/10 at worst Goal status: MET   LONG TERM GOALS: Target date: 03/17/2022   Pt will improve FOTO function score to no less than 50% as proxy for functional improvement Baseline: 4% function 02/11/2022: 42% function Goal status: ONGOING   2.  Pt will self report right shoulder pain no greater than 3/10 for improved comfort and functional ability Baseline: 10/10 at worst Goal status: ONGOING   3.  Pt will improve R shoulder flex/abd AROM to no less than 140 in order to improve functional ability with ADLs Baseline: see chart Goal status:  ONGOING   4.  Pt will be able to reach overhead into cabinets not limited by pain for improved ADL performance Baseline: unable Goal status: ONGOING   PLAN:   PT FREQUENCY: 2x/week   PT DURATION: 12 weeks   PLANNED INTERVENTIONS: Therapeutic exercises, Therapeutic activity, Neuromuscular re-education, Balance training, Gait training, Patient/Family education, Self Care, Joint mobilization, Electrical stimulation, Cryotherapy, Moist heat, Vasopneumatic device, Manual therapy, and Re-evaluation   PLAN FOR NEXT SESSION: assess HEP response, PROM to R shoulder, vaso R shoulder, strengthening and ROM R shoulder   Eloy End,  PT 02/13/2022, 1:46 PM

## 2022-02-17 ENCOUNTER — Ambulatory Visit: Payer: 59

## 2022-02-17 NOTE — Therapy (Incomplete)
OUTPATIENT PHYSICAL THERAPY TREATMENT NOTE     Patient Name: Tracy Daugherty MRN: [id number] DOB:08/15/1960, 62 y.o., female Today's Date: 02/17/2022  PCP: Kennith Maes, PA-C  REFERRING PROVIDER: Frederico Hamman, MD   END OF SESSION:             Past Medical History:  Diagnosis Date   Arthritis    knees, ankles   Bronchitis    uses inhaler prn   Depression    Diabetes mellitus without complication (HCC)    type 2   GERD (gastroesophageal reflux disease)    Headache    controlled with meds   Hepatitis 2014   Hep C - was treated   HLD (hyperlipidemia)    Hypertension    Hyperthyroidism    RLL pneumonia    x 1   Seasonal allergies    Sleep apnea    uses cpap nightly   Wears glasses    Wears partial dentures    upper   Past Surgical History:  Procedure Laterality Date   ABDOMINAL HYSTERECTOMY     ANTERIOR (CYSTOCELE) AND POSTERIOR REPAIR (RECTOCELE) WITH XENFORM GRAFT AND SACROSPINOUS FIXATION  2013   ANTERIOR CERVICAL DECOMPRESSION/DISCECTOMY FUSION 4 LEVELS N/A 04/02/2020   Procedure: CERVICAL THREE-FOUR, CERVICAL FOUR-FIVE, CERVICAL FIVE-SIX, CERVICAL SIX-SEVEN ANTERIOR CERVICAL DECOMPRESSION/DISCECTOMY FUSION;  Surgeon: Donalee Citrin, MD;  Location: [facility] OR;  Service: Neurosurgery;  Laterality: N/A;   CHOLECYSTECTOMY     COLONOSCOPY     right sided transverse abdominas plane block under ultrasound guidance  2018   TUBAL LIGATION     UPPER GI ENDOSCOPY     WISDOM TOOTH EXTRACTION  1980s   Patient Active Problem List   Diagnosis Date Noted   Spinal stenosis in cervical region 04/02/2020    REFERRING DIAG:  P/O RT SHLD SCOPE DISTAL CLAVICLECTOMY POSS OPEN ROTATOR CUFF REPAIR    THERAPY DIAG:  No diagnosis found.  Rationale for Evaluation and Treatment Rehabilitation  PERTINENT HISTORY: HTN, DM II, Depression   PRECAUTIONS: Shoulder  ONSET DATE: 12/18/2021   SUBJECTIVE:                                                                                                                                                                                       SUBJECTIVE STATEMENT: ***   PAIN:  Are you having pain?  Yes: NPRS scale: 4/10 Worst: 10/10 Pain location: R anterior shoulder  Pain description: sharp, post surgical Aggravating factors: lying down Relieving factors: positioning    OBJECTIVE: (objective measures completed at initial evaluation unless otherwise dated)   DIAGNOSTIC FINDINGS:  See imaging    PATIENT SURVEYS:  FOTO: 42% function; 50%  predicted    COGNITION: Overall cognitive status: Within functional limits for tasks assessed                                  SENSATION: WFL   POSTURE: Rounded shoulders, fwd head, sling donned on R UE   UPPER EXTREMITY ROM:    Passive ROM Right eval Right 12/14 Right 02/11/22 AROM  Shoulder flexion 45  140 112  Shoulder extension       Shoulder abduction 30  95 145  Shoulder adduction       Shoulder internal rotation 40  80   Shoulder external rotation 10  60   Elbow flexion       Elbow extension       Wrist flexion       Wrist extension       Wrist ulnar deviation       Wrist radial deviation       Wrist pronation       Wrist supination       (Blank rows = not tested)   UPPER EXTREMITY MMT:   MMT Right eval Left eval  Shoulder flexion      Shoulder extension      Shoulder abduction      Shoulder adduction      Shoulder internal rotation      Shoulder external rotation      Middle trapezius      Lower trapezius      Elbow flexion      Elbow extension      Wrist flexion      Wrist extension      Wrist ulnar deviation      Wrist radial deviation      Wrist pronation      Wrist supination      Grip strength (lbs)      (Blank rows = not tested)   SHOULDER SPECIAL TESTS: Impingement tests: DNT SLAP lesions: DNT Instability tests: DNT Rotator cuff assessment: DNT Biceps assessment: DNT   JOINT MOBILITY TESTING:  DNT   PALPATION:  DNT              TREATMENT: [facility] Adult PT Treatment:                                                DATE: 02/17/2022 Therapeutic Exercise: UBE level 1 2/2 fwd and backward for warm up while taking subjective/ Row 2x10 17# Shoulder extension 2x10 17# Wall slide 2x10 R shoulder flexion Seated bilateral ER 2x15 GTB  Seated horizontal abd 2x10 GTB R shoulder IR/ER RTB 3x10 Supine shoulder flexion AROM 2x10 - R S/L shoulder abd 2x10 R 2# Modalities: Location:  right shoulder Time:  10 minutes Pressure:  low Temperature:  34 degrees  [facility] Adult PT Treatment:                                                DATE: 02/13/2022 Therapeutic Exercise: UBE level 1 2/2 fwd and backward for warm up while taking subjective/ Row 2x10 17# Shoulder extension 2x10 17# Wall slide 2x10 R shoulder flexion Seated bilateral ER 2x15 GTB  Seated  horizontal abd 2x10 GTB R shoulder IR/ER RTB 3x10 Supine shoulder flexion AROM 2x10 - R S/L shoulder abd 2x10 R 2# Modalities: Location:  right shoulder Time:  10 minutes Pressure:  low Temperature:  34 degrees  [facility] Adult PT Treatment:                                                DATE: 02/11/2022 Therapeutic Exercise: UBE level 1 2/2 fwd and backward for warm up while taking subjective/ Row 2x10 17# Shoulder extension 2x10 17# Wall slide 2x10 R shoulder flexion Seated bilateral ER 2x15 GTB  Supine horizontal abd 2x10 GTB R shoulder IR/ER RTB 2x10 Supine shoulder flexion with ball 2x10 S/L shoulder abd 2x10 R 2# Therapeutic Activity: Assessment of tests/measures, goals, and outcomes for progress note  [facility] Adult PT Treatment:                                                DATE: 02/06/2022 Therapeutic Exercise: UBE level 1 2/2 fwd and backward for warm up while taking subjective Shoulder flexion/scaption with pulleys x 1 min each Row 2x10 17# Shoulder extension 2x10 17# Wall walk x 5 R shoulder flexion Seated bilateral ER 2x10 RTB  Seated horizontal abd  2x10 RTB R shoulder IR/ER RTB 2x10 - 3" hold Supine shoulder flexion with ball 2x10 (minimally painful arc) S/L shoulder abd 2x10 R Manual Therapy: PROM R shoulder flexion Modalities: Location:  right shoulder Time:  10 minutes Pressure:  low Temperature:  34 degrees  PATIENT EDUCATION: Education details: continue HEP Person educated: Patient Education method: Explanation, Demonstration, and Handouts Education comprehension: verbalized understanding and returned demonstration   HOME EXERCISE PROGRAM: Access Code: [email or web address] URL: [email or web address] Date: 02/13/2022 Prepared by: Octavio Manns  Exercises - Supine Shoulder Flexion Extension AAROM with Dowel  - 1 x daily - 7 x weekly - 2 sets - 10 reps - Standing Shoulder Row with Anchored Resistance  - 1 x daily - 7 x weekly - 3 sets - 10 reps - blue band hold - Seated Scapular Retraction  - 1 x daily - 7 x weekly - 2 sets - 10 reps - Standing Isometric Shoulder Internal Rotation at Doorway  - 1 x daily - 7 x weekly - 2 sets - 10 reps - 3 sec hold - Standing Isometric Shoulder External Rotation with Doorway  - 1 x daily - 7 x weekly - 2 sets - 10 reps - 3 sec hold - Shoulder External Rotation with Anchored Resistance  - 1 x daily - 7 x weekly - 2 sets - 10 reps - red band hold - Shoulder Internal Rotation with Resistance  - 1 x daily - 7 x weekly - 2 sets - 10 reps - red band hold   ASSESSMENT:   CLINICAL IMPRESSION: ***  OBJECTIVE IMPAIRMENTS: decreased activity tolerance, decreased ROM, decreased strength, impaired UE functional use, and pain.    ACTIVITY LIMITATIONS: carrying, lifting, continence, bathing, toileting, and reach over head   PARTICIPATION LIMITATIONS: driving, shopping, community activity, and yard work   PERSONAL FACTORS: Fitness and 3+ comorbidities: HTN, DM II, Depression  are also affecting patient's functional outcome.      GOALS: Goals reviewed with patient? No  SHORT TERM GOALS:  Target date: 01/13/2022   Pt will be compliant and knowledgeable with initial HEP for improved comfort and carryover Baseline: initial HEP given  Goal status: MET   2.  Pt will self report right shoulder pain no greater than 6/10 for improved comfort and functional ability Baseline: 10/10 at worst Goal status: MET   LONG TERM GOALS: Target date: 03/17/2022   Pt will improve FOTO function score to no less than 50% as proxy for functional improvement Baseline: 4% function 02/11/2022: 42% function Goal status: ONGOING   2.  Pt will self report right shoulder pain no greater than 3/10 for improved comfort and functional ability Baseline: 10/10 at worst Goal status: ONGOING   3.  Pt will improve R shoulder flex/abd AROM to no less than 140 in order to improve functional ability with ADLs Baseline: see chart Goal status: ONGOING   4.  Pt will be able to reach overhead into cabinets not limited by pain for improved ADL performance Baseline: unable Goal status: ONGOING   PLAN:   PT FREQUENCY: 2x/week   PT DURATION: 12 weeks   PLANNED INTERVENTIONS: Therapeutic exercises, Therapeutic activity, Neuromuscular re-education, Balance training, Gait training, Patient/Family education, Self Care, Joint mobilization, Electrical stimulation, Cryotherapy, Moist heat, Vasopneumatic device, Manual therapy, and Re-evaluation   PLAN FOR NEXT SESSION: assess HEP response, PROM to R shoulder, vaso R shoulder, strengthening and ROM R shoulder   Ward Chatters, PT 02/17/2022, 7:43 AM

## 2022-02-19 ENCOUNTER — Ambulatory Visit: Payer: 59

## 2022-02-19 NOTE — Therapy (Incomplete)
OUTPATIENT PHYSICAL THERAPY TREATMENT NOTE     Patient Name: Tracy Daugherty MRN: [id number] DOB:02-15-1960, 62 y.o., female Today's Date: 02/19/2022  PCP: Tito Dine, PA-C  REFERRING PROVIDER: Earlie Server, MD   END OF SESSION:             Past Medical History:  Diagnosis Date   Arthritis    knees, ankles   Bronchitis    uses inhaler prn   Depression    Diabetes mellitus without complication ([place])    type 2   GERD (gastroesophageal reflux disease)    Headache    controlled with meds   Hepatitis 2014   Hep C - was treated   HLD (hyperlipidemia)    Hypertension    Hyperthyroidism    RLL pneumonia    x 1   Seasonal allergies    Sleep apnea    uses cpap nightly   Wears glasses    Wears partial dentures    upper   Past Surgical History:  Procedure Laterality Date   ABDOMINAL HYSTERECTOMY     ANTERIOR (CYSTOCELE) AND POSTERIOR REPAIR (RECTOCELE) WITH XENFORM GRAFT AND SACROSPINOUS FIXATION  2013   ANTERIOR CERVICAL DECOMPRESSION/DISCECTOMY FUSION 4 LEVELS N/A 04/02/2020   Procedure: CERVICAL THREE-FOUR, CERVICAL FOUR-FIVE, CERVICAL FIVE-SIX, CERVICAL SIX-SEVEN ANTERIOR CERVICAL DECOMPRESSION/DISCECTOMY FUSION;  Surgeon: Kary Kos, MD;  Location: [facility];  Service: Neurosurgery;  Laterality: N/A;   CHOLECYSTECTOMY     COLONOSCOPY     right sided transverse abdominas plane block under ultrasound guidance  2018   TUBAL LIGATION     UPPER GI ENDOSCOPY     WISDOM TOOTH EXTRACTION  1980s   Patient Active Problem List   Diagnosis Date Noted   Spinal stenosis in cervical region 04/02/2020    REFERRING DIAG:  P/O RT SHLD SCOPE DISTAL CLAVICLECTOMY POSS OPEN ROTATOR CUFF REPAIR    THERAPY DIAG:  No diagnosis found.  Rationale for Evaluation and Treatment Rehabilitation  PERTINENT HISTORY: HTN, DM II, Depression   PRECAUTIONS: Shoulder  ONSET DATE: 12/18/2021   SUBJECTIVE:                                                                                                                                                                                       SUBJECTIVE STATEMENT: ***   PAIN:  Are you having pain?  Yes: NPRS scale: 4/10 Worst: 10/10 Pain location: R anterior shoulder  Pain description: sharp, post surgical Aggravating factors: lying down Relieving factors: positioning    OBJECTIVE: (objective measures completed at initial evaluation unless otherwise dated)   DIAGNOSTIC FINDINGS:  See imaging    PATIENT SURVEYS:  FOTO: 42% function; 50%  predicted    COGNITION: Overall cognitive status: Within functional limits for tasks assessed                                  SENSATION: WFL   POSTURE: Rounded shoulders, fwd head, sling donned on R UE   UPPER EXTREMITY ROM:    Passive ROM Right eval Right 12/14 Right 02/11/22 AROM  Shoulder flexion 45  140 112  Shoulder extension       Shoulder abduction 30  95 145  Shoulder adduction       Shoulder internal rotation 40  80   Shoulder external rotation 10  60   Elbow flexion       Elbow extension       Wrist flexion       Wrist extension       Wrist ulnar deviation       Wrist radial deviation       Wrist pronation       Wrist supination       (Blank rows = not tested)   UPPER EXTREMITY MMT:   MMT Right eval Left eval  Shoulder flexion      Shoulder extension      Shoulder abduction      Shoulder adduction      Shoulder internal rotation      Shoulder external rotation      Middle trapezius      Lower trapezius      Elbow flexion      Elbow extension      Wrist flexion      Wrist extension      Wrist ulnar deviation      Wrist radial deviation      Wrist pronation      Wrist supination      Grip strength (lbs)      (Blank rows = not tested)   SHOULDER SPECIAL TESTS: Impingement tests: DNT SLAP lesions: DNT Instability tests: DNT Rotator cuff assessment: DNT Biceps assessment: DNT   JOINT MOBILITY TESTING:  DNT   PALPATION:  DNT              TREATMENT: [facility] Adult PT Treatment:                                                DATE: 02/17/2022 Therapeutic Exercise: UBE level 1 2/2 fwd and backward for warm up while taking subjective/ Row 2x10 17# Shoulder extension 2x10 17# Wall slide 2x10 R shoulder flexion Seated bilateral ER 2x15 GTB  Seated horizontal abd 2x10 GTB R shoulder IR/ER RTB 3x10 Supine shoulder flexion AROM 2x10 - R S/L shoulder abd 2x10 R 2# Modalities: Location:  right shoulder Time:  10 minutes Pressure:  low Temperature:  34 degrees  [facility] Adult PT Treatment:                                                DATE: 02/13/2022 Therapeutic Exercise: UBE level 1 2/2 fwd and backward for warm up while taking subjective/ Row 2x10 17# Shoulder extension 2x10 17# Wall slide 2x10 R shoulder flexion Seated bilateral ER 2x15 GTB  Seated  horizontal abd 2x10 GTB R shoulder IR/ER RTB 3x10 Supine shoulder flexion AROM 2x10 - R S/L shoulder abd 2x10 R 2# Modalities: Location:  right shoulder Time:  10 minutes Pressure:  low Temperature:  34 degrees  [facility] Adult PT Treatment:                                                DATE: 02/11/2022 Therapeutic Exercise: UBE level 1 2/2 fwd and backward for warm up while taking subjective/ Row 2x10 17# Shoulder extension 2x10 17# Wall slide 2x10 R shoulder flexion Seated bilateral ER 2x15 GTB  Supine horizontal abd 2x10 GTB R shoulder IR/ER RTB 2x10 Supine shoulder flexion with ball 2x10 S/L shoulder abd 2x10 R 2# Therapeutic Activity: Assessment of tests/measures, goals, and outcomes for progress note  [facility] Adult PT Treatment:                                                DATE: 02/06/2022 Therapeutic Exercise: UBE level 1 2/2 fwd and backward for warm up while taking subjective Shoulder flexion/scaption with pulleys x 1 min each Row 2x10 17# Shoulder extension 2x10 17# Wall walk x 5 R shoulder flexion Seated bilateral ER 2x10 RTB  Seated horizontal abd  2x10 RTB R shoulder IR/ER RTB 2x10 - 3" hold Supine shoulder flexion with ball 2x10 (minimally painful arc) S/L shoulder abd 2x10 R Manual Therapy: PROM R shoulder flexion Modalities: Location:  right shoulder Time:  10 minutes Pressure:  low Temperature:  34 degrees  PATIENT EDUCATION: Education details: continue HEP Person educated: Patient Education method: Explanation, Demonstration, and Handouts Education comprehension: verbalized understanding and returned demonstration   HOME EXERCISE PROGRAM: Access Code: [email or web address] URL: [email or web address] Date: 02/13/2022 Prepared by: Octavio Manns  Exercises - Supine Shoulder Flexion Extension AAROM with Dowel  - 1 x daily - 7 x weekly - 2 sets - 10 reps - Standing Shoulder Row with Anchored Resistance  - 1 x daily - 7 x weekly - 3 sets - 10 reps - blue band hold - Seated Scapular Retraction  - 1 x daily - 7 x weekly - 2 sets - 10 reps - Standing Isometric Shoulder Internal Rotation at Doorway  - 1 x daily - 7 x weekly - 2 sets - 10 reps - 3 sec hold - Standing Isometric Shoulder External Rotation with Doorway  - 1 x daily - 7 x weekly - 2 sets - 10 reps - 3 sec hold - Shoulder External Rotation with Anchored Resistance  - 1 x daily - 7 x weekly - 2 sets - 10 reps - red band hold - Shoulder Internal Rotation with Resistance  - 1 x daily - 7 x weekly - 2 sets - 10 reps - red band hold   ASSESSMENT:   CLINICAL IMPRESSION: ***  OBJECTIVE IMPAIRMENTS: decreased activity tolerance, decreased ROM, decreased strength, impaired UE functional use, and pain.    ACTIVITY LIMITATIONS: carrying, lifting, continence, bathing, toileting, and reach over head   PARTICIPATION LIMITATIONS: driving, shopping, community activity, and yard work   PERSONAL FACTORS: Fitness and 3+ comorbidities: HTN, DM II, Depression  are also affecting patient's functional outcome.      GOALS: Goals reviewed with patient? No  SHORT TERM GOALS:  Target date: 01/13/2022   Pt will be compliant and knowledgeable with initial HEP for improved comfort and carryover Baseline: initial HEP given  Goal status: MET   2.  Pt will self report right shoulder pain no greater than 6/10 for improved comfort and functional ability Baseline: 10/10 at worst Goal status: MET   LONG TERM GOALS: Target date: 03/17/2022   Pt will improve FOTO function score to no less than 50% as proxy for functional improvement Baseline: 4% function 02/11/2022: 42% function Goal status: ONGOING   2.  Pt will self report right shoulder pain no greater than 3/10 for improved comfort and functional ability Baseline: 10/10 at worst Goal status: ONGOING   3.  Pt will improve R shoulder flex/abd AROM to no less than 140 in order to improve functional ability with ADLs Baseline: see chart Goal status: ONGOING   4.  Pt will be able to reach overhead into cabinets not limited by pain for improved ADL performance Baseline: unable Goal status: ONGOING   PLAN:   PT FREQUENCY: 2x/week   PT DURATION: 12 weeks   PLANNED INTERVENTIONS: Therapeutic exercises, Therapeutic activity, Neuromuscular re-education, Balance training, Gait training, Patient/Family education, Self Care, Joint mobilization, Electrical stimulation, Cryotherapy, Moist heat, Vasopneumatic device, Manual therapy, and Re-evaluation   PLAN FOR NEXT SESSION: assess HEP response, PROM to R shoulder, vaso R shoulder, strengthening and ROM R shoulder   Ward Chatters, PT 02/19/2022, 9:08 AM

## 2022-02-24 ENCOUNTER — Ambulatory Visit: Payer: 59 | Attending: Orthopedic Surgery

## 2022-02-24 DIAGNOSIS — R600 Localized edema: Secondary | ICD-10-CM | POA: Insufficient documentation

## 2022-02-24 DIAGNOSIS — M25511 Pain in right shoulder: Secondary | ICD-10-CM | POA: Insufficient documentation

## 2022-02-24 DIAGNOSIS — M6281 Muscle weakness (generalized): Secondary | ICD-10-CM | POA: Insufficient documentation

## 2022-02-24 NOTE — Therapy (Signed)
OUTPATIENT PHYSICAL THERAPY TREATMENT NOTE     Patient Name: Tracy Daugherty MRN: [id number] DOB:10-04-1960, 62 y.o., female Today's Date: 02/24/2022  PCP: Kennith Maes, PA-C  REFERRING PROVIDER: Frederico Hamman, MD   END OF SESSION:   PT End of Session - 02/24/22 1359     Visit Number 12    Number of Visits 24    Date for PT Re-Evaluation 03/17/22    Authorization Type [employer] Medicare/Medicaid    PT Start Time 1400    PT Stop Time 1439    PT Time Calculation (min) 39 min    Activity Tolerance Patient tolerated treatment well;Patient limited by pain    Behavior During Therapy WFL for tasks assessed/performed                      Past Medical History:  Diagnosis Date   Arthritis    knees, ankles   Bronchitis    uses inhaler prn   Depression    Diabetes mellitus without complication ([facility])    type 2   GERD (gastroesophageal reflux disease)    Headache    controlled with meds   Hepatitis 2014   Hep C - was treated   HLD (hyperlipidemia)    Hypertension    Hyperthyroidism    RLL pneumonia    x 1   Seasonal allergies    Sleep apnea    uses cpap nightly   Wears glasses    Wears partial dentures    upper   Past Surgical History:  Procedure Laterality Date   ABDOMINAL HYSTERECTOMY     ANTERIOR (CYSTOCELE) AND POSTERIOR REPAIR (RECTOCELE) WITH XENFORM GRAFT AND SACROSPINOUS FIXATION  2013   ANTERIOR CERVICAL DECOMPRESSION/DISCECTOMY FUSION 4 LEVELS N/A 04/02/2020   Procedure: CERVICAL THREE-FOUR, CERVICAL FOUR-FIVE, CERVICAL FIVE-SIX, CERVICAL SIX-SEVEN ANTERIOR CERVICAL DECOMPRESSION/DISCECTOMY FUSION;  Surgeon: Donalee Citrin, MD;  Location: [facility];  Service: Neurosurgery;  Laterality: N/A;   CHOLECYSTECTOMY     COLONOSCOPY     right sided transverse abdominas plane block under ultrasound guidance  2018   TUBAL LIGATION     UPPER GI ENDOSCOPY     WISDOM TOOTH EXTRACTION  1980s   Patient Active Problem List   Diagnosis Date Noted   Spinal stenosis in  cervical region 04/02/2020    REFERRING DIAG:  P/O RT SHLD SCOPE DISTAL CLAVICLECTOMY POSS OPEN ROTATOR CUFF REPAIR    THERAPY DIAG:  Acute pain of right shoulder  Muscle weakness (generalized)  Localized edema  Rationale for Evaluation and Treatment Rehabilitation  PERTINENT HISTORY: HTN, DM II, Depression   PRECAUTIONS: Shoulder  ONSET DATE: 12/18/2021   SUBJECTIVE:  SUBJECTIVE STATEMENT: Pt presents to PT with reports of bilateral shoulder pain, still R>L. Has tried being compliant with HEP despite bilateral shoulder pain.    PAIN:  Are you having pain?  Yes: NPRS scale: 7/10 Worst: 10/10 Pain location: R anterior shoulder  Pain description: sharp, post surgical Aggravating factors: lying down Relieving factors: positioning    OBJECTIVE: (objective measures completed at initial evaluation unless otherwise dated)   DIAGNOSTIC FINDINGS:  See imaging    PATIENT SURVEYS:  FOTO: 42% function; 50% predicted    COGNITION: Overall cognitive status: Within functional limits for tasks assessed                                  SENSATION: WFL   POSTURE: Rounded shoulders, fwd head, sling donned on R UE   UPPER EXTREMITY ROM:    Passive ROM Right eval Right 12/14 Right 02/11/22 AROM  Shoulder flexion 45  140 112  Shoulder extension       Shoulder abduction 30  95 145  Shoulder adduction       Shoulder internal rotation 40  80   Shoulder external rotation 10  60   Elbow flexion       Elbow extension       Wrist flexion       Wrist extension       Wrist ulnar deviation       Wrist radial deviation       Wrist pronation       Wrist supination       (Blank rows = not tested)   UPPER EXTREMITY MMT:   MMT Right eval Left eval  Shoulder flexion      Shoulder extension       Shoulder abduction      Shoulder adduction      Shoulder internal rotation      Shoulder external rotation      Middle trapezius      Lower trapezius      Elbow flexion      Elbow extension      Wrist flexion      Wrist extension      Wrist ulnar deviation      Wrist radial deviation      Wrist pronation      Wrist supination      Grip strength (lbs)      (Blank rows = not tested)   SHOULDER SPECIAL TESTS: Impingement tests: DNT SLAP lesions: DNT Instability tests: DNT Rotator cuff assessment: DNT Biceps assessment: DNT   JOINT MOBILITY TESTING:  DNT   PALPATION:  DNT             TREATMENT: [facility] Adult PT Treatment:                                                DATE: 02/24/2022 Therapeutic Exercise: UBE level 1 2/2 fwd and backward for warm up while taking subjective Pulley's bilateral flexion x 2 min Row 2x10 17# Shoulder extension 2x10 17# Wall slide 2x10 R shoulder flexion Shoulder IR/ER RTB 2x10 each Supine horizontal abd 3x10 GTB Supine shoulder flexion with pball 2x15 S/L shoulder abd 2x10 R 2# S/L shoulder ER 2x10 2# Seated bilateral ER 2x15 GTB Modalities: Location:  right shoulder Time:  10  minutes Pressure:  low Temperature:  34 degrees  [facility] Adult PT Treatment:                                                DATE: 02/17/2022 Therapeutic Exercise: UBE level 1 2/2 fwd and backward for warm up while taking subjective/ Row 2x10 17# Shoulder extension 2x10 17# Wall slide 2x10 R shoulder flexion Seated bilateral ER 2x15 GTB  Seated horizontal abd 2x10 GTB R shoulder IR/ER RTB 3x10 Supine shoulder flexion AROM 2x10 - R S/L shoulder abd 2x10 R 2# Modalities: Location:  right shoulder Time:  10 minutes Pressure:  low Temperature:  34 degrees  [facility] Adult PT Treatment:                                                DATE: 02/13/2022 Therapeutic Exercise: UBE level 1 2/2 fwd and backward for warm up while taking subjective/ Row 2x10 17# Shoulder  extension 2x10 17# Wall slide 2x10 R shoulder flexion Seated bilateral ER 2x15 GTB  Seated horizontal abd 2x10 GTB R shoulder IR/ER RTB 3x10 Supine shoulder flexion AROM 2x10 - R S/L shoulder abd 2x10 R 2# Modalities: Location:  right shoulder Time:  10 minutes Pressure:  low Temperature:  34 degrees  [facility] Adult PT Treatment:                                                DATE: 02/11/2022 Therapeutic Exercise: UBE level 1 2/2 fwd and backward for warm up while taking subjective/ Row 2x10 17# Shoulder extension 2x10 17# Wall slide 2x10 R shoulder flexion Seated bilateral ER 2x15 GTB  Supine horizontal abd 2x10 GTB R shoulder IR/ER RTB 2x10 Supine shoulder flexion with ball 2x10 S/L shoulder abd 2x10 R 2# Therapeutic Activity: Assessment of tests/measures, goals, and outcomes for progress note  [facility] Adult PT Treatment:                                                DATE: 02/06/2022 Therapeutic Exercise: UBE level 1 2/2 fwd and backward for warm up while taking subjective Shoulder flexion/scaption with pulleys x 1 min each Row 2x10 17# Shoulder extension 2x10 17# Wall walk x 5 R shoulder flexion Seated bilateral ER 2x10 RTB  Seated horizontal abd 2x10 RTB R shoulder IR/ER RTB 2x10 - 3" hold Supine shoulder flexion with ball 2x10 (minimally painful arc) S/L shoulder abd 2x10 R Manual Therapy: PROM R shoulder flexion Modalities: Location:  right shoulder Time:  10 minutes Pressure:  low Temperature:  34 degrees  PATIENT EDUCATION: Education details: continue HEP Person educated: Patient Education method: Explanation, Demonstration, and Handouts Education comprehension: verbalized understanding and returned demonstration   HOME EXERCISE PROGRAM: Access Code: [email or web address] URL: [email or web address] Date: 02/13/2022 Prepared by: Octavio Manns  Exercises - Supine Shoulder Flexion Extension AAROM with Dowel  - 1 x daily - 7 x weekly - 2 sets - 10 reps -  Standing Shoulder Row with  Anchored Resistance  - 1 x daily - 7 x weekly - 3 sets - 10 reps - blue band hold - Seated Scapular Retraction  - 1 x daily - 7 x weekly - 2 sets - 10 reps - Standing Isometric Shoulder Internal Rotation at Doorway  - 1 x daily - 7 x weekly - 2 sets - 10 reps - 3 sec hold - Standing Isometric Shoulder External Rotation with Doorway  - 1 x daily - 7 x weekly - 2 sets - 10 reps - 3 sec hold - Shoulder External Rotation with Anchored Resistance  - 1 x daily - 7 x weekly - 2 sets - 10 reps - red band hold - Shoulder Internal Rotation with Resistance  - 1 x daily - 7 x weekly - 2 sets - 10 reps - red band hold   ASSESSMENT:   CLINICAL IMPRESSION: Pt able to complete prescribed exercises with no adverse effect or increase in pain. Therapy focused on improving R shoulder strength and ROM post op. PT will continue to progress as able per POC.     OBJECTIVE IMPAIRMENTS: decreased activity tolerance, decreased ROM, decreased strength, impaired UE functional use, and pain.    ACTIVITY LIMITATIONS: carrying, lifting, continence, bathing, toileting, and reach over head   PARTICIPATION LIMITATIONS: driving, shopping, community activity, and yard work   PERSONAL FACTORS: Fitness and 3+ comorbidities: HTN, DM II, Depression  are also affecting patient's functional outcome.      GOALS: Goals reviewed with patient? No   SHORT TERM GOALS: Target date: 01/13/2022   Pt will be compliant and knowledgeable with initial HEP for improved comfort and carryover Baseline: initial HEP given  Goal status: MET   2.  Pt will self report right shoulder pain no greater than 6/10 for improved comfort and functional ability Baseline: 10/10 at worst Goal status: MET   LONG TERM GOALS: Target date: 03/17/2022   Pt will improve FOTO function score to no less than 50% as proxy for functional improvement Baseline: 4% function 02/11/2022: 42% function Goal status: ONGOING   2.  Pt will self  report right shoulder pain no greater than 3/10 for improved comfort and functional ability Baseline: 10/10 at worst Goal status: ONGOING   3.  Pt will improve R shoulder flex/abd AROM to no less than 140 in order to improve functional ability with ADLs Baseline: see chart Goal status: ONGOING   4.  Pt will be able to reach overhead into cabinets not limited by pain for improved ADL performance Baseline: unable Goal status: ONGOING   PLAN:   PT FREQUENCY: 2x/week   PT DURATION: 12 weeks   PLANNED INTERVENTIONS: Therapeutic exercises, Therapeutic activity, Neuromuscular re-education, Balance training, Gait training, Patient/Family education, Self Care, Joint mobilization, Electrical stimulation, Cryotherapy, Moist heat, Vasopneumatic device, Manual therapy, and Re-evaluation   PLAN FOR NEXT SESSION: assess HEP response, PROM to R shoulder, vaso R shoulder, strengthening and ROM R shoulder   Ward Chatters, PT 02/24/2022, 2:44 PM

## 2022-02-25 NOTE — Therapy (Signed)
OUTPATIENT PHYSICAL THERAPY TREATMENT NOTE     Patient Name: Tracy Daugherty MRN: [id number] DOB:1960/07/14, 62 y.o., female Today's Date: 02/26/2022  PCP: Kennith Maes, PA-C  REFERRING PROVIDER: Frederico Hamman, MD   END OF SESSION:   PT End of Session - 02/26/22 1400     Visit Number 13    Number of Visits 24    Date for PT Re-Evaluation 03/17/22    Authorization Type UHC Medicare/Medicaid    PT Start Time 1400    PT Stop Time 1440    PT Time Calculation (min) 40 min    Activity Tolerance Patient tolerated treatment well;Patient limited by pain    Behavior During Therapy WFL for tasks assessed/performed                       Past Medical History:  Diagnosis Date   Arthritis    knees, ankles   Bronchitis    uses inhaler prn   Depression    Diabetes mellitus without complication (HCC)    type 2   GERD (gastroesophageal reflux disease)    Headache    controlled with meds   Hepatitis 2014   Hep C - was treated   HLD (hyperlipidemia)    Hypertension    Hyperthyroidism    RLL pneumonia    x 1   Seasonal allergies    Sleep apnea    uses cpap nightly   Wears glasses    Wears partial dentures    upper   Past Surgical History:  Procedure Laterality Date   ABDOMINAL HYSTERECTOMY     ANTERIOR (CYSTOCELE) AND POSTERIOR REPAIR (RECTOCELE) WITH XENFORM GRAFT AND SACROSPINOUS FIXATION  2013   ANTERIOR CERVICAL DECOMPRESSION/DISCECTOMY FUSION 4 LEVELS N/A 04/02/2020   Procedure: CERVICAL THREE-FOUR, CERVICAL FOUR-FIVE, CERVICAL FIVE-SIX, CERVICAL SIX-SEVEN ANTERIOR CERVICAL DECOMPRESSION/DISCECTOMY FUSION;  Surgeon: Donalee Citrin, MD;  Location: [facility] OR;  Service: Neurosurgery;  Laterality: N/A;   CHOLECYSTECTOMY     COLONOSCOPY     right sided transverse abdominas plane block under ultrasound guidance  2018   TUBAL LIGATION     UPPER GI ENDOSCOPY     WISDOM TOOTH EXTRACTION  1980s   Patient Active Problem List   Diagnosis Date Noted   Spinal stenosis in  cervical region 04/02/2020    REFERRING DIAG:  P/O RT SHLD SCOPE DISTAL CLAVICLECTOMY POSS OPEN ROTATOR CUFF REPAIR    THERAPY DIAG:  Acute pain of right shoulder  Muscle weakness (generalized)  Localized edema  Rationale for Evaluation and Treatment Rehabilitation  PERTINENT HISTORY: HTN, DM II, Depression   PRECAUTIONS: Shoulder  ONSET DATE: 12/18/2021   SUBJECTIVE:  SUBJECTIVE STATEMENT: Pt presents to PT with reports of continued shoulder pain. Also notes that she has R sided neck pain and discomfort. Has been compliant with HEP.    PAIN:  Are you having pain?  Yes: NPRS scale: 6/10 Worst: 10/10 Pain location: R anterior shoulder  Pain description: sharp, post surgical Aggravating factors: lying down Relieving factors: positioning    OBJECTIVE: (objective measures completed at initial evaluation unless otherwise dated)   DIAGNOSTIC FINDINGS:  See imaging    PATIENT SURVEYS:  FOTO: 42% function; 50% predicted    COGNITION: Overall cognitive status: Within functional limits for tasks assessed                                  SENSATION: WFL   POSTURE: Rounded shoulders, fwd head, sling donned on R UE   UPPER EXTREMITY ROM:    Passive ROM Right eval Right 12/14 Right 02/11/22 AROM  Shoulder flexion 45  140 112  Shoulder extension       Shoulder abduction 30  95 145  Shoulder adduction       Shoulder internal rotation 40  80   Shoulder external rotation 10  60   Elbow flexion       Elbow extension       Wrist flexion       Wrist extension       Wrist ulnar deviation       Wrist radial deviation       Wrist pronation       Wrist supination       (Blank rows = not tested)   UPPER EXTREMITY MMT:   MMT Right eval Left eval  Shoulder flexion      Shoulder  extension      Shoulder abduction      Shoulder adduction      Shoulder internal rotation      Shoulder external rotation      Middle trapezius      Lower trapezius      Elbow flexion      Elbow extension      Wrist flexion      Wrist extension      Wrist ulnar deviation      Wrist radial deviation      Wrist pronation      Wrist supination      Grip strength (lbs)      (Blank rows = not tested)   SHOULDER SPECIAL TESTS: Impingement tests: DNT SLAP lesions: DNT Instability tests: DNT Rotator cuff assessment: DNT Biceps assessment: DNT   JOINT MOBILITY TESTING:  DNT   PALPATION:  DNT             TREATMENT: [facility] Adult PT Treatment:                                                DATE: 02/26/2022 Therapeutic Exercise: UBE level 1 2/2 fwd and backward for warm up while taking subjective Row 2x10 17# Shoulder IR/ER 2x10 GTB Wall slide 2x10 R shoulder flexion Shoulder IR/ER RTB 2x10 each Manual Therapy: STM to bilateral cervical paraspinals, upper traps, suboccipitals  Modalities: Location:  right shoulder Time:  10 minutes Pressure:  low Temperature:  34 degrees  [facility] Adult PT Treatment:  DATE: 02/24/2022 Therapeutic Exercise: UBE level 1 2/2 fwd and backward for warm up while taking subjective Pulley's bilateral flexion x 2 min Row 2x10 17# Shoulder extension 2x10 17# Wall slide 2x10 R shoulder flexion Shoulder IR/ER RTB 2x10 each Supine horizontal abd 3x10 GTB Supine shoulder flexion with pball 2x15 S/L shoulder abd 2x10 R 2# S/L shoulder ER 2x10 2# Seated bilateral ER 2x15 GTB Modalities: Location:  right shoulder Time:  10 minutes Pressure:  low Temperature:  34 degrees  [facility] Adult PT Treatment:                                                DATE: 02/17/2022 Therapeutic Exercise: UBE level 1 2/2 fwd and backward for warm up while taking subjective/ Row 2x10 17# Shoulder extension 2x10 17# Wall slide 2x10 R  shoulder flexion Seated bilateral ER 2x15 GTB  Seated horizontal abd 2x10 GTB R shoulder IR/ER RTB 3x10 Supine shoulder flexion AROM 2x10 - R S/L shoulder abd 2x10 R 2# Modalities: Location:  right shoulder Time:  10 minutes Pressure:  low Temperature:  34 degrees  [facility] Adult PT Treatment:                                                DATE: 02/13/2022 Therapeutic Exercise: UBE level 1 2/2 fwd and backward for warm up while taking subjective/ Row 2x10 17# Shoulder extension 2x10 17# Wall slide 2x10 R shoulder flexion Seated bilateral ER 2x15 GTB  Seated horizontal abd 2x10 GTB R shoulder IR/ER RTB 3x10 Supine shoulder flexion AROM 2x10 - R S/L shoulder abd 2x10 R 2# Modalities: Location:  right shoulder Time:  10 minutes Pressure:  low Temperature:  34 degrees  [facility] Adult PT Treatment:                                                DATE: 02/11/2022 Therapeutic Exercise: UBE level 1 2/2 fwd and backward for warm up while taking subjective/ Row 2x10 17# Shoulder extension 2x10 17# Wall slide 2x10 R shoulder flexion Seated bilateral ER 2x15 GTB  Supine horizontal abd 2x10 GTB R shoulder IR/ER RTB 2x10 Supine shoulder flexion with ball 2x10 S/L shoulder abd 2x10 R 2# Therapeutic Activity: Assessment of tests/measures, goals, and outcomes for progress note  [facility] Adult PT Treatment:                                                DATE: 02/06/2022 Therapeutic Exercise: UBE level 1 2/2 fwd and backward for warm up while taking subjective Shoulder flexion/scaption with pulleys x 1 min each Row 2x10 17# Shoulder extension 2x10 17# Wall walk x 5 R shoulder flexion Seated bilateral ER 2x10 RTB  Seated horizontal abd 2x10 RTB R shoulder IR/ER RTB 2x10 - 3" hold Supine shoulder flexion with ball 2x10 (minimally painful arc) S/L shoulder abd 2x10 R Manual Therapy: PROM R shoulder flexion Modalities: Location:  right shoulder Time:  10 minutes Pressure:  low Temperature:  34  degrees  PATIENT EDUCATION: Education details: continue HEP Person educated: Patient Education method: Explanation, Demonstration, and Handouts Education comprehension: verbalized understanding and returned demonstration   HOME EXERCISE PROGRAM: Access Code: [email or web address] URL: [email or web address] Date: 02/13/2022 Prepared by: Octavio Manns  Exercises - Supine Shoulder Flexion Extension AAROM with Dowel  - 1 x daily - 7 x weekly - 2 sets - 10 reps - Standing Shoulder Row with Anchored Resistance  - 1 x daily - 7 x weekly - 3 sets - 10 reps - blue band hold - Seated Scapular Retraction  - 1 x daily - 7 x weekly - 2 sets - 10 reps - Standing Isometric Shoulder Internal Rotation at Doorway  - 1 x daily - 7 x weekly - 2 sets - 10 reps - 3 sec hold - Standing Isometric Shoulder External Rotation with Doorway  - 1 x daily - 7 x weekly - 2 sets - 10 reps - 3 sec hold - Shoulder External Rotation with Anchored Resistance  - 1 x daily - 7 x weekly - 2 sets - 10 reps - red band hold - Shoulder Internal Rotation with Resistance  - 1 x daily - 7 x weekly - 2 sets - 10 reps - red band hold   ASSESSMENT:   CLINICAL IMPRESSION: Pt able to complete prescribed exercises with no adverse effect or increase in pain. Therapy focused on improving R shoulder strength and ROM post op. Responded well to manual therapy, noted decreased pain post session. PT will continue to progress as able per POC.     OBJECTIVE IMPAIRMENTS: decreased activity tolerance, decreased ROM, decreased strength, impaired UE functional use, and pain.    ACTIVITY LIMITATIONS: carrying, lifting, continence, bathing, toileting, and reach over head   PARTICIPATION LIMITATIONS: driving, shopping, community activity, and yard work   PERSONAL FACTORS: Fitness and 3+ comorbidities: HTN, DM II, Depression  are also affecting patient's functional outcome.      GOALS: Goals reviewed with patient? No   SHORT TERM GOALS: Target  date: 01/13/2022   Pt will be compliant and knowledgeable with initial HEP for improved comfort and carryover Baseline: initial HEP given  Goal status: MET   2.  Pt will self report right shoulder pain no greater than 6/10 for improved comfort and functional ability Baseline: 10/10 at worst Goal status: MET   LONG TERM GOALS: Target date: 03/17/2022   Pt will improve FOTO function score to no less than 50% as proxy for functional improvement Baseline: 4% function 02/11/2022: 42% function Goal status: ONGOING   2.  Pt will self report right shoulder pain no greater than 3/10 for improved comfort and functional ability Baseline: 10/10 at worst Goal status: ONGOING   3.  Pt will improve R shoulder flex/abd AROM to no less than 140 in order to improve functional ability with ADLs Baseline: see chart Goal status: ONGOING   4.  Pt will be able to reach overhead into cabinets not limited by pain for improved ADL performance Baseline: unable Goal status: ONGOING   PLAN:   PT FREQUENCY: 2x/week   PT DURATION: 12 weeks   PLANNED INTERVENTIONS: Therapeutic exercises, Therapeutic activity, Neuromuscular re-education, Balance training, Gait training, Patient/Family education, Self Care, Joint mobilization, Electrical stimulation, Cryotherapy, Moist heat, Vasopneumatic device, Manual therapy, and Re-evaluation   PLAN FOR NEXT SESSION: assess HEP response, PROM to R shoulder, vaso R shoulder, strengthening and ROM R shoulder   Ward Chatters, PT 02/26/2022, 3:33 PM

## 2022-02-26 ENCOUNTER — Ambulatory Visit: Payer: 59

## 2022-02-26 DIAGNOSIS — M25511 Pain in right shoulder: Secondary | ICD-10-CM | POA: Diagnosis not present

## 2022-02-26 DIAGNOSIS — M6281 Muscle weakness (generalized): Secondary | ICD-10-CM

## 2022-02-26 DIAGNOSIS — R600 Localized edema: Secondary | ICD-10-CM

## 2022-03-10 NOTE — Therapy (Signed)
OUTPATIENT PHYSICAL THERAPY TREATMENT NOTE     Patient Name: Tracy Daugherty MRN: [id number] DOB:10-10-1960, 62 y.o., female Today's Date: 03/11/2022  PCP: Tito Dine, PA-C  REFERRING PROVIDER: Earlie Server, MD   END OF SESSION:   PT End of Session - 03/11/22 1128     Visit Number 14    Number of Visits 24    Date for PT Re-Evaluation 03/17/22    Authorization Type UHC Medicare/Medicaid    PT Start Time 1130    PT Stop Time 1210    PT Time Calculation (min) 40 min    Activity Tolerance Patient tolerated treatment well;Patient limited by pain    Behavior During Therapy WFL for tasks assessed/performed                        Past Medical History:  Diagnosis Date   Arthritis    knees, ankles   Bronchitis    uses inhaler prn   Depression    Diabetes mellitus without complication ([place])    type 2   GERD (gastroesophageal reflux disease)    Headache    controlled with meds   Hepatitis 2014   Hep C - was treated   HLD (hyperlipidemia)    Hypertension    Hyperthyroidism    RLL pneumonia    x 1   Seasonal allergies    Sleep apnea    uses cpap nightly   Wears glasses    Wears partial dentures    upper   Past Surgical History:  Procedure Laterality Date   ABDOMINAL HYSTERECTOMY     ANTERIOR (CYSTOCELE) AND POSTERIOR REPAIR (RECTOCELE) WITH XENFORM GRAFT AND SACROSPINOUS FIXATION  2013   ANTERIOR CERVICAL DECOMPRESSION/DISCECTOMY FUSION 4 LEVELS N/A 04/02/2020   Procedure: CERVICAL THREE-FOUR, CERVICAL FOUR-FIVE, CERVICAL FIVE-SIX, CERVICAL SIX-SEVEN ANTERIOR CERVICAL DECOMPRESSION/DISCECTOMY FUSION;  Surgeon: Kary Kos, MD;  Location: [facility];  Service: Neurosurgery;  Laterality: N/A;   CHOLECYSTECTOMY     COLONOSCOPY     right sided transverse abdominas plane block under ultrasound guidance  2018   TUBAL LIGATION     UPPER GI ENDOSCOPY     WISDOM TOOTH EXTRACTION  1980s   Patient Active Problem List   Diagnosis Date Noted   Spinal stenosis in  cervical region 04/02/2020    REFERRING DIAG:  P/O RT SHLD SCOPE DISTAL CLAVICLECTOMY POSS OPEN ROTATOR CUFF REPAIR    THERAPY DIAG:  Acute pain of right shoulder  Muscle weakness (generalized)  Localized edema  Rationale for Evaluation and Treatment Rehabilitation  PERTINENT HISTORY: HTN, DM II, Depression   PRECAUTIONS: Shoulder  ONSET DATE: 12/18/2021   SUBJECTIVE:  SUBJECTIVE STATEMENT:  Pt presents to PT with reports continued bilateral shoulder pain. Had an injection on 03/06/2022 into L shoulder.    PAIN:  Are you having pain?  Yes: NPRS scale: 8/10 Worst: 10/10 Pain location: R anterior shoulder  Pain description: sharp, post surgical Aggravating factors: lying down Relieving factors: positioning    OBJECTIVE: (objective measures completed at initial evaluation unless otherwise dated)   DIAGNOSTIC FINDINGS:  See imaging    PATIENT SURVEYS:  FOTO: 42% function; 50% predicted    COGNITION: Overall cognitive status: Within functional limits for tasks assessed                                  SENSATION: WFL   POSTURE: Rounded shoulders, fwd head, sling donned on R UE   UPPER EXTREMITY ROM:    Passive ROM Right eval Right 12/14 Right 02/11/22 AROM  Shoulder flexion 45  140 112  Shoulder extension       Shoulder abduction 30  95 145  Shoulder adduction       Shoulder internal rotation 40  80   Shoulder external rotation 10  60   Elbow flexion       Elbow extension       Wrist flexion       Wrist extension       Wrist ulnar deviation       Wrist radial deviation       Wrist pronation       Wrist supination       (Blank rows = not tested)   UPPER EXTREMITY MMT:   MMT Right eval Left eval  Shoulder flexion      Shoulder extension      Shoulder abduction       Shoulder adduction      Shoulder internal rotation      Shoulder external rotation      Middle trapezius      Lower trapezius      Elbow flexion      Elbow extension      Wrist flexion      Wrist extension      Wrist ulnar deviation      Wrist radial deviation      Wrist pronation      Wrist supination      Grip strength (lbs)      (Blank rows = not tested)   SHOULDER SPECIAL TESTS: Impingement tests: DNT SLAP lesions: DNT Instability tests: DNT Rotator cuff assessment: DNT Biceps assessment: DNT   JOINT MOBILITY TESTING:  DNT   PALPATION:  DNT             TREATMENT: [facility] Adult PT Treatment:                                                DATE: 03/11/2022 Therapeutic Exercise: UBE level 1 2/2 fwd and backward for warm up while taking subjective Seated low row 3x10 20# Seated lat pulldown 2x10 20# Shoulder IR/ER 2x15 GTB  Shoulder ext 2x15 BTB S/L shoulder abd 2x10 2# S/L shoulder ER 2x10 2# Supine horizontal abd 2x15 GTB Supine D2 flexion GTB x 10 each  [facility] Adult PT Treatment:  DATE: 02/26/2022 Therapeutic Exercise: UBE level 1 2/2 fwd and backward for warm up while taking subjective Row 2x10 17# Shoulder IR/ER 2x10 GTB Wall slide 2x10 R shoulder flexion Shoulder IR/ER RTB 2x10 each Manual Therapy: STM to bilateral cervical paraspinals, upper traps, suboccipitals  Modalities: Location:  right shoulder Time:  10 minutes Pressure:  low Temperature:  34 degrees  [facility] Adult PT Treatment:                                                DATE: 02/24/2022 Therapeutic Exercise: UBE level 1 2/2 fwd and backward for warm up while taking subjective Pulley's bilateral flexion x 2 min Row 2x10 17# Shoulder extension 2x10 17# Wall slide 2x10 R shoulder flexion Shoulder IR/ER RTB 2x10 each Supine horizontal abd 3x10 GTB Supine shoulder flexion with pball 2x15 S/L shoulder abd 2x10 R 2# S/L shoulder ER 2x10 2# Seated  bilateral ER 2x15 GTB Modalities: Location:  right shoulder Time:  10 minutes Pressure:  low Temperature:  34 degrees  [facility] Adult PT Treatment:                                                DATE: 02/17/2022 Therapeutic Exercise: UBE level 1 2/2 fwd and backward for warm up while taking subjective/ Row 2x10 17# Shoulder extension 2x10 17# Wall slide 2x10 R shoulder flexion Seated bilateral ER 2x15 GTB  Seated horizontal abd 2x10 GTB R shoulder IR/ER RTB 3x10 Supine shoulder flexion AROM 2x10 - R S/L shoulder abd 2x10 R 2# Modalities: Location:  right shoulder Time:  10 minutes Pressure:  low Temperature:  34 degrees  [facility] Adult PT Treatment:                                                DATE: 02/13/2022 Therapeutic Exercise: UBE level 1 2/2 fwd and backward for warm up while taking subjective/ Row 2x10 17# Shoulder extension 2x10 17# Wall slide 2x10 R shoulder flexion Seated bilateral ER 2x15 GTB  Seated horizontal abd 2x10 GTB R shoulder IR/ER RTB 3x10 Supine shoulder flexion AROM 2x10 - R S/L shoulder abd 2x10 R 2# Modalities: Location:  right shoulder Time:  10 minutes Pressure:  low Temperature:  34 degrees  [facility] Adult PT Treatment:                                                DATE: 02/11/2022 Therapeutic Exercise: UBE level 1 2/2 fwd and backward for warm up while taking subjective/ Row 2x10 17# Shoulder extension 2x10 17# Wall slide 2x10 R shoulder flexion Seated bilateral ER 2x15 GTB  Supine horizontal abd 2x10 GTB R shoulder IR/ER RTB 2x10 Supine shoulder flexion with ball 2x10 S/L shoulder abd 2x10 R 2# Therapeutic Activity: Assessment of tests/measures, goals, and outcomes for progress note  [facility] Adult PT Treatment:  DATE: 02/06/2022 Therapeutic Exercise: UBE level 1 2/2 fwd and backward for warm up while taking subjective Shoulder flexion/scaption with pulleys x 1 min each Row 2x10 17# Shoulder  extension 2x10 17# Wall walk x 5 R shoulder flexion Seated bilateral ER 2x10 RTB  Seated horizontal abd 2x10 RTB R shoulder IR/ER RTB 2x10 - 3" hold Supine shoulder flexion with ball 2x10 (minimally painful arc) S/L shoulder abd 2x10 R Manual Therapy: PROM R shoulder flexion Modalities: Location:  right shoulder Time:  10 minutes Pressure:  low Temperature:  34 degrees  PATIENT EDUCATION: Education details: continue HEP Person educated: Patient Education method: Explanation, Demonstration, and Handouts Education comprehension: verbalized understanding and returned demonstration   HOME EXERCISE PROGRAM: Access Code: [email or web address] URL: [email or web address] Date: 02/13/2022 Prepared by: Octavio Manns  Exercises - Supine Shoulder Flexion Extension AAROM with Dowel  - 1 x daily - 7 x weekly - 2 sets - 10 reps - Standing Shoulder Row with Anchored Resistance  - 1 x daily - 7 x weekly - 3 sets - 10 reps - blue band hold - Seated Scapular Retraction  - 1 x daily - 7 x weekly - 2 sets - 10 reps - Standing Isometric Shoulder Internal Rotation at Doorway  - 1 x daily - 7 x weekly - 2 sets - 10 reps - 3 sec hold - Standing Isometric Shoulder External Rotation with Doorway  - 1 x daily - 7 x weekly - 2 sets - 10 reps - 3 sec hold - Shoulder External Rotation with Anchored Resistance  - 1 x daily - 7 x weekly - 2 sets - 10 reps - red band hold - Shoulder Internal Rotation with Resistance  - 1 x daily - 7 x weekly - 2 sets - 10 reps - red band hold   ASSESSMENT:   CLINICAL IMPRESSION: Pt able to complete prescribed exercises with no adverse effect or increase in pain. Therapy focused on improving R shoulder strength and ROM post op. PT will continue to progress as able per POC.       OBJECTIVE IMPAIRMENTS: decreased activity tolerance, decreased ROM, decreased strength, impaired UE functional use, and pain.    ACTIVITY LIMITATIONS: carrying, lifting, continence, bathing,  toileting, and reach over head   PARTICIPATION LIMITATIONS: driving, shopping, community activity, and yard work   PERSONAL FACTORS: Fitness and 3+ comorbidities: HTN, DM II, Depression  are also affecting patient's functional outcome.      GOALS: Goals reviewed with patient? No   SHORT TERM GOALS: Target date: 01/13/2022   Pt will be compliant and knowledgeable with initial HEP for improved comfort and carryover Baseline: initial HEP given  Goal status: MET   2.  Pt will self report right shoulder pain no greater than 6/10 for improved comfort and functional ability Baseline: 10/10 at worst Goal status: MET   LONG TERM GOALS: Target date: 03/17/2022   Pt will improve FOTO function score to no less than 50% as proxy for functional improvement Baseline: 4% function 02/11/2022: 42% function Goal status: ONGOING   2.  Pt will self report right shoulder pain no greater than 3/10 for improved comfort and functional ability Baseline: 10/10 at worst Goal status: ONGOING   3.  Pt will improve R shoulder flex/abd AROM to no less than 140 in order to improve functional ability with ADLs Baseline: see chart Goal status: ONGOING   4.  Pt will be able to reach overhead into cabinets not limited by pain for improved ADL  performance Baseline: unable Goal status: ONGOING   PLAN:   PT FREQUENCY: 2x/week   PT DURATION: 12 weeks   PLANNED INTERVENTIONS: Therapeutic exercises, Therapeutic activity, Neuromuscular re-education, Balance training, Gait training, Patient/Family education, Self Care, Joint mobilization, Electrical stimulation, Cryotherapy, Moist heat, Vasopneumatic device, Manual therapy, and Re-evaluation   PLAN FOR NEXT SESSION: assess HEP response, PROM to R shoulder, vaso R shoulder, strengthening and ROM R shoulder   Ward Chatters, PT 03/11/2022, 12:14 PM

## 2022-03-11 ENCOUNTER — Ambulatory Visit: Payer: 59

## 2022-03-11 DIAGNOSIS — R600 Localized edema: Secondary | ICD-10-CM

## 2022-03-11 DIAGNOSIS — M25511 Pain in right shoulder: Secondary | ICD-10-CM | POA: Diagnosis not present

## 2022-03-11 DIAGNOSIS — M6281 Muscle weakness (generalized): Secondary | ICD-10-CM

## 2022-03-13 ENCOUNTER — Ambulatory Visit: Payer: 59

## 2022-03-13 DIAGNOSIS — R600 Localized edema: Secondary | ICD-10-CM

## 2022-03-13 DIAGNOSIS — M25511 Pain in right shoulder: Secondary | ICD-10-CM

## 2022-03-13 DIAGNOSIS — M6281 Muscle weakness (generalized): Secondary | ICD-10-CM

## 2022-03-13 NOTE — Therapy (Signed)
OUTPATIENT PHYSICAL THERAPY TREATMENT NOTE     Patient Name: Tracy Daugherty MRN: [id number] DOB:08-18-1960, 62 y.o., female Today's Date: 03/13/2022  PCP: Tito Dine, PA-C  REFERRING PROVIDER: Earlie Server, MD   END OF SESSION:   PT End of Session - 03/13/22 1135     Visit Number 15    Number of Visits 24    Date for PT Re-Evaluation 03/17/22    Authorization Type [employer] Medicare/Medicaid    PT Start Time 1133    PT Stop Time 1211    PT Time Calculation (min) 38 min    Activity Tolerance Patient tolerated treatment well;Patient limited by pain    Behavior During Therapy WFL for tasks assessed/performed                         Past Medical History:  Diagnosis Date   Arthritis    knees, ankles   Bronchitis    uses inhaler prn   Depression    Diabetes mellitus without complication ([place])    type 2   GERD (gastroesophageal reflux disease)    Headache    controlled with meds   Hepatitis 2014   Hep C - was treated   HLD (hyperlipidemia)    Hypertension    Hyperthyroidism    RLL pneumonia    x 1   Seasonal allergies    Sleep apnea    uses cpap nightly   Wears glasses    Wears partial dentures    upper   Past Surgical History:  Procedure Laterality Date   ABDOMINAL HYSTERECTOMY     ANTERIOR (CYSTOCELE) AND POSTERIOR REPAIR (RECTOCELE) WITH XENFORM GRAFT AND SACROSPINOUS FIXATION  2013   ANTERIOR CERVICAL DECOMPRESSION/DISCECTOMY FUSION 4 LEVELS N/A 04/02/2020   Procedure: CERVICAL THREE-FOUR, CERVICAL FOUR-FIVE, CERVICAL FIVE-SIX, CERVICAL SIX-SEVEN ANTERIOR CERVICAL DECOMPRESSION/DISCECTOMY FUSION;  Surgeon: Kary Kos, MD;  Location: [facility];  Service: Neurosurgery;  Laterality: N/A;   CHOLECYSTECTOMY     COLONOSCOPY     right sided transverse abdominas plane block under ultrasound guidance  2018   TUBAL LIGATION     UPPER GI ENDOSCOPY     WISDOM TOOTH EXTRACTION  1980s   Patient Active Problem List   Diagnosis Date Noted   Spinal stenosis  in cervical region 04/02/2020    REFERRING DIAG:  P/O RT SHLD SCOPE DISTAL CLAVICLECTOMY POSS OPEN ROTATOR CUFF REPAIR    THERAPY DIAG:  Acute pain of right shoulder  Muscle weakness (generalized)  Localized edema  Rationale for Evaluation and Treatment Rehabilitation  PERTINENT HISTORY: HTN, DM II, Depression   PRECAUTIONS: Shoulder  ONSET DATE: 12/18/2021   SUBJECTIVE:  SUBJECTIVE STATEMENT:  Pt presents to PT with reports of continued bilateral shoulder pain, feels like it is electric shock feeling. Has been compliant with HEP.    PAIN:  Are you having pain?  Yes: NPRS scale: 8/10 Worst: 10/10 Pain location: R anterior shoulder  Pain description: sharp, post surgical Aggravating factors: lying down Relieving factors: positioning    OBJECTIVE: (objective measures completed at initial evaluation unless otherwise dated)   DIAGNOSTIC FINDINGS:  See imaging    PATIENT SURVEYS:  FOTO: 42% function; 50% predicted    COGNITION: Overall cognitive status: Within functional limits for tasks assessed                                  SENSATION: WFL   POSTURE: Rounded shoulders, fwd head, sling donned on R UE   UPPER EXTREMITY ROM:    Passive ROM Right eval Right 12/14 Right 02/11/22 AROM  Shoulder flexion 45  140 112  Shoulder extension       Shoulder abduction 30  95 145  Shoulder adduction       Shoulder internal rotation 40  80   Shoulder external rotation 10  60   Elbow flexion       Elbow extension       Wrist flexion       Wrist extension       Wrist ulnar deviation       Wrist radial deviation       Wrist pronation       Wrist supination       (Blank rows = not tested)   UPPER EXTREMITY MMT:   MMT Right eval Left eval  Shoulder flexion      Shoulder extension       Shoulder abduction      Shoulder adduction      Shoulder internal rotation      Shoulder external rotation      Middle trapezius      Lower trapezius      Elbow flexion      Elbow extension      Wrist flexion      Wrist extension      Wrist ulnar deviation      Wrist radial deviation      Wrist pronation      Wrist supination      Grip strength (lbs)      (Blank rows = not tested)   SHOULDER SPECIAL TESTS: Impingement tests: DNT SLAP lesions: DNT Instability tests: DNT Rotator cuff assessment: DNT Biceps assessment: DNT   JOINT MOBILITY TESTING:  DNT   PALPATION:  DNT             TREATMENT: [facility] Adult PT Treatment:                                                DATE: 03/13/2022 Therapeutic Exercise: UBE level 1 2/2 fwd and backward for warm up while taking subjective Seated low row 3x10 20# Seated lat pulldown 2x10 20# Shoulder IR/ER 2x10 3# Supine horizontal abd 2x15 GTB Supine bilateral ER 2x10 GTB S/L shoulder abd x 10 2# R S/L shoulder ER 2x10 2# R Modalities: Location:  right shoulder Time:  10 minutes Pressure:  low Temperature:  34 degrees  [facility] Adult PT  Treatment:                                                DATE: 03/11/2022 Therapeutic Exercise: UBE level 1 2/2 fwd and backward for warm up while taking subjective Seated low row 3x10 20# Seated lat pulldown 2x10 20# Shoulder IR/ER 2x15 GTB  Shoulder ext 2x15 BTB S/L shoulder abd 2x10 2# S/L shoulder ER 2x10 2# Supine horizontal abd 2x15 GTB Supine D2 flexion GTB x 10 each  [facility] Adult PT Treatment:                                                DATE: 02/26/2022 Therapeutic Exercise: UBE level 1 2/2 fwd and backward for warm up while taking subjective Row 2x10 17# Shoulder IR/ER 2x10 GTB Wall slide 2x10 R shoulder flexion Shoulder IR/ER RTB 2x10 each Manual Therapy: STM to bilateral cervical paraspinals, upper traps, suboccipitals  Modalities: Location:  right shoulder Time:  10  minutes Pressure:  low Temperature:  34 degrees  [facility] Adult PT Treatment:                                                DATE: 02/24/2022 Therapeutic Exercise: UBE level 1 2/2 fwd and backward for warm up while taking subjective Pulley's bilateral flexion x 2 min Row 2x10 17# Shoulder extension 2x10 17# Wall slide 2x10 R shoulder flexion Shoulder IR/ER RTB 2x10 each Supine horizontal abd 3x10 GTB Supine shoulder flexion with pball 2x15 S/L shoulder abd 2x10 R 2# S/L shoulder ER 2x10 2# Seated bilateral ER 2x15 GTB Modalities: Location:  right shoulder Time:  10 minutes Pressure:  low Temperature:  34 degrees  [facility] Adult PT Treatment:                                                DATE: 02/17/2022 Therapeutic Exercise: UBE level 1 2/2 fwd and backward for warm up while taking subjective/ Row 2x10 17# Shoulder extension 2x10 17# Wall slide 2x10 R shoulder flexion Seated bilateral ER 2x15 GTB  Seated horizontal abd 2x10 GTB R shoulder IR/ER RTB 3x10 Supine shoulder flexion AROM 2x10 - R S/L shoulder abd 2x10 R 2# Modalities: Location:  right shoulder Time:  10 minutes Pressure:  low Temperature:  34 degrees  [facility] Adult PT Treatment:                                                DATE: 02/13/2022 Therapeutic Exercise: UBE level 1 2/2 fwd and backward for warm up while taking subjective/ Row 2x10 17# Shoulder extension 2x10 17# Wall slide 2x10 R shoulder flexion Seated bilateral ER 2x15 GTB  Seated horizontal abd 2x10 GTB R shoulder IR/ER RTB 3x10 Supine shoulder flexion AROM 2x10 - R S/L shoulder abd 2x10 R 2# Modalities: Location:  right shoulder  Time:  10 minutes Pressure:  low Temperature:  34 degrees  [facility] Adult PT Treatment:                                                DATE: 02/11/2022 Therapeutic Exercise: UBE level 1 2/2 fwd and backward for warm up while taking subjective/ Row 2x10 17# Shoulder extension 2x10 17# Wall slide 2x10 R shoulder  flexion Seated bilateral ER 2x15 GTB  Supine horizontal abd 2x10 GTB R shoulder IR/ER RTB 2x10 Supine shoulder flexion with ball 2x10 S/L shoulder abd 2x10 R 2# Therapeutic Activity: Assessment of tests/measures, goals, and outcomes for progress note  [facility] Adult PT Treatment:                                                DATE: 02/06/2022 Therapeutic Exercise: UBE level 1 2/2 fwd and backward for warm up while taking subjective Shoulder flexion/scaption with pulleys x 1 min each Row 2x10 17# Shoulder extension 2x10 17# Wall walk x 5 R shoulder flexion Seated bilateral ER 2x10 RTB  Seated horizontal abd 2x10 RTB R shoulder IR/ER RTB 2x10 - 3" hold Supine shoulder flexion with ball 2x10 (minimally painful arc) S/L shoulder abd 2x10 R Manual Therapy: PROM R shoulder flexion Modalities: Location:  right shoulder Time:  10 minutes Pressure:  low Temperature:  34 degrees  PATIENT EDUCATION: Education details: continue HEP Person educated: Patient Education method: Explanation, Demonstration, and Handouts Education comprehension: verbalized understanding and returned demonstration   HOME EXERCISE PROGRAM: Access Code: [email or web address] URL: [email or web address] Date: 02/13/2022 Prepared by: Octavio Manns  Exercises - Supine Shoulder Flexion Extension AAROM with Dowel  - 1 x daily - 7 x weekly - 2 sets - 10 reps - Standing Shoulder Row with Anchored Resistance  - 1 x daily - 7 x weekly - 3 sets - 10 reps - blue band hold - Seated Scapular Retraction  - 1 x daily - 7 x weekly - 2 sets - 10 reps - Standing Isometric Shoulder Internal Rotation at Doorway  - 1 x daily - 7 x weekly - 2 sets - 10 reps - 3 sec hold - Standing Isometric Shoulder External Rotation with Doorway  - 1 x daily - 7 x weekly - 2 sets - 10 reps - 3 sec hold - Shoulder External Rotation with Anchored Resistance  - 1 x daily - 7 x weekly - 2 sets - 10 reps - red band hold - Shoulder Internal Rotation with  Resistance  - 1 x daily - 7 x weekly - 2 sets - 10 reps - red band hold   ASSESSMENT:   CLINICAL IMPRESSION: Pt able to complete prescribed exercises with no adverse effect or increase in pain. Therapy focused on improving R shoulder strength and ROM post op. PT will continue to progress as able per POC.           OBJECTIVE IMPAIRMENTS: decreased activity tolerance, decreased ROM, decreased strength, impaired UE functional use, and pain.    ACTIVITY LIMITATIONS: carrying, lifting, continence, bathing, toileting, and reach over head   PARTICIPATION LIMITATIONS: driving, shopping, community activity, and yard work   PERSONAL FACTORS: Fitness and 3+ comorbidities: HTN, DM II, Depression  are also  affecting patient's functional outcome.      GOALS: Goals reviewed with patient? No   SHORT TERM GOALS: Target date: 01/13/2022   Pt will be compliant and knowledgeable with initial HEP for improved comfort and carryover Baseline: initial HEP given  Goal status: MET   2.  Pt will self report right shoulder pain no greater than 6/10 for improved comfort and functional ability Baseline: 10/10 at worst Goal status: MET   LONG TERM GOALS: Target date: 03/17/2022   Pt will improve FOTO function score to no less than 50% as proxy for functional improvement Baseline: 4% function 02/11/2022: 42% function Goal status: ONGOING   2.  Pt will self report right shoulder pain no greater than 3/10 for improved comfort and functional ability Baseline: 10/10 at worst Goal status: ONGOING   3.  Pt will improve R shoulder flex/abd AROM to no less than 140 in order to improve functional ability with ADLs Baseline: see chart Goal status: ONGOING   4.  Pt will be able to reach overhead into cabinets not limited by pain for improved ADL performance Baseline: unable Goal status: ONGOING   PLAN:   PT FREQUENCY: 2x/week   PT DURATION: 12 weeks   PLANNED INTERVENTIONS: Therapeutic exercises,  Therapeutic activity, Neuromuscular re-education, Balance training, Gait training, Patient/Family education, Self Care, Joint mobilization, Electrical stimulation, Cryotherapy, Moist heat, Vasopneumatic device, Manual therapy, and Re-evaluation   PLAN FOR NEXT SESSION: assess HEP response, PROM to R shoulder, vaso R shoulder, strengthening and ROM R shoulder   Ward Chatters, PT 03/13/2022, 1:32 PM

## 2022-03-17 ENCOUNTER — Ambulatory Visit: Payer: 59

## 2022-03-17 NOTE — Therapy (Incomplete)
OUTPATIENT PHYSICAL THERAPY TREATMENT NOTE     Patient Name: Tracy Daugherty MRN: [id number] DOB:09-24-1960, 62 y.o., female Today's Date: 03/17/2022  PCP: Tito Dine, PA-C  REFERRING PROVIDER: Earlie Server, MD   END OF SESSION:                 Past Medical History:  Diagnosis Date   Arthritis    knees, ankles   Bronchitis    uses inhaler prn   Depression    Diabetes mellitus without complication ([place])    type 2   GERD (gastroesophageal reflux disease)    Headache    controlled with meds   Hepatitis 2014   Hep C - was treated   HLD (hyperlipidemia)    Hypertension    Hyperthyroidism    RLL pneumonia    x 1   Seasonal allergies    Sleep apnea    uses cpap nightly   Wears glasses    Wears partial dentures    upper   Past Surgical History:  Procedure Laterality Date   ABDOMINAL HYSTERECTOMY     ANTERIOR (CYSTOCELE) AND POSTERIOR REPAIR (RECTOCELE) WITH XENFORM GRAFT AND SACROSPINOUS FIXATION  2013   ANTERIOR CERVICAL DECOMPRESSION/DISCECTOMY FUSION 4 LEVELS N/A 04/02/2020   Procedure: CERVICAL THREE-FOUR, CERVICAL FOUR-FIVE, CERVICAL FIVE-SIX, CERVICAL SIX-SEVEN ANTERIOR CERVICAL DECOMPRESSION/DISCECTOMY FUSION;  Surgeon: Kary Kos, MD;  Location: [facility];  Service: Neurosurgery;  Laterality: N/A;   CHOLECYSTECTOMY     COLONOSCOPY     right sided transverse abdominas plane block under ultrasound guidance  2018   TUBAL LIGATION     UPPER GI ENDOSCOPY     WISDOM TOOTH EXTRACTION  1980s   Patient Active Problem List   Diagnosis Date Noted   Spinal stenosis in cervical region 04/02/2020    REFERRING DIAG:  P/O RT SHLD SCOPE DISTAL CLAVICLECTOMY POSS OPEN ROTATOR CUFF REPAIR    THERAPY DIAG:  No diagnosis found.  Rationale for Evaluation and Treatment Rehabilitation  PERTINENT HISTORY: HTN, DM II, Depression   PRECAUTIONS: Shoulder  ONSET DATE: 12/18/2021   SUBJECTIVE:                                                                                                                                                                                       SUBJECTIVE STATEMENT:  ***   PAIN:  Are you having pain?  Yes: NPRS scale: 8/10 Worst: 10/10 Pain location: R anterior shoulder  Pain description: sharp, post surgical Aggravating factors: lying down Relieving factors: positioning    OBJECTIVE: (objective measures completed at initial evaluation unless otherwise dated)   DIAGNOSTIC FINDINGS:  See imaging    PATIENT SURVEYS:  FOTO: 42% function; 50% predicted    COGNITION: Overall cognitive status: Within functional limits for tasks assessed                                  SENSATION: WFL   POSTURE: Rounded shoulders, fwd head, sling donned on R UE   UPPER EXTREMITY ROM:    Passive ROM Right eval Right 12/14 Right 02/11/22 AROM  Shoulder flexion 45  140 112  Shoulder extension       Shoulder abduction 30  95 145  Shoulder adduction       Shoulder internal rotation 40  80   Shoulder external rotation 10  60   Elbow flexion       Elbow extension       Wrist flexion       Wrist extension       Wrist ulnar deviation       Wrist radial deviation       Wrist pronation       Wrist supination       (Blank rows = not tested)   UPPER EXTREMITY MMT:   MMT Right eval Left eval  Shoulder flexion      Shoulder extension      Shoulder abduction      Shoulder adduction      Shoulder internal rotation      Shoulder external rotation      Middle trapezius      Lower trapezius      Elbow flexion      Elbow extension      Wrist flexion      Wrist extension      Wrist ulnar deviation      Wrist radial deviation      Wrist pronation      Wrist supination      Grip strength (lbs)      (Blank rows = not tested)   SHOULDER SPECIAL TESTS: Impingement tests: DNT SLAP lesions: DNT Instability tests: DNT Rotator cuff assessment: DNT Biceps assessment: DNT   JOINT MOBILITY TESTING:  DNT   PALPATION:   DNT             TREATMENT: [facility] Adult PT Treatment:                                                DATE: 03/17/2022 Therapeutic Exercise: UBE level 1 2/2 fwd and backward for warm up while taking subjective Seated low row 3x10 20# Seated lat pulldown 2x10 20# Shoulder IR/ER 2x10 3# Supine horizontal abd 2x15 GTB Supine bilateral ER 2x10 GTB S/L shoulder abd x 10 2# R S/L shoulder ER 2x10 2# R Modalities: Location:  right shoulder Time:  10 minutes Pressure:  low Temperature:  34 degrees  [facility] Adult PT Treatment:                                                DATE: 03/13/2022 Therapeutic Exercise: UBE level 1 2/2 fwd and backward for warm up while taking subjective Seated low row 3x10 20# Seated lat pulldown 2x10 20# Shoulder IR/ER 2x10 3# Supine horizontal abd 2x15 GTB Supine bilateral  ER 2x10 GTB S/L shoulder abd x 10 2# R S/L shoulder ER 2x10 2# R Modalities: Location:  right shoulder Time:  10 minutes Pressure:  low Temperature:  34 degrees  [facility] Adult PT Treatment:                                                DATE: 03/11/2022 Therapeutic Exercise: UBE level 1 2/2 fwd and backward for warm up while taking subjective Seated low row 3x10 20# Seated lat pulldown 2x10 20# Shoulder IR/ER 2x15 GTB  Shoulder ext 2x15 BTB S/L shoulder abd 2x10 2# S/L shoulder ER 2x10 2# Supine horizontal abd 2x15 GTB Supine D2 flexion GTB x 10 each  [facility] Adult PT Treatment:                                                DATE: 02/26/2022 Therapeutic Exercise: UBE level 1 2/2 fwd and backward for warm up while taking subjective Row 2x10 17# Shoulder IR/ER 2x10 GTB Wall slide 2x10 R shoulder flexion Shoulder IR/ER RTB 2x10 each Manual Therapy: STM to bilateral cervical paraspinals, upper traps, suboccipitals  Modalities: Location:  right shoulder Time:  10 minutes Pressure:  low Temperature:  34 degrees  [facility] Adult PT Treatment:                                                DATE:  02/24/2022 Therapeutic Exercise: UBE level 1 2/2 fwd and backward for warm up while taking subjective Pulley's bilateral flexion x 2 min Row 2x10 17# Shoulder extension 2x10 17# Wall slide 2x10 R shoulder flexion Shoulder IR/ER RTB 2x10 each Supine horizontal abd 3x10 GTB Supine shoulder flexion with pball 2x15 S/L shoulder abd 2x10 R 2# S/L shoulder ER 2x10 2# Seated bilateral ER 2x15 GTB Modalities: Location:  right shoulder Time:  10 minutes Pressure:  low Temperature:  34 degrees  [facility] Adult PT Treatment:                                                DATE: 02/17/2022 Therapeutic Exercise: UBE level 1 2/2 fwd and backward for warm up while taking subjective/ Row 2x10 17# Shoulder extension 2x10 17# Wall slide 2x10 R shoulder flexion Seated bilateral ER 2x15 GTB  Seated horizontal abd 2x10 GTB R shoulder IR/ER RTB 3x10 Supine shoulder flexion AROM 2x10 - R S/L shoulder abd 2x10 R 2# Modalities: Location:  right shoulder Time:  10 minutes Pressure:  low Temperature:  34 degrees  [facility] Adult PT Treatment:                                                DATE: 02/13/2022 Therapeutic Exercise: UBE level 1 2/2 fwd and backward for warm up while taking subjective/ Row 2x10 17# Shoulder extension 2x10 17# Wall slide 2x10 R  shoulder flexion Seated bilateral ER 2x15 GTB  Seated horizontal abd 2x10 GTB R shoulder IR/ER RTB 3x10 Supine shoulder flexion AROM 2x10 - R S/L shoulder abd 2x10 R 2# Modalities: Location:  right shoulder Time:  10 minutes Pressure:  low Temperature:  34 degrees  [facility] Adult PT Treatment:                                                DATE: 02/11/2022 Therapeutic Exercise: UBE level 1 2/2 fwd and backward for warm up while taking subjective/ Row 2x10 17# Shoulder extension 2x10 17# Wall slide 2x10 R shoulder flexion Seated bilateral ER 2x15 GTB  Supine horizontal abd 2x10 GTB R shoulder IR/ER RTB 2x10 Supine shoulder flexion with ball 2x10 S/L  shoulder abd 2x10 R 2# Therapeutic Activity: Assessment of tests/measures, goals, and outcomes for progress note  [facility] Adult PT Treatment:                                                DATE: 02/06/2022 Therapeutic Exercise: UBE level 1 2/2 fwd and backward for warm up while taking subjective Shoulder flexion/scaption with pulleys x 1 min each Row 2x10 17# Shoulder extension 2x10 17# Wall walk x 5 R shoulder flexion Seated bilateral ER 2x10 RTB  Seated horizontal abd 2x10 RTB R shoulder IR/ER RTB 2x10 - 3" hold Supine shoulder flexion with ball 2x10 (minimally painful arc) S/L shoulder abd 2x10 R Manual Therapy: PROM R shoulder flexion Modalities: Location:  right shoulder Time:  10 minutes Pressure:  low Temperature:  34 degrees  PATIENT EDUCATION: Education details: continue HEP Person educated: Patient Education method: Explanation, Demonstration, and Handouts Education comprehension: verbalized understanding and returned demonstration   HOME EXERCISE PROGRAM: Access Code: [email or web address] URL: [email or web address] Date: 02/13/2022 Prepared by: Octavio Manns  Exercises - Supine Shoulder Flexion Extension AAROM with Dowel  - 1 x daily - 7 x weekly - 2 sets - 10 reps - Standing Shoulder Row with Anchored Resistance  - 1 x daily - 7 x weekly - 3 sets - 10 reps - blue band hold - Seated Scapular Retraction  - 1 x daily - 7 x weekly - 2 sets - 10 reps - Standing Isometric Shoulder Internal Rotation at Doorway  - 1 x daily - 7 x weekly - 2 sets - 10 reps - 3 sec hold - Standing Isometric Shoulder External Rotation with Doorway  - 1 x daily - 7 x weekly - 2 sets - 10 reps - 3 sec hold - Shoulder External Rotation with Anchored Resistance  - 1 x daily - 7 x weekly - 2 sets - 10 reps - red band hold - Shoulder Internal Rotation with Resistance  - 1 x daily - 7 x weekly - 2 sets - 10 reps - red band hold   ASSESSMENT:   CLINICAL IMPRESSION: ***           OBJECTIVE  IMPAIRMENTS: decreased activity tolerance, decreased ROM, decreased strength, impaired UE functional use, and pain.    ACTIVITY LIMITATIONS: carrying, lifting, continence, bathing, toileting, and reach over head   PARTICIPATION LIMITATIONS: driving, shopping, community activity, and yard work   PERSONAL FACTORS: Fitness and 3+ comorbidities: HTN, DM II, Depression  are also affecting patient's functional outcome.      GOALS: Goals reviewed with patient? No   SHORT TERM GOALS: Target date: 01/13/2022   Pt will be compliant and knowledgeable with initial HEP for improved comfort and carryover Baseline: initial HEP given  Goal status: MET   2.  Pt will self report right shoulder pain no greater than 6/10 for improved comfort and functional ability Baseline: 10/10 at worst Goal status: MET   LONG TERM GOALS: Target date: 03/17/2022   Pt will improve FOTO function score to no less than 50% as proxy for functional improvement Baseline: 4% function 02/11/2022: 42% function Goal status: ONGOING   2.  Pt will self report right shoulder pain no greater than 3/10 for improved comfort and functional ability Baseline: 10/10 at worst Goal status: ONGOING   3.  Pt will improve R shoulder flex/abd AROM to no less than 140 in order to improve functional ability with ADLs Baseline: see chart Goal status: ONGOING   4.  Pt will be able to reach overhead into cabinets not limited by pain for improved ADL performance Baseline: unable Goal status: ONGOING   PLAN:   PT FREQUENCY: 2x/week   PT DURATION: 12 weeks   PLANNED INTERVENTIONS: Therapeutic exercises, Therapeutic activity, Neuromuscular re-education, Balance training, Gait training, Patient/Family education, Self Care, Joint mobilization, Electrical stimulation, Cryotherapy, Moist heat, Vasopneumatic device, Manual therapy, and Re-evaluation   PLAN FOR NEXT SESSION: assess HEP response, PROM to R shoulder, vaso R shoulder,  strengthening and ROM R shoulder   Ward Chatters, PT 03/17/2022, 7:47 AM

## 2022-03-19 ENCOUNTER — Ambulatory Visit: Payer: 59

## 2022-03-19 DIAGNOSIS — M25511 Pain in right shoulder: Secondary | ICD-10-CM | POA: Diagnosis not present

## 2022-03-19 DIAGNOSIS — M6281 Muscle weakness (generalized): Secondary | ICD-10-CM

## 2022-03-19 NOTE — Therapy (Signed)
OUTPATIENT PHYSICAL THERAPY TREATMENT NOTE/DISCHARGE  PHYSICAL THERAPY DISCHARGE SUMMARY  Visits from Start of Care: 16  Current functional level related to goals / functional outcomes: See goals and objective   Remaining deficits: See goals and objective   Education / Equipment: HEP   Patient agrees to discharge. Patient goals were  mostly met . Patient is being discharged due to meeting the stated rehab goals.     Patient Name: Tracy Daugherty MRN: [id number] DOB:08/16/1960, 62 y.o., female Today's Date: 03/19/2022  PCP: Tito Dine, PA-C  REFERRING PROVIDER: Earlie Server, MD   END OF SESSION:   PT End of Session - 03/19/22 1444     Visit Number 16    Number of Visits 24    Date for PT Re-Evaluation 03/17/22    Authorization Type UHC Medicare/Medicaid    PT Start Time [id number]    PT Stop Time 1520    PT Time Calculation (min) 35 min    Activity Tolerance Patient tolerated treatment well;Patient limited by pain    Behavior During Therapy WFL for tasks assessed/performed                          Past Medical History:  Diagnosis Date   Arthritis    knees, ankles   Bronchitis    uses inhaler prn   Depression    Diabetes mellitus without complication ([place])    type 2   GERD (gastroesophageal reflux disease)    Headache    controlled with meds   Hepatitis 2014   Hep C - was treated   HLD (hyperlipidemia)    Hypertension    Hyperthyroidism    RLL pneumonia    x 1   Seasonal allergies    Sleep apnea    uses cpap nightly   Wears glasses    Wears partial dentures    upper   Past Surgical History:  Procedure Laterality Date   ABDOMINAL HYSTERECTOMY     ANTERIOR (CYSTOCELE) AND POSTERIOR REPAIR (RECTOCELE) WITH XENFORM GRAFT AND SACROSPINOUS FIXATION  2013   ANTERIOR CERVICAL DECOMPRESSION/DISCECTOMY FUSION 4 LEVELS N/A 04/02/2020   Procedure: CERVICAL THREE-FOUR, CERVICAL FOUR-FIVE, CERVICAL FIVE-SIX, CERVICAL SIX-SEVEN ANTERIOR CERVICAL  DECOMPRESSION/DISCECTOMY FUSION;  Surgeon: Kary Kos, MD;  Location: [facility];  Service: Neurosurgery;  Laterality: N/A;   CHOLECYSTECTOMY     COLONOSCOPY     right sided transverse abdominas plane block under ultrasound guidance  2018   TUBAL LIGATION     UPPER GI ENDOSCOPY     WISDOM TOOTH EXTRACTION  1980s   Patient Active Problem List   Diagnosis Date Noted   Spinal stenosis in cervical region 04/02/2020    REFERRING DIAG:  P/O RT SHLD SCOPE DISTAL CLAVICLECTOMY POSS OPEN ROTATOR CUFF REPAIR    THERAPY DIAG:  Acute pain of right shoulder  Muscle weakness (generalized)  Rationale for Evaluation and Treatment Rehabilitation  PERTINENT HISTORY: HTN, DM II, Depression   PRECAUTIONS: Shoulder  ONSET DATE: 12/18/2021   SUBJECTIVE:  SUBJECTIVE STATEMENT:  Pt presents to PT with reports of decreased shoulder pain. Has continued HEP compliant with no adverse effect.    PAIN:  Are you having pain?  Yes: NPRS scale: 7/10 Worst: 10/10 Pain location: R anterior shoulder  Pain description: sharp, post surgical Aggravating factors: lying down Relieving factors: positioning    OBJECTIVE: (objective measures completed at initial evaluation unless otherwise dated)   DIAGNOSTIC FINDINGS:  See imaging    PATIENT SURVEYS:  Oswestry: 74% - 37/50   COGNITION: Overall cognitive status: Within functional limits for tasks assessed                                  SENSATION: WFL   POSTURE: Rounded shoulders, fwd head, sling donned on R UE   UPPER EXTREMITY ROM:    Passive ROM Right eval Right 12/14 Right 02/11/22 AROM Right 03/19/18  Shoulder flexion 45  140 112 120  Shoulder extension        Shoulder abduction 30  95 145 150  Shoulder adduction        Shoulder internal rotation 40  80     Shoulder external rotation 10  60    Elbow flexion        Elbow extension        Wrist flexion        Wrist extension        Wrist ulnar deviation        Wrist radial deviation        Wrist pronation        Wrist supination        (Blank rows = not tested)   UPPER EXTREMITY MMT:   MMT Right eval Left eval  Shoulder flexion      Shoulder extension      Shoulder abduction      Shoulder adduction      Shoulder internal rotation      Shoulder external rotation      Middle trapezius      Lower trapezius      Elbow flexion      Elbow extension      Wrist flexion      Wrist extension      Wrist ulnar deviation      Wrist radial deviation      Wrist pronation      Wrist supination      Grip strength (lbs)      (Blank rows = not tested)   SHOULDER SPECIAL TESTS: Impingement tests: DNT SLAP lesions: DNT Instability tests: DNT Rotator cuff assessment: DNT Biceps assessment: DNT   JOINT MOBILITY TESTING:  DNT   PALPATION:  DNT             TREATMENT: [facility] Adult PT Treatment:                                                DATE: 03/19/2022 Therapeutic Exercise: UBE level 1 2/2 fwd and backward for warm up while taking subjective Standing row 2x10 BTB Standing IR/ER 2x15 RTB Standing wall slide x 10 R shoulder flexion S/L ER 2x10 2# S/L abd 2x10 2# Therapeutic Activity: Assessment of tests/measures, goals, and outcomes for discharge Modalities: Location:  right shoulder Time:  10 minutes Pressure:  low Temperature:  34 degrees  [facility] Adult PT Treatment:                                                DATE: 03/13/2022 Therapeutic Exercise: UBE level 1 2/2 fwd and backward for warm up while taking subjective Seated low row 3x10 20# Seated lat pulldown 2x10 20# Shoulder IR/ER 2x10 3# Supine horizontal abd 2x15 GTB Supine bilateral ER 2x10 GTB S/L shoulder abd x 10 2# R S/L shoulder ER 2x10 2# R Modalities: Location:  right shoulder Time:  10 minutes Pressure:   low Temperature:  34 degrees  [facility] Adult PT Treatment:                                                DATE: 03/11/2022 Therapeutic Exercise: UBE level 1 2/2 fwd and backward for warm up while taking subjective Seated low row 3x10 20# Seated lat pulldown 2x10 20# Shoulder IR/ER 2x15 GTB  Shoulder ext 2x15 BTB S/L shoulder abd 2x10 2# S/L shoulder ER 2x10 2# Supine horizontal abd 2x15 GTB Supine D2 flexion GTB x 10 each  [facility] Adult PT Treatment:                                                DATE: 02/26/2022 Therapeutic Exercise: UBE level 1 2/2 fwd and backward for warm up while taking subjective Row 2x10 17# Shoulder IR/ER 2x10 GTB Wall slide 2x10 R shoulder flexion Shoulder IR/ER RTB 2x10 each Manual Therapy: STM to bilateral cervical paraspinals, upper traps, suboccipitals  Modalities: Location:  right shoulder Time:  10 minutes Pressure:  low Temperature:  34 degrees  [facility] Adult PT Treatment:                                                DATE: 02/24/2022 Therapeutic Exercise: UBE level 1 2/2 fwd and backward for warm up while taking subjective Pulley's bilateral flexion x 2 min Row 2x10 17# Shoulder extension 2x10 17# Wall slide 2x10 R shoulder flexion Shoulder IR/ER RTB 2x10 each Supine horizontal abd 3x10 GTB Supine shoulder flexion with pball 2x15 S/L shoulder abd 2x10 R 2# S/L shoulder ER 2x10 2# Seated bilateral ER 2x15 GTB Modalities: Location:  right shoulder Time:  10 minutes Pressure:  low Temperature:  34 degrees  [facility] Adult PT Treatment:                                                DATE: 02/17/2022 Therapeutic Exercise: UBE level 1 2/2 fwd and backward for warm up while taking subjective/ Row 2x10 17# Shoulder extension 2x10 17# Wall slide 2x10 R shoulder flexion Seated bilateral ER 2x15 GTB  Seated horizontal abd 2x10 GTB R shoulder IR/ER RTB 3x10 Supine shoulder flexion AROM 2x10 - R S/L shoulder abd 2x10 R 2# Modalities: Location:  right  shoulder Time:  10 minutes Pressure:  low Temperature:  34 degrees  [facility] Adult PT Treatment:                                                DATE: 02/13/2022 Therapeutic Exercise: UBE level 1 2/2 fwd and backward for warm up while taking subjective/ Row 2x10 17# Shoulder extension 2x10 17# Wall slide 2x10 R shoulder flexion Seated bilateral ER 2x15 GTB  Seated horizontal abd 2x10 GTB R shoulder IR/ER RTB 3x10 Supine shoulder flexion AROM 2x10 - R S/L shoulder abd 2x10 R 2# Modalities: Location:  right shoulder Time:  10 minutes Pressure:  low Temperature:  34 degrees  [facility] Adult PT Treatment:                                                DATE: 02/11/2022 Therapeutic Exercise: UBE level 1 2/2 fwd and backward for warm up while taking subjective/ Row 2x10 17# Shoulder extension 2x10 17# Wall slide 2x10 R shoulder flexion Seated bilateral ER 2x15 GTB  Supine horizontal abd 2x10 GTB R shoulder IR/ER RTB 2x10 Supine shoulder flexion with ball 2x10 S/L shoulder abd 2x10 R 2# Therapeutic Activity: Assessment of tests/measures, goals, and outcomes for progress note  [facility] Adult PT Treatment:                                                DATE: 02/06/2022 Therapeutic Exercise: UBE level 1 2/2 fwd and backward for warm up while taking subjective Shoulder flexion/scaption with pulleys x 1 min each Row 2x10 17# Shoulder extension 2x10 17# Wall walk x 5 R shoulder flexion Seated bilateral ER 2x10 RTB  Seated horizontal abd 2x10 RTB R shoulder IR/ER RTB 2x10 - 3" hold Supine shoulder flexion with ball 2x10 (minimally painful arc) S/L shoulder abd 2x10 R Manual Therapy: PROM R shoulder flexion Modalities: Location:  right shoulder Time:  10 minutes Pressure:  low Temperature:  34 degrees  PATIENT EDUCATION: Education details: continue HEP Person educated: Patient Education method: Explanation, Demonstration, and Handouts Education comprehension: verbalized understanding and  returned demonstration   HOME EXERCISE PROGRAM: Access Code: [email or web address] URL: [email or web address] Date: 03/19/2022 Prepared by: Octavio Manns  Exercises - Standing Shoulder Row with Anchored Resistance  - 1 x daily - 7 x weekly - 3 sets - 10 reps - blue band hold - Shoulder External Rotation with Anchored Resistance  - 1 x daily - 7 x weekly - 2 sets - 15 reps - red band hold - Shoulder Internal Rotation with Resistance  - 1 x daily - 7 x weekly - 2 sets - 15 reps - red band hold - Shoulder Flexion Wall Slide with Towel  - 1 x daily - 7 x weekly - 2 sets - 10 reps - 5 sec hold - Sidelying Shoulder Abduction Palm Forward  - 1 x daily - 7 x weekly - 2 sets - 10 reps - 2lbs working to 4lbs hold - Sidelying Shoulder External Rotation  - 1 x daily - 7 x weekly - 3 sets - 10 reps - 2lbs  hold   ASSESSMENT:   CLINICAL IMPRESSION: Pt was able to complete all prescribed exercises and demonstrated knowledge of HEP with no adverse effect. Over the course of PT treatment she has progressed fairly well but has continued to be limited by pain. Her ROM and subjective functional ability increased, with the latter assessed via FOTO. She does have continued limited R shoulder flexion when painful but has good passive range. She should continue to improve with HEP compliance, PT indicated that she should continue to be compliant with HEP to keep strengthening. Pt in agreement with current plan and will continue be seen and progressed as able.         OBJECTIVE IMPAIRMENTS: decreased activity tolerance, decreased ROM, decreased strength, impaired UE functional use, and pain.    ACTIVITY LIMITATIONS: carrying, lifting, continence, bathing, toileting, and reach over head   PARTICIPATION LIMITATIONS: driving, shopping, community activity, and yard work   PERSONAL FACTORS: Fitness and 3+ comorbidities: HTN, DM II, Depression  are also affecting patient's functional outcome.      GOALS: Goals  reviewed with patient? No   SHORT TERM GOALS: Target date: 01/13/2022   Pt will be compliant and knowledgeable with initial HEP for improved comfort and carryover Baseline: initial HEP given  Goal status: MET   2.  Pt will self report right shoulder pain no greater than 6/10 for improved comfort and functional ability Baseline: 10/10 at worst Goal status: MET   LONG TERM GOALS: Target date: 03/17/2022   Pt will improve FOTO function score to no less than 50% as proxy for functional improvement Baseline: 4% function 02/11/2022: 42% function 03/19/2022: 46% function Goal status: MOSTLY MET   2.  Pt will self report right shoulder pain no greater than 3/10 for improved comfort and functional ability Baseline: 10/10 at worst Goal status: VARIABLE   3.  Pt will improve R shoulder flex/abd AROM to no less than 140 in order to improve functional ability with ADLs Baseline: see chart Goal status: PARTIALLY MET   4.  Pt will be able to reach overhead into cabinets not limited by pain for improved ADL performance Baseline: unable Goal status: MET   PLAN:   PT FREQUENCY: 2x/week   PT DURATION: 12 weeks   PLANNED INTERVENTIONS: Therapeutic exercises, Therapeutic activity, Neuromuscular re-education, Balance training, Gait training, Patient/Family education, Self Care, Joint mobilization, Electrical stimulation, Cryotherapy, Moist heat, Vasopneumatic device, Manual therapy, and Re-evaluation   PLAN FOR NEXT SESSION: assess HEP response, PROM to R shoulder, vaso R shoulder, strengthening and ROM R shoulder   Ward Chatters, PT 03/19/2022, 4:19 PM

## 2022-04-25 ENCOUNTER — Other Ambulatory Visit: Payer: Self-pay

## 2022-04-25 ENCOUNTER — Encounter (HOSPITAL_BASED_OUTPATIENT_CLINIC_OR_DEPARTMENT_OTHER): Payer: Self-pay | Admitting: Orthopaedic Surgery

## 2022-04-25 NOTE — Progress Notes (Signed)
   04/25/22 1304  PAT Phone Screen  Is the patient taking a GLP-1 receptor agonist? Yes  Has the patient been informed on holding medication? Yes  Do You Have Diabetes? Yes  Do You Have Hypertension? Yes  Have You Ever Been to the ER for Asthma? No  Have You Taken Oral Steroids in the Past 3 Months? No  Do you Take Phenteramine or any Other Diet Drugs? No  Recent  Lab Work, EKG, CXR? No  Do you have a history of heart problems? (S)  Yes (palpitations, HTN, OSA)  Cardiologist Name Dorris Carnes DNP cards [facility]  Have you ever had tests on your heart? Yes  What cardiac tests were performed? Echo;Stress Test  What date/year were cardiac tests completed? 01-2022 Zio patch, 04-16-21 Stress test-no ischemia, 03-29-21 ECHO EF 60-65%  Results viewable: Care Everywhere  Any Recent Hospitalizations? No  Height 5\' 8"  (1.727 m)  Weight 127 kg  Pat Appointment Scheduled (S)  Yes (BMP, EKG)

## 2022-04-28 ENCOUNTER — Encounter (HOSPITAL_BASED_OUTPATIENT_CLINIC_OR_DEPARTMENT_OTHER)
Admission: RE | Admit: 2022-04-28 | Discharge: 2022-04-28 | Disposition: A | Discharge status: Home / Self Care | Payer: 59 | Source: Ambulatory Visit | Attending: Orthopaedic Surgery | Admitting: Orthopaedic Surgery

## 2022-04-28 DIAGNOSIS — Z01818 Encounter for other preprocedural examination: Secondary | ICD-10-CM | POA: Diagnosis present

## 2022-04-28 LAB — BASIC METABOLIC PANEL
Anion gap: 10 (ref 5–15)
BUN: 7 mg/dL — ABNORMAL LOW (ref 8–23)
CO2: 24 mmol/L (ref 22–32)
Calcium: 9.3 mg/dL (ref 8.9–10.3)
Chloride: 103 mmol/L (ref 98–111)
Creatinine, Ser: 0.95 mg/dL (ref 0.44–1.00)
GFR, Estimated: >60 mL/min (ref >60)
Glucose, Bld: 369 mg/dL — ABNORMAL HIGH (ref 70–99)
Potassium: 4.8 mmol/L (ref 3.5–5.1)
Sodium: 137 mmol/L (ref 135–145)

## 2022-04-28 NOTE — Progress Notes (Addendum)
Surgical soap and BPO gel given to patient, instructions given, patient verbalized understanding.

## 2022-04-29 NOTE — Progress Notes (Signed)
Glucose- 369, Dr.W.Fitzgerald aware, will recheck CBG day of surgery.

## 2022-04-29 NOTE — H&P (Signed)
PREOPERATIVE H&P  Chief Complaint: left shoulder cartilage disorder, impingement syndrome, OA, bicep tendinitis, rotator cuff tear  HPI: Tracy Daugherty is a 62 y.o. female who is scheduled for, Procedure(s): SHOULDER ARTHROSCOPY WITH SUBACROMIAL DECOMPRESSION, ROTATOR CUFF REPAIR AND BICEP TENDON REPAIR SHOULDER ARTHROSCOPY WITH DISTAL CLAVICLE EXCISION.   Patient has a past medical history significant for CKD, GERD, DM, HTN, hypothyroidism, PONV, hep C, sleep apnea on CPAP.   She has had consistent pain in the left shoulder.  She has tried and failed conservative measures.   Symptoms are rated as moderate to severe, and have been worsening.  This is significantly impairing activities of daily living.    Please see clinic note for further details on this patient's care.    She has elected for surgical management.   Past Medical History:  Diagnosis Date   Arthritis    knees, ankles   Bronchitis    uses inhaler prn   Chronic kidney disease    HTN   Chronic pain    Depression    Diabetes mellitus without complication    type 2   GERD (gastroesophageal reflux disease)    Headache    controlled with meds   Hepatitis 2014   Hep C - was treated   HLD (hyperlipidemia)    Hypertension    Hyperthyroidism    PONV (postoperative nausea and vomiting)    RLL pneumonia    x 1   Seasonal allergies    Sleep apnea    uses cpap nightly   Wears glasses    Wears partial dentures    upper   Past Surgical History:  Procedure Laterality Date   ABDOMINAL HYSTERECTOMY     ANTERIOR (CYSTOCELE) AND POSTERIOR REPAIR (RECTOCELE) WITH XENFORM GRAFT AND SACROSPINOUS FIXATION  2013   ANTERIOR CERVICAL DECOMPRESSION/DISCECTOMY FUSION 4 LEVELS N/A 04/02/2020   Procedure: CERVICAL THREE-FOUR, CERVICAL FOUR-FIVE, CERVICAL FIVE-SIX, CERVICAL SIX-SEVEN ANTERIOR CERVICAL DECOMPRESSION/DISCECTOMY FUSION;  Surgeon: Donalee Citrinram, Gary, MD;  [facility];  Service: Neurosurgery;  Laterality: N/A;    CHOLECYSTECTOMY     COLONOSCOPY     right sided transverse abdominas plane block under ultrasound guidance  2018   TUBAL LIGATION     UPPER GI ENDOSCOPY     WISDOM TOOTH EXTRACTION  1980s   Social History   Socioeconomic History   Marital status: Single    Spouse name: Not on file   Number of children: Not on file   Years of education: Not on file   Highest education level: Not on file  Occupational History   Not on file  Tobacco Use   Smoking status: Never   Smokeless tobacco: Never  Vaping Use   Vaping Use: Never used  Substance and Sexual Activity   Alcohol use: Yes    Comment: rare   Drug use: Not Currently   Sexual activity: Yes    Birth control/protection: Surgical    Comment: Hysterectomy  Other Topics Concern   Not on file  Social History Narrative   Not on file   Social Determinants of Health   Financial Resource Strain: Not on file  Food Insecurity: Not on file  Transportation Needs: Not on file  Physical Activity: Not on file  Stress: Not on file  Social Connections: Not on file   History reviewed. No pertinent family history. Allergies  Allergen Reactions   Gabapentin Itching   Meloxicam Itching   Other Itching    tomatoes   Prior to Admission medications  Medication Sig Start Date End Date Taking? Authorizing Provider  albuterol (VENTOLIN HFA) 108 (90 Base) MCG/ACT inhaler Inhale 2 puffs into the lungs every 6 (six) hours as needed for wheezing or shortness of breath.   Yes [provider]  amLODipine (NORVASC) 10 MG tablet Take 10 mg by mouth daily.   Yes [provider]  aspirin EC 81 MG tablet Take 81 mg by mouth daily. Swallow whole.   Yes [provider]  buPROPion (WELLBUTRIN SR) 150 MG 12 hr tablet Take 150 mg by mouth 2 (two) times daily.   Yes [provider]  celecoxib (CELEBREX) 200 MG capsule Take 200 mg by mouth 2 (two) times daily.   Yes [provider]  chlorthalidone (HYGROTON) 25 MG  tablet Take 25 mg by mouth daily.   Yes [provider]  Cholecalciferol (VITAMIN D) 50 MCG (2000 UT) tablet Take 4,000 Units by mouth daily.   Yes [provider]  famotidine (PEPCID) 20 MG tablet Take 20 mg by mouth at bedtime.   Yes [provider]  fluticasone (FLONASE) 50 MCG/ACT nasal spray Place 2 sprays into both nostrils daily as needed for allergies or rhinitis.   Yes [provider]  furosemide (LASIX) 40 MG tablet Take 40 mg by mouth.   Yes [provider]  HYDROcodone-acetaminophen (NORCO) 7.5-325 MG tablet Take 1 tablet by mouth every 6 (six) hours as needed for moderate pain.   Yes [provider]  losartan (COZAAR) 100 MG tablet Take 100 mg by mouth daily.   Yes [provider]  metFORMIN (GLUCOPHAGE-XR) 750 MG 24 hr tablet Take 1,500 mg by mouth daily with breakfast.   Yes [provider]  methocarbamol (ROBAXIN) 500 MG tablet Take 1 tablet (500 mg total) by mouth 4 (four) times daily. 04/03/20  Yes Meyran, Tiana Loft, NP  Nebivolol HCl 20 MG TABS Take 40 mg by mouth daily.   Yes [provider]  omeprazole (PRILOSEC) 40 MG capsule Take 40 mg by mouth daily.   Yes [provider]  pregabalin (LYRICA) 75 MG capsule Take 75 mg by mouth 2 (two) times daily.   Yes [provider]  promethazine (PHENERGAN) 12.5 MG tablet Take 12.5 mg by mouth every 8 (eight) hours as needed for nausea or vomiting.   Yes [provider]  rOPINIRole (REQUIP) 0.25 MG tablet Take 0.75 mg by mouth at bedtime.   Yes [provider]  rosuvastatin (CRESTOR) 10 MG tablet Take 10 mg by mouth daily.   Yes [provider]  Semaglutide, 1 MG/DOSE, (OZEMPIC, 1 MG/DOSE,) 2 MG/1.5ML SOPN Inject 1 mg into the skin every Wednesday.   Yes [provider]  sertraline (ZOLOFT) 100 MG tablet Take 100 mg by mouth daily.   Yes [provider]  sertraline (ZOLOFT) 50 MG tablet Take  50 mg by mouth daily.   Yes [provider]  tiZANidine (ZANAFLEX) 4 MG tablet Take 2-4 mg by mouth every 8 (eight) hours as needed for muscle spasms.   Yes [provider]    ROS: All other systems have been reviewed and were otherwise negative with the exception of those mentioned in the HPI and as above.  Physical Exam: General: Alert, no acute distress Cardiovascular: No pedal edema Respiratory: No cyanosis, no use of accessory musculature GI: No organomegaly, abdomen is soft and non-tender Skin: No lesions in the area of chief complaint Neurologic: Sensation intact distally Psychiatric: Patient is competent for consent with  normal mood and affect Lymphatic: No axillary or cervical lymphadenopathy  MUSCULOSKELETAL:  Examination is difficult to perform secondary to patient pain. She has weakness with supraspinatus and infraspinatus testing. Positive impingement. Positive O'Brien's.  Positive AC tenderness to palpation.  Imaging: MRI is reviewed and demonstrates a near full thickness supraspinatous tear. There is a SLAP tear. Significant edema in the distal clavicle. Type II acromion.   Assessment: left shoulder cartilage disorder, impingement syndrome, OA, bicep tendinitis, rotator cuff tear  Plan: Plan for Procedure(s): SHOULDER ARTHROSCOPY WITH SUBACROMIAL DECOMPRESSION, ROTATOR CUFF REPAIR AND BICEP TENDON REPAIR SHOULDER ARTHROSCOPY WITH DISTAL CLAVICLE EXCISION  The risks benefits and alternatives were discussed with the patient including but not limited to the risks of nonoperative treatment, versus surgical intervention including infection, bleeding, nerve injury,  blood clots, cardiopulmonary complications, morbidity, mortality, among others, and they were willing to proceed.   The patient acknowledged the explanation, agreed to proceed with the plan and consent was signed.   Patient's glucose levels 369 on 4/8. Anesthesiologist aware. Will recheck labs  day of surgery. Patient aware if still very elevated when rechecked surgery may get cancelled.   Operative Plan: Left shoulder scope with SAD, DCE, BT, RCR Discharge Medications: standard - we will manage post-op pain meds for first four days then she will resume standard pain medications DVT Prophylaxis: none Physical Therapy: outpatient PT Special Discharge needs: Sling. IceMan   Vernetta Honey, PA-C  04/29/2022 7:29 PM

## 2022-04-30 NOTE — Discharge Instructions (Signed)
Ramond Marrow MD, MPH Alfonse Alpers, PA-C [facility] [address] [phone] (tel)   [phone] (fax)   POST-OPERATIVE INSTRUCTIONS - SHOULDER ARTHROSCOPY  WOUND CARE You may remove the Operative Dressing on Post-Op Day #3 (72hrs after surgery).   Alternatively if you would like you can leave dressing on until follow-up if within 7-8 days but keep it dry. Leave steri-strips in place until they fall off on their own, usually 2 weeks postop. There may be a small amount of fluid/bleeding leaking at the surgical site.  This is normal; the shoulder is filled with fluid during the procedure and can leak for 24-48hrs after surgery.  You may change/reinforce the bandage as needed.  Use the Cryocuff or Ice as often as possible for the first 7 days, then as needed for pain relief. Always keep a towel, ACE wrap or other barrier between the cooling unit and your skin.  You may shower on Post-Op Day #3. Gently pat the area dry.  Do not soak the shoulder in water or submerge it.  Keep incisions as dry as possible. Do not go swimming in the pool or ocean until 4 weeks after surgery or when otherwise instructed.    EXERCISES Wear the sling at all times  You may remove the sling for showering, but keep the arm across the chest or in a secondary sling.     It is normal for your fingers/hand to become more swollen after surgery and discolored from bruising.   This will resolve over the first few weeks usually after surgery. Please continue to ambulate and do not stay sitting or lying for too long.  Perform foot and wrist pumps to assist in circulation.  PHYSICAL THERAPY - You will begin physical therapy soon after surgery (unless otherwise specified) - Please call to set up an appointment, if you do not already have one  - Let our office if there are any issues with scheduling your therapy  - You have a physical therapy appointment scheduled at [facility] (across  the hall from our office) on April 15 at 11 am   REGIONAL ANESTHESIA (NERVE BLOCKS) The anesthesia team may have performed a nerve block for you this is a great tool used to minimize pain.   The block may start wearing off overnight (between 8-24 hours postop) When the block wears off, your pain may go from nearly zero to the pain you would have had postop without the block. This is an abrupt transition but nothing dangerous is happening.   This can be a challenging period but utilize your as needed pain medications to try and manage this period. We suggest you use the pain medication the first night prior to going to bed, to ease this transition.  You may take an extra dose of narcotic when this happens if needed  POST-OP MEDICATIONS- Multimodal approach to pain control In general your pain will be controlled with a combination of substances.  Prescriptions unless otherwise discussed are electronically sent to your pharmacy.  This is a carefully made plan we use to minimize narcotic use.     Celebrex - Anti-inflammatory medication taken on a scheduled basis Acetaminophen - Non-narcotic pain medicine taken on a scheduled basis  Oxycodone - This is a strong narcotic, to be used only on an "as needed" basis for SEVERE pain. You will use this medication for 4-5 days post-operatively Then you will restart your standard every day pain medication Zofran - take  as needed for nausea  FOLLOW-UP If you develop a Fever (?101.5), Redness or Drainage from the surgical incision site, please call our office to arrange for an evaluation. Please call the office to schedule a follow-up appointment for your first post-operative appointment, 7-10 days post-operatively.    HELPFUL INFORMATION   You may be more comfortable sleeping in a semi-seated position the first few nights following surgery.  Keep a pillow propped under the elbow and forearm for comfort.  If you have a recliner type of chair it might  be beneficial.  If not that is fine too, but it would be helpful to sleep propped up with pillows behind your operated shoulder as well under your elbow and forearm.  This will reduce pulling on the suture lines.  When dressing, put your operative arm in the sleeve first.  When getting undressed, take your operative arm out last.  Loose fitting, button-down shirts are recommended.  Often in the first days after surgery you may be more comfortable keeping your operative arm under your shirt and not through the sleeve.  You may return to work/school in the next couple of days when you feel up to it.  Desk work and typing in the sling is fine.  We suggest you use the pain medication the first night prior to going to bed, in order to ease any pain when the anesthesia wears off. You should avoid taking pain medications on an empty stomach as it will make you nauseous.  You should wean off your narcotic medicines as soon as you are able.  Most patients will be off or using minimal narcotics before their first postop appointment.   Do not drink alcoholic beverages or take illicit drugs when taking pain medications.  It is against the law to drive while taking narcotics.  In some states it is against the law to drive while your arm is in a sling.   Pain medication may make you constipated.  Below are a few solutions to try in this order: Decrease the amount of pain medication if you aren't having pain. Drink lots of decaffeinated fluids. Drink prune juice and/or eat dried prunes  If the first 3 don't work start with additional solutions Take Colace - an over-the-counter stool softener Take Senokot - an over-the-counter laxative Take Miralax - a stronger over-the-counter laxative  For more information including helpful videos and documents visit our website:   [email or web address]

## 2022-05-01 ENCOUNTER — Ambulatory Visit (HOSPITAL_BASED_OUTPATIENT_CLINIC_OR_DEPARTMENT_OTHER): Payer: 59 | Admitting: Certified Registered"

## 2022-05-01 ENCOUNTER — Emergency Department (HOSPITAL_COMMUNITY)
Admission: EM | Admit: 2022-05-01 | Discharge: 2022-05-01 | Disposition: A | Discharge status: Home / Self Care | Payer: 59 | Source: Home / Self Care | Attending: Emergency Medicine | Admitting: Emergency Medicine

## 2022-05-01 ENCOUNTER — Encounter (HOSPITAL_BASED_OUTPATIENT_CLINIC_OR_DEPARTMENT_OTHER)
Admission: RE | Disposition: A | Discharge status: Home / Self Care | Payer: Self-pay | Source: Home / Self Care | Attending: Orthopaedic Surgery

## 2022-05-01 ENCOUNTER — Encounter (HOSPITAL_COMMUNITY): Payer: Self-pay

## 2022-05-01 ENCOUNTER — Ambulatory Visit (HOSPITAL_BASED_OUTPATIENT_CLINIC_OR_DEPARTMENT_OTHER)
Admission: RE | Admit: 2022-05-01 | Discharge: 2022-05-01 | Disposition: A | Discharge status: Home / Self Care | Payer: 59 | Attending: Orthopaedic Surgery | Admitting: Orthopaedic Surgery

## 2022-05-01 DIAGNOSIS — G4730 Sleep apnea, unspecified: Secondary | ICD-10-CM | POA: Diagnosis not present

## 2022-05-01 DIAGNOSIS — Z794 Long term (current) use of insulin: Secondary | ICD-10-CM | POA: Insufficient documentation

## 2022-05-01 DIAGNOSIS — I129 Hypertensive chronic kidney disease with stage 1 through stage 4 chronic kidney disease, or unspecified chronic kidney disease: Secondary | ICD-10-CM | POA: Insufficient documentation

## 2022-05-01 DIAGNOSIS — Z6841 Body Mass Index (BMI) 40.0 and over, adult: Secondary | ICD-10-CM | POA: Diagnosis not present

## 2022-05-01 DIAGNOSIS — S46012A Strain of muscle(s) and tendon(s) of the rotator cuff of left shoulder, initial encounter: Secondary | ICD-10-CM | POA: Diagnosis not present

## 2022-05-01 DIAGNOSIS — Z8619 Personal history of other infectious and parasitic diseases: Secondary | ICD-10-CM | POA: Insufficient documentation

## 2022-05-01 DIAGNOSIS — Z538 Procedure and treatment not carried out for other reasons: Secondary | ICD-10-CM | POA: Insufficient documentation

## 2022-05-01 DIAGNOSIS — M7542 Impingement syndrome of left shoulder: Secondary | ICD-10-CM | POA: Diagnosis not present

## 2022-05-01 DIAGNOSIS — E785 Hyperlipidemia, unspecified: Secondary | ICD-10-CM | POA: Diagnosis not present

## 2022-05-01 DIAGNOSIS — Z7984 Long term (current) use of oral hypoglycemic drugs: Secondary | ICD-10-CM | POA: Insufficient documentation

## 2022-05-01 DIAGNOSIS — N189 Chronic kidney disease, unspecified: Secondary | ICD-10-CM | POA: Insufficient documentation

## 2022-05-01 DIAGNOSIS — Z7982 Long term (current) use of aspirin: Secondary | ICD-10-CM | POA: Insufficient documentation

## 2022-05-01 DIAGNOSIS — E1165 Type 2 diabetes mellitus with hyperglycemia: Secondary | ICD-10-CM | POA: Insufficient documentation

## 2022-05-01 DIAGNOSIS — M19012 Primary osteoarthritis, left shoulder: Secondary | ICD-10-CM | POA: Diagnosis not present

## 2022-05-01 DIAGNOSIS — Z79899 Other long term (current) drug therapy: Secondary | ICD-10-CM | POA: Insufficient documentation

## 2022-05-01 DIAGNOSIS — X58XXXA Exposure to other specified factors, initial encounter: Secondary | ICD-10-CM | POA: Diagnosis not present

## 2022-05-01 DIAGNOSIS — I10 Essential (primary) hypertension: Secondary | ICD-10-CM

## 2022-05-01 DIAGNOSIS — E1122 Type 2 diabetes mellitus with diabetic chronic kidney disease: Secondary | ICD-10-CM | POA: Insufficient documentation

## 2022-05-01 DIAGNOSIS — R739 Hyperglycemia, unspecified: Secondary | ICD-10-CM

## 2022-05-01 DIAGNOSIS — K219 Gastro-esophageal reflux disease without esophagitis: Secondary | ICD-10-CM | POA: Diagnosis not present

## 2022-05-01 HISTORY — DX: Chronic kidney disease, unspecified: N18.9

## 2022-05-01 HISTORY — DX: Other specified postprocedural states: Z98.890

## 2022-05-01 HISTORY — DX: Other chronic pain: G89.29

## 2022-05-01 HISTORY — DX: Other specified postprocedural states: R11.2

## 2022-05-01 LAB — COMPREHENSIVE METABOLIC PANEL
ALT: 16 U/L (ref 0–44)
AST: 23 U/L (ref 15–41)
Albumin: 4 g/dL (ref 3.5–5.0)
Alkaline Phosphatase: 56 U/L (ref 38–126)
Anion gap: 11 (ref 5–15)
BUN: 14 mg/dL (ref 8–23)
CO2: 22 mmol/L (ref 22–32)
Calcium: 8.9 mg/dL (ref 8.9–10.3)
Chloride: 99 mmol/L (ref 98–111)
Creatinine, Ser: 1.11 mg/dL — ABNORMAL HIGH (ref 0.44–1.00)
GFR, Estimated: 57 mL/min — ABNORMAL LOW (ref >60)
Glucose, Bld: 435 mg/dL — ABNORMAL HIGH (ref 70–99)
Potassium: 4.6 mmol/L (ref 3.5–5.1)
Sodium: 132 mmol/L — ABNORMAL LOW (ref 135–145)
Total Bilirubin: 1 mg/dL (ref 0.3–1.2)
Total Protein: 7.4 g/dL (ref 6.5–8.1)

## 2022-05-01 LAB — CBC WITH DIFFERENTIAL/PLATELET
Abs Immature Granulocytes: 0 10*3/uL (ref 0.00–0.07)
Basophils Absolute: 0 10*3/uL (ref 0.0–0.1)
Basophils Relative: 1 %
Eosinophils Absolute: 0.1 10*3/uL (ref 0.0–0.5)
Eosinophils Relative: 2 %
HCT: 40.1 % (ref 36.0–46.0)
Hemoglobin: 13.5 g/dL (ref 12.0–15.0)
Immature Granulocytes: 0 %
Lymphocytes Relative: 43 %
Lymphs Abs: 2.1 10*3/uL (ref 0.7–4.0)
MCH: 29.2 pg (ref 26.0–34.0)
MCHC: 33.7 g/dL (ref 30.0–36.0)
MCV: 86.8 fL (ref 80.0–100.0)
Monocytes Absolute: 0.4 10*3/uL (ref 0.1–1.0)
Monocytes Relative: 9 %
Neutro Abs: 2.1 10*3/uL (ref 1.7–7.7)
Neutrophils Relative %: 45 %
Platelets: 201 10*3/uL (ref 150–400)
RBC: 4.62 MIL/uL (ref 3.87–5.11)
RDW: 13.9 % (ref 11.5–15.5)
WBC: 4.7 10*3/uL (ref 4.0–10.5)
nRBC: 0 % (ref 0.0–0.2)

## 2022-05-01 LAB — CBG MONITORING, ED
Glucose-Capillary: 443 mg/dL — ABNORMAL HIGH (ref 70–99)
Glucose-Capillary: 350 mg/dL — ABNORMAL HIGH (ref 70–99)

## 2022-05-01 LAB — BLOOD GAS, VENOUS
Acid-base deficit: 0.8 mmol/L (ref 0.0–2.0)
Bicarbonate: 23.5 mmol/L (ref 20.0–28.0)
O2 Saturation: 87.2 %
Patient temperature: 37
pCO2, Ven: 37 mmHg — ABNORMAL LOW (ref 44–60)
pH, Ven: 7.41 (ref 7.25–7.43)
pO2, Ven: 51 mmHg — ABNORMAL HIGH (ref 32–45)

## 2022-05-01 LAB — BETA-HYDROXYBUTYRIC ACID: Beta-Hydroxybutyric Acid: 0.35 mmol/L — ABNORMAL HIGH (ref 0.05–0.27)

## 2022-05-01 LAB — GLUCOSE, CAPILLARY: Glucose-Capillary: 457 mg/dL — ABNORMAL HIGH (ref 70–99)

## 2022-05-01 SURGERY — CANCELLED PROCEDURE

## 2022-05-01 MED ORDER — MIDAZOLAM HCL 2 MG/2ML IJ SOLN
INTRAMUSCULAR | Status: AC
  Filled 2022-05-01: qty 2

## 2022-05-01 MED ORDER — ACETAMINOPHEN 500 MG PO TABS: 1000.0000 mg | ORAL_TABLET | Freq: Once | ORAL | Status: DC

## 2022-05-01 MED ORDER — FENTANYL CITRATE (PF) 100 MCG/2ML IJ SOLN
INTRAMUSCULAR | Status: AC
  Filled 2022-05-01: qty 2

## 2022-05-01 MED ORDER — CEFAZOLIN SODIUM-DEXTROSE 2-4 GM/100ML-% IV SOLN
INTRAVENOUS | Status: AC
  Filled 2022-05-01: qty 100

## 2022-05-01 MED ORDER — TRANEXAMIC ACID-NACL 1000-0.7 MG/100ML-% IV SOLN: 1000.0000 mg | INTRAVENOUS | Status: DC

## 2022-05-01 MED ORDER — TRANEXAMIC ACID-NACL 1000-0.7 MG/100ML-% IV SOLN
INTRAVENOUS | Status: AC
  Filled 2022-05-01: qty 100

## 2022-05-01 MED ORDER — ACETAMINOPHEN 500 MG PO TABS
ORAL_TABLET | ORAL | Status: AC
  Filled 2022-05-01: qty 2

## 2022-05-01 MED ORDER — LACTATED RINGERS IV BOLUS
1000.0000 mL | Freq: Once | INTRAVENOUS | Status: AC
  Administered 2022-05-01: 1000 mL via INTRAVENOUS

## 2022-05-01 MED ORDER — CEFAZOLIN IN SODIUM CHLORIDE 3-0.9 GM/100ML-% IV SOLN
INTRAVENOUS | Status: AC
  Filled 2022-05-01: qty 100

## 2022-05-01 MED ORDER — CEFAZOLIN IN SODIUM CHLORIDE 3-0.9 GM/100ML-% IV SOLN: 3.0000 g | INTRAVENOUS | Status: DC

## 2022-05-01 MED ORDER — LACTATED RINGERS IV SOLN: INTRAVENOUS | Status: DC

## 2022-05-01 MED ORDER — INSULIN ASPART 100 UNIT/ML IJ SOLN
10.0000 [IU] | Freq: Once | INTRAMUSCULAR | Status: AC
  Administered 2022-05-01: 10 [IU] via SUBCUTANEOUS
  Filled 2022-05-01: qty 0.1

## 2022-05-01 SURGICAL SUPPLY — 51 items
AID PSTN UNV HD RSTRNT DISP (MISCELLANEOUS)
APL PRP STRL LF DISP 70% ISPRP (MISCELLANEOUS) ×3
BLADE EXCALIBUR 4.0X13 (MISCELLANEOUS) ×1 IMPLANT
BURR OVAL 8 FLU 4.0X13 (MISCELLANEOUS) ×1 IMPLANT
CANNULA 5.75X71 LONG (CANNULA) IMPLANT
CANNULA PASSPORT 5 (CANNULA) IMPLANT
CANNULA PASSPORT BUTTON 10-40 (CANNULA) IMPLANT
CANNULA TWIST IN 8.25X7CM (CANNULA) IMPLANT
CHLORAPREP W/TINT 26 (MISCELLANEOUS) ×3 IMPLANT
CLSR STERI-STRIP ANTIMIC 1/2X4 (GAUZE/BANDAGES/DRESSINGS) ×3 IMPLANT
COOLER ICEMAN CLASSIC (MISCELLANEOUS) ×1 IMPLANT
DRAPE IMP U-DRAPE 54X76 (DRAPES) ×3 IMPLANT
DRAPE INCISE IOBAN 66X45 STRL (DRAPES) IMPLANT
DRAPE SHOULDER BEACH CHAIR (DRAPES) ×3 IMPLANT
DW OUTFLOW CASSETTE/TUBE SET (MISCELLANEOUS) ×1 IMPLANT
GAUZE PAD ABD 8X10 STRL (GAUZE/BANDAGES/DRESSINGS) ×3 IMPLANT
GAUZE SPONGE 4X4 12PLY STRL (GAUZE/BANDAGES/DRESSINGS) ×3 IMPLANT
GLOVE BIO SURGEON STRL SZ 6.5 (GLOVE) ×3 IMPLANT
GLOVE BIOGEL PI IND STRL 6.5 (GLOVE) ×3 IMPLANT
GLOVE BIOGEL PI IND STRL 8 (GLOVE) ×3 IMPLANT
GLOVE ECLIPSE 8.0 STRL XLNG CF (GLOVE) ×3 IMPLANT
GOWN STRL REUS W/ TWL LRG LVL3 (GOWN DISPOSABLE) ×6 IMPLANT
GOWN STRL REUS W/TWL LRG LVL3 (GOWN DISPOSABLE) ×6
GOWN STRL REUS W/TWL XL LVL3 (GOWN DISPOSABLE) ×3 IMPLANT
KIT STABILIZATION SHOULDER (MISCELLANEOUS) ×1 IMPLANT
KIT STR SPEAR 1.8 FBRTK DISP (KITS) IMPLANT
LASSO CRESCENT QUICKPASS (SUTURE) IMPLANT
MANIFOLD NEPTUNE II (INSTRUMENTS) ×3 IMPLANT
NDL HD SCORPION MEGA LOADER (NEEDLE) IMPLANT
NDL SAFETY ECLIP 18X1.5 (MISCELLANEOUS) ×3 IMPLANT
PACK ARTHROSCOPY DSU (CUSTOM PROCEDURE TRAY) ×3 IMPLANT
PACK BASIN DAY SURGERY FS (CUSTOM PROCEDURE TRAY) ×3 IMPLANT
PAD COLD SHLDR WRAP-ON (PAD) ×1 IMPLANT
PORT APPOLLO RF 90DEGREE MULTI (SURGICAL WAND) ×1 IMPLANT
RESTRAINT HEAD UNIVERSAL NS (MISCELLANEOUS) ×1 IMPLANT
SHEET MEDIUM DRAPE 40X70 STRL (DRAPES) ×3 IMPLANT
SLEEVE SCD COMPRESS KNEE MED (STOCKING) ×3 IMPLANT
SLING ARM FOAM STRAP LRG (SOFTGOODS) IMPLANT
SUT FIBERWIRE #2 38 T-5 BLUE (SUTURE)
SUT MNCRL AB 4-0 PS2 18 (SUTURE) ×1 IMPLANT
SUT PDS AB 1 CT  36 (SUTURE)
SUT PDS AB 1 CT 36 (SUTURE) IMPLANT
SUT TIGER TAPE 7 IN WHITE (SUTURE) IMPLANT
SUTURE FIBERWR #2 38 T-5 BLUE (SUTURE) IMPLANT
SUTURE TAPE TIGERLINK 1.3MM BL (SUTURE) IMPLANT
SUTURETAPE TIGERLINK 1.3MM BL (SUTURE)
SYR 5ML LL (SYRINGE) ×3 IMPLANT
TAPE FIBER 2MM 7IN #2 BLUE (SUTURE) IMPLANT
TOWEL GREEN STERILE FF (TOWEL DISPOSABLE) ×6 IMPLANT
TUBE CONNECTING 20X1/4 (TUBING) ×3 IMPLANT
TUBING ARTHROSCOPY IRRIG 16FT (MISCELLANEOUS) ×1 IMPLANT

## 2022-05-01 NOTE — Discharge Instructions (Signed)
You were seen today in the emergency department due to hyperglycemia.  Your x-ray was reassuring, continue taking your insulin as prescribed, call your orthopedic surgeon and maybe hold insulin the night before the surgery but take it leading up to it so you do not end up hyperglycemic.  Return to the ED if you have any new or worsening symptoms.

## 2022-05-01 NOTE — Anesthesia Preprocedure Evaluation (Signed)
Anesthesia Evaluation  Patient identified by MRN, date of birth, ID band Patient awake    Reviewed: Allergy & Precautions, NPO status , Patient's Chart, lab work & pertinent test results  History of Anesthesia Complications (+) PONV and history of anesthetic complications  Airway Mallampati: II  TM Distance: >3 FB Neck ROM: Full    Dental  (+) Dental Advisory Given   Pulmonary sleep apnea and Continuous Positive Airway Pressure Ventilation    breath sounds clear to auscultation       Cardiovascular hypertension, Pt. on medications and Pt. on home beta blockers  Rhythm:Regular Rate:Normal     Neuro/Psych negative neurological ROS     GI/Hepatic ,GERD  Medicated,,(+) Hepatitis -, C  Endo/Other  diabetes, Type 2, Oral Hypoglycemic Agents  Morbid obesity  Renal/GU CRFRenal disease     Musculoskeletal  (+) Arthritis ,    Abdominal   Peds  Hematology negative hematology ROS (+)   Anesthesia Other Findings   Reproductive/Obstetrics                             Anesthesia Physical Anesthesia Plan  ASA: 3  Anesthesia Plan: General   Post-op Pain Management: Regional block* and Tylenol PO (pre-op)*   Induction: Intravenous  PONV Risk Score and Plan: 4 or greater and Dexamethasone, Ondansetron, Midazolam and Treatment may vary due to age or medical condition  Airway Management Planned: Oral ETT  Additional Equipment: None  Intra-op Plan:   Post-operative Plan: Extubation in OR  Informed Consent: I have reviewed the patients History and Physical, chart, labs and discussed the procedure including the risks, benefits and alternatives for the proposed anesthesia with the patient or authorized representative who has indicated his/her understanding and acceptance.     Dental advisory given  Plan Discussed with: CRNA  Anesthesia Plan Comments:        Anesthesia Quick Evaluation

## 2022-05-01 NOTE — ED Triage Notes (Signed)
Pt sent over from day surgery at [facility]. Sugar was over 400 there. Pt has not had her insulin for 1 week due to this surgery as instructed. Pt c/o some lightheadedness.

## 2022-05-01 NOTE — ED Provider Notes (Signed)
[place] EMERGENCY DEPARTMENT AT [facility] Provider Note   CSN: [id number] Arrival date & time: 05/01/22  1227     History  Chief Complaint  Patient presents with   Hyperglycemia    Tracy Daugherty is a 62 y.o. female.   Hyperglycemia    Patient with medical history of CKD, GERD, hypertension, lipidemia, type 2 diabetes on insulin presents to the emergency department due to hyperglycemia.  Patient states about a week ago she stopped taking her insulin in preparation for total replacement today.  Prior to surgery she was hyperglycemic, sent to ED for further evaluation.  She endorses polyuria and polydipsia, no abdominal pain but does feel nauseated without vomiting.  She is taking all of her other medications other than the insulin.  Home Medications Prior to Admission medications   Medication Sig Start Date End Date Taking? Authorizing Provider  albuterol (VENTOLIN HFA) 108 (90 Base) MCG/ACT inhaler Inhale 2 puffs into the lungs every 6 (six) hours as needed for wheezing or shortness of breath.    [provider]  amLODipine (NORVASC) 10 MG tablet Take 10 mg by mouth daily.    [provider]  aspirin EC 81 MG tablet Take 81 mg by mouth daily. Swallow whole.    [provider]  buPROPion (WELLBUTRIN SR) 150 MG 12 hr tablet Take 150 mg by mouth 2 (two) times daily.    [provider]  celecoxib (CELEBREX) 200 MG capsule Take 200 mg by mouth 2 (two) times daily.    [provider]  chlorthalidone (HYGROTON) 25 MG tablet Take 25 mg by mouth daily.    [provider]  Cholecalciferol (VITAMIN D) 50 MCG (2000 UT) tablet Take 4,000 Units by mouth daily.    [provider]  famotidine (PEPCID) 20 MG tablet Take 20 mg by mouth at bedtime.    [provider]  fluticasone (FLONASE) 50 MCG/ACT nasal spray Place 2 sprays into both nostrils daily as needed for allergies or rhinitis.    [provider]  furosemide (LASIX) 40 MG tablet Take 40 mg by mouth.    [provider]  HYDROcodone-acetaminophen (NORCO) 7.5-325 MG tablet Take 1 tablet by mouth every 6 (six) hours as needed for moderate pain.    [provider]  losartan (COZAAR) 100 MG tablet Take 100 mg by mouth daily.    [provider]  metFORMIN (GLUCOPHAGE-XR) 750 MG 24 hr tablet Take 1,500 mg by mouth daily with breakfast.    [provider]  methocarbamol (ROBAXIN) 500 MG tablet Take 1 tablet (500 mg total) by mouth 4 (four) times daily. 04/03/20   Meyran, Tiana Loft, NP  Nebivolol HCl 20 MG TABS Take 40 mg by mouth daily.    [provider]  omeprazole (PRILOSEC) 40 MG capsule Take 40 mg by mouth daily.    [provider]  pregabalin (LYRICA) 75 MG capsule Take 75 mg by mouth 2 (two) times daily.    [provider]  promethazine (PHENERGAN) 12.5 MG tablet Take 12.5 mg by mouth every 8 (eight) hours as needed for nausea or vomiting.    [provider]  rOPINIRole (REQUIP) 0.25 MG tablet Take 0.75 mg by mouth at bedtime.    [provider]  rosuvastatin (CRESTOR) 10 MG tablet Take 10 mg by mouth daily.    [provider]  Semaglutide, 1 MG/DOSE, (OZEMPIC, 1 MG/DOSE,) 2 MG/1.5ML SOPN Inject 1 mg into the skin every Wednesday.  [provider]  sertraline (ZOLOFT) 100 MG tablet Take 100 mg by mouth daily.    [provider]  sertraline (ZOLOFT) 50 MG tablet Take 50 mg by mouth daily.    [provider]  tiZANidine (ZANAFLEX) 4 MG tablet Take 2-4 mg by mouth every 8 (eight) hours as needed for muscle spasms.    [provider]      Allergies    Gabapentin, Meloxicam, and Other    Review of Systems   Review of Systems  Physical Exam Updated Vital Signs BP (!) 145/73 (BP Location: Left Arm)   Pulse 72   Temp 97.9 F (36.6 C) (Oral)   Resp (!) 23   Ht 5\' 8"  (1.727 m)   Wt 127 kg   LMP  (LMP  Unknown)   SpO2 96%   BMI 42.57 kg/m  Physical Exam Vitals and nursing note reviewed. Exam conducted with a chaperone present.  Constitutional:      Appearance: Normal appearance. She is obese.  HENT:     Head: Normocephalic and atraumatic.  Eyes:     General: No scleral icterus.       Right eye: No discharge.        Left eye: No discharge.     Extraocular Movements: Extraocular movements intact.     Pupils: Pupils are equal, round, and reactive to light.  Cardiovascular:     Rate and Rhythm: Normal rate and regular rhythm.     Pulses: Normal pulses.     Heart sounds: Normal heart sounds. No murmur heard.    No friction rub. No gallop.  Pulmonary:     Effort: Pulmonary effort is normal. No respiratory distress.     Breath sounds: Normal breath sounds.  Abdominal:     General: Abdomen is flat. Bowel sounds are normal. There is no distension.     Palpations: Abdomen is soft.     Tenderness: There is no abdominal tenderness.     Comments: Abdomen is nontender  Skin:    General: Skin is warm and dry.     Coloration: Skin is not jaundiced.  Neurological:     Mental Status: She is alert. Mental status is at baseline.     Coordination: Coordination normal.     ED Results / Procedures / Treatments   Labs (all labs ordered are listed, but only abnormal results are displayed) Labs Reviewed  COMPREHENSIVE METABOLIC PANEL - Abnormal; Notable for the following components:      Result Value   Sodium 132 (*)    Glucose, Bld 435 (*)    Creatinine, Ser 1.11 (*)    GFR, Estimated 57 (*)    All other components within normal limits  BETA-HYDROXYBUTYRIC ACID - Abnormal; Notable for the following components:   Beta-Hydroxybutyric Acid 0.35 (*)    All other components within normal limits  BLOOD GAS, VENOUS - Abnormal; Notable for the following components:   pCO2, Ven 37 (*)    pO2, Ven 51 (*)    All other components within normal limits  CBG MONITORING, ED - Abnormal; Notable for  the following components:   Glucose-Capillary 443 (*)    All other components within normal limits  CBG MONITORING, ED - Abnormal; Notable for the following components:   Glucose-Capillary 350 (*)    All other components within normal limits  CBC WITH DIFFERENTIAL/PLATELET    EKG None  Radiology No results found.  Procedures Procedures    Medications Ordered in ED  Medications  lactated ringers bolus 1,000 mL (0 mLs Intravenous Stopped 05/01/22 1420)  insulin aspart (novoLOG) injection 10 Units (10 Units Subcutaneous Given 05/01/22 1432)    ED Course/ Medical Decision Making/ A&P                             Medical Decision Making Amount and/or Complexity of Data Reviewed Labs: ordered.  Risk Prescription drug management.   Patient presents to the emergency department due to hyperglycemia.  Differential includes but not limited to DKA, HHS, electrolyte derangement, AKI, dehydration, arrhythmia.  History is provided the patient, also by patient's family member who is at bedside.  Also viewed external medical records including note from preoperative visit today.  Start with laboratory workup, fluids and await potassium level before empirically starting on insulin.  She is hyperglycemic during initial CBG greater than 400.  EKG shows sinus rhythm without any specific changes.  She is in sinus rhythm on cardiac monitoring.  There is no gross electrolyte derangement, AKI or transaminitis.  No cytosis or anemia.  VBG is without any acidosis, beta hydroxybutyric acid is slightly elevated but no evidence of DKA or HHS reassuringly.  I reevaluated the patient, given 10 units of insulin, after LR patient's repeat CBG is 350.  She is feeling improved, asymptomatic.  Given no evidence of DKA, HHS, emergent process I do feel she is appropriate for close outpatient follow-up.  Discussed with patient the need to stay on the insulin and to revisit preoperative instructions with orthopedic  surgeon.        Final Clinical Impression(s) / ED Diagnoses Final diagnoses:  Hyperglycemia    Rx / DC Orders ED Discharge Orders     None         Theron Arista, Cordelia Poche 05/01/22 1505    Linwood Dibbles, MD 05/02/22 [phone]

## 2022-05-01 NOTE — Interval H&P Note (Signed)
All questions answered, patient wants to proceed with procedure. ? ?

## 2022-05-01 NOTE — ED Notes (Signed)
Pt discharged home. Discussed discharge information. No s/s observed during discharge.

## 2022-05-01 NOTE — Progress Notes (Signed)
Dr. Marzella Schlein. Sampson Goon canceled surgery today for CBG of 457.  Pt instructed to go home and take her medications, and follow up with her PCP today.  Dr. Everardo Pacific also stated his PA would follow up with her today to develop a plan moving forward with rescheduling surgery.  RN instructed pt if she starts feeling symptomatic to go straight to the emergency room.  Pt verbalized understanding.

## 2022-05-03 ENCOUNTER — Emergency Department (HOSPITAL_COMMUNITY)
Admission: EM | Admit: 2022-05-03 | Discharge: 2022-05-03 | Disposition: A | Discharge status: Home / Self Care | Payer: 59 | Attending: Emergency Medicine | Admitting: Emergency Medicine

## 2022-05-03 ENCOUNTER — Encounter (HOSPITAL_COMMUNITY): Payer: Self-pay

## 2022-05-03 DIAGNOSIS — R739 Hyperglycemia, unspecified: Secondary | ICD-10-CM

## 2022-05-03 DIAGNOSIS — E1165 Type 2 diabetes mellitus with hyperglycemia: Secondary | ICD-10-CM | POA: Diagnosis not present

## 2022-05-03 LAB — BASIC METABOLIC PANEL
Anion gap: 10 (ref 5–15)
BUN: 13 mg/dL (ref 8–23)
CO2: 21 mmol/L — ABNORMAL LOW (ref 22–32)
Calcium: 9.2 mg/dL (ref 8.9–10.3)
Chloride: 104 mmol/L (ref 98–111)
Creatinine, Ser: 0.99 mg/dL (ref 0.44–1.00)
GFR, Estimated: >60 mL/min (ref >60)
Glucose, Bld: 414 mg/dL — ABNORMAL HIGH (ref 70–99)
Potassium: 4.2 mmol/L (ref 3.5–5.1)
Sodium: 135 mmol/L (ref 135–145)

## 2022-05-03 LAB — URINALYSIS, ROUTINE W REFLEX MICROSCOPIC
Bacteria, UA: NONE SEEN
Bilirubin Urine: NEGATIVE
Glucose, UA: >=500 mg/dL — AB
Hgb urine dipstick: NEGATIVE
Ketones, ur: NEGATIVE mg/dL
Leukocytes,Ua: NEGATIVE
Nitrite: NEGATIVE
Protein, ur: NEGATIVE mg/dL
Specific Gravity, Urine: 1.032 — ABNORMAL HIGH (ref 1.005–1.030)
pH: 5 (ref 5.0–8.0)

## 2022-05-03 LAB — CBG MONITORING, ED
Glucose-Capillary: 407 mg/dL — ABNORMAL HIGH (ref 70–99)
Glucose-Capillary: 308 mg/dL — ABNORMAL HIGH (ref 70–99)
Glucose-Capillary: 297 mg/dL — ABNORMAL HIGH (ref 70–99)

## 2022-05-03 LAB — CBC
HCT: 42.5 % (ref 36.0–46.0)
Hemoglobin: 14.5 g/dL (ref 12.0–15.0)
MCH: 29.7 pg (ref 26.0–34.0)
MCHC: 34.1 g/dL (ref 30.0–36.0)
MCV: 87.1 fL (ref 80.0–100.0)
Platelets: 203 10*3/uL (ref 150–400)
RBC: 4.88 MIL/uL (ref 3.87–5.11)
RDW: 13.5 % (ref 11.5–15.5)
WBC: 4.6 10*3/uL (ref 4.0–10.5)
nRBC: 0 % (ref 0.0–0.2)

## 2022-05-03 MED ORDER — INSULIN ASPART 100 UNIT/ML IJ SOLN
12.0000 [IU] | Freq: Once | INTRAMUSCULAR | Status: AC
  Administered 2022-05-03: 12 [IU] via SUBCUTANEOUS
  Filled 2022-05-03: qty 0.12

## 2022-05-03 MED ORDER — LACTATED RINGERS IV BOLUS
1500.0000 mL | Freq: Once | INTRAVENOUS | Status: AC
  Administered 2022-05-03: 1500 mL via INTRAVENOUS

## 2022-05-03 MED ORDER — LACTATED RINGERS IV SOLN: INTRAVENOUS | Status: DC

## 2022-05-03 MED ORDER — LACTATED RINGERS IV BOLUS: 1000.0000 mL | Freq: Once | INTRAVENOUS | Status: DC

## 2022-05-03 NOTE — ED Triage Notes (Signed)
Pt presents with c/o hyperglycemia that started today when she began to feel dizzy. Pt reports her CBG was 452 at home.

## 2022-05-03 NOTE — ED Provider Triage Note (Signed)
Emergency Medicine Provider Triage Evaluation Note  Tracy Daugherty , a 63 y.o. female  was evaluated in triage.  Pt complains of hyperglycemia.  She was previously on insulin and metformin, currently on Ozempic.  She was supposed to have a surgery earlier in the week but was found to be hyperglycemic.  She was sent to the ED where her hyperglycemia was treated.  Currently blood sugars are continuing to run in the 400s.  She denies vomiting or abdominal pain.  She endorses increased thirst and urination.  Review of Systems  Positive: Polydipsia, polyuria Negative: Vomiting, abdominal pain  Physical Exam  BP (!) 124/95   Pulse 73   Temp 98.9 F (37.2 C) (Oral)   Resp 18   LMP  (LMP Unknown)   SpO2 100%  Gen:   Awake, no distress   Resp:  Normal effort  MSK:   Moves extremities without difficulty  Other:    Medical Decision Making  Medically screening exam initiated at 7:04 PM.  Appropriate orders placed.  CERES AILLS was informed that the remainder of the evaluation will be completed by another provider, this initial triage assessment does not replace that evaluation, and the importance of remaining in the ED until their evaluation is complete.     Renne Crigler, PA-C 05/03/22 1905

## 2022-05-03 NOTE — ED Provider Notes (Signed)
[place] EMERGENCY DEPARTMENT AT [facility] Provider Note   CSN: [id number] Arrival date & time: 05/03/22  1837     History  Chief Complaint  Patient presents with   Hyperglycemia    Tracy Daugherty is a 62 y.o. female.  62 year old female presents with high blood sugars at home.  Seen here recently for same.  Notes compliance with her diabetic medications as well as a diabetic diet.  Has had some polyuria and polydipsia.  Denies any vomiting.  Recently stopped her Ozempic because she was scheduled to have surgery.  Sugars at home have run into the 400s.  Denies any abdominal pain       Home Medications Prior to Admission medications   Medication Sig Start Date End Date Taking? Authorizing Provider  albuterol (VENTOLIN HFA) 108 (90 Base) MCG/ACT inhaler Inhale 2 puffs into the lungs every 6 (six) hours as needed for wheezing or shortness of breath.    [provider]  amLODipine (NORVASC) 10 MG tablet Take 10 mg by mouth daily.    [provider]  aspirin EC 81 MG tablet Take 81 mg by mouth daily. Swallow whole.    [provider]  buPROPion (WELLBUTRIN SR) 150 MG 12 hr tablet Take 150 mg by mouth 2 (two) times daily.    [provider]  celecoxib (CELEBREX) 200 MG capsule Take 200 mg by mouth 2 (two) times daily.    [provider]  chlorthalidone (HYGROTON) 25 MG tablet Take 25 mg by mouth daily.    [provider]  Cholecalciferol (VITAMIN D) 50 MCG (2000 UT) tablet Take 4,000 Units by mouth daily.    [provider]  famotidine (PEPCID) 20 MG tablet Take 20 mg by mouth at bedtime.    [provider]  fluticasone (FLONASE) 50 MCG/ACT nasal spray Place 2 sprays into both nostrils daily as needed for allergies or rhinitis.    [provider]  furosemide (LASIX) 40 MG tablet Take 40 mg by mouth.    [provider]  HYDROcodone-acetaminophen (NORCO) 7.5-325 MG tablet Take 1  tablet by mouth every 6 (six) hours as needed for moderate pain.    [provider]  losartan (COZAAR) 100 MG tablet Take 100 mg by mouth daily.    [provider]  metFORMIN (GLUCOPHAGE-XR) 750 MG 24 hr tablet Take 1,500 mg by mouth daily with breakfast.    [provider]  methocarbamol (ROBAXIN) 500 MG tablet Take 1 tablet (500 mg total) by mouth 4 (four) times daily. 04/03/20   Meyran, Tiana Loft, NP  Nebivolol HCl 20 MG TABS Take 40 mg by mouth daily.    [provider]  omeprazole (PRILOSEC) 40 MG capsule Take 40 mg by mouth daily.    [provider]  pregabalin (LYRICA) 75 MG capsule Take 75 mg by mouth 2 (two) times daily.    [provider]  promethazine (PHENERGAN) 12.5 MG tablet Take 12.5 mg by mouth every 8 (eight) hours as needed for nausea or vomiting.    [provider]  rOPINIRole (REQUIP) 0.25 MG tablet Take 0.75 mg by mouth at bedtime.    [provider]  rosuvastatin (CRESTOR) 10 MG tablet Take 10 mg by mouth daily.    [provider]  Semaglutide, 1 MG/DOSE, (OZEMPIC, 1 MG/DOSE,) 2 MG/1.5ML SOPN Inject 1 mg into the skin every Wednesday.    [provider]  sertraline (ZOLOFT) 100 MG tablet Take 100 mg by  mouth daily.    [provider]  sertraline (ZOLOFT) 50 MG tablet Take 50 mg by mouth daily.    [provider]  tiZANidine (ZANAFLEX) 4 MG tablet Take 2-4 mg by mouth every 8 (eight) hours as needed for muscle spasms.    [provider]      Allergies    Gabapentin, Meloxicam, and Other    Review of Systems   Review of Systems  All other systems reviewed and are negative.   Physical Exam Updated Vital Signs BP (!) 124/95   Pulse 73   Temp 98.9 F (37.2 C) (Oral)   Resp 18   LMP  (LMP Unknown)   SpO2 100%  Physical Exam Vitals and nursing note reviewed.  Constitutional:      General: She is not in acute distress.    Appearance: Normal  appearance. She is well-developed. She is not toxic-appearing.  HENT:     Head: Normocephalic and atraumatic.  Eyes:     General: Lids are normal.     Conjunctiva/sclera: Conjunctivae normal.     Pupils: Pupils are equal, round, and reactive to light.  Neck:     Thyroid: No thyroid mass.     Trachea: No tracheal deviation.  Cardiovascular:     Rate and Rhythm: Normal rate and regular rhythm.     Heart sounds: Normal heart sounds. No murmur heard.    No gallop.  Pulmonary:     Effort: Pulmonary effort is normal. No respiratory distress.     Breath sounds: Normal breath sounds. No stridor. No decreased breath sounds, wheezing, rhonchi or rales.  Abdominal:     General: There is no distension.     Palpations: Abdomen is soft.     Tenderness: There is no abdominal tenderness. There is no rebound.  Musculoskeletal:        General: No tenderness. Normal range of motion.     Cervical back: Normal range of motion and neck supple.  Skin:    General: Skin is warm and dry.     Findings: No abrasion or rash.  Neurological:     Mental Status: She is alert and oriented to person, place, and time. Mental status is at baseline.     GCS: GCS eye subscore is 4. GCS verbal subscore is 5. GCS motor subscore is 6.     Cranial Nerves: No cranial nerve deficit.     Sensory: No sensory deficit.     Motor: Motor function is intact.  Psychiatric:        Attention and Perception: Attention normal.        Speech: Speech normal.        Behavior: Behavior normal.     ED Results / Procedures / Treatments   Labs (all labs ordered are listed, but only abnormal results are displayed) Labs Reviewed  BASIC METABOLIC PANEL - Abnormal; Notable for the following components:      Result Value   CO2 21 (*)    Glucose, Bld 414 (*)    All other components within normal limits  URINALYSIS, ROUTINE W REFLEX MICROSCOPIC - Abnormal; Notable for the following components:   Specific Gravity, Urine 1.032 (*)     Glucose, UA >=500 (*)    All other components within normal limits  CBG MONITORING, ED - Abnormal; Notable for the following components:   Glucose-Capillary 407 (*)    All other components within normal limits  CBC    EKG None  Radiology  No results found.  Procedures Procedures    Medications Ordered in ED Medications  lactated ringers infusion (has no administration in time range)  insulin aspart (novoLOG) injection 12 Units (has no administration in time range)  lactated ringers bolus 1,500 mL (has no administration in time range)    ED Course/ Medical Decision Making/ A&P                             Medical Decision Making Amount and/or Complexity of Data Reviewed Labs: ordered.  Risk Prescription drug management.  Patient here complaint of hyperglycemia.  No evidence of DKA here.  Was hyperglycemic here however anion gap is normal.  Given IV fluids and insulin and blood sugar has improved.  Will be discharged with follow-up with her doctor         Final Clinical Impression(s) / ED Diagnoses Final diagnoses:  None    Rx / DC Orders ED Discharge Orders     None         Lorre Nick, MD 05/03/22 2301

## 2022-09-24 ENCOUNTER — Encounter (HOSPITAL_BASED_OUTPATIENT_CLINIC_OR_DEPARTMENT_OTHER): Payer: Self-pay | Admitting: Orthopaedic Surgery

## 2022-09-24 ENCOUNTER — Other Ambulatory Visit: Payer: Self-pay

## 2022-09-25 ENCOUNTER — Encounter (HOSPITAL_BASED_OUTPATIENT_CLINIC_OR_DEPARTMENT_OTHER)
Admission: RE | Admit: 2022-09-25 | Discharge: 2022-09-25 | Disposition: A | Discharge status: Home / Self Care | Payer: 59 | Source: Ambulatory Visit | Attending: Orthopaedic Surgery | Admitting: Orthopaedic Surgery

## 2022-09-25 DIAGNOSIS — Z01812 Encounter for preprocedural laboratory examination: Secondary | ICD-10-CM | POA: Insufficient documentation

## 2022-09-25 LAB — BASIC METABOLIC PANEL
Anion gap: 9 (ref 5–15)
BUN: 13 mg/dL (ref 8–23)
CO2: 28 mmol/L (ref 22–32)
Calcium: 9.2 mg/dL (ref 8.9–10.3)
Chloride: 101 mmol/L (ref 98–111)
Creatinine, Ser: 1 mg/dL (ref 0.44–1.00)
GFR, Estimated: >60 mL/min (ref >60)
Glucose, Bld: 151 mg/dL — ABNORMAL HIGH (ref 70–99)
Potassium: 4 mmol/L (ref 3.5–5.1)
Sodium: 138 mmol/L (ref 135–145)

## 2022-09-30 NOTE — Discharge Instructions (Signed)
Ramond Marrow MD, MPH Alfonse Alpers, PA-C [facility] [address] [phone] (tel)   [phone] (fax)   POST-OPERATIVE INSTRUCTIONS - SHOULDER ARTHROSCOPY  WOUND CARE You may remove the Operative Dressing on Post-Op Day #3 (72hrs after surgery).   Alternatively if you would like you can leave dressing on until follow-up if within 7-8 days but keep it dry. Leave steri-strips in place until they fall off on their own, usually 2 weeks postop. There may be a small amount of fluid/bleeding leaking at the surgical site.  This is normal; the shoulder is filled with fluid during the procedure and can leak for 24-48hrs after surgery.  You may change/reinforce the bandage as needed.  Use the Cryocuff or Ice as often as possible for the first 7 days, then as needed for pain relief. Always keep a towel, ACE wrap or other barrier between the cooling unit and your skin.  You may shower on Post-Op Day #3. Gently pat the area dry.  Do not soak the shoulder in water or submerge it.  Keep incisions as dry as possible. Do not go swimming in the pool or ocean until 4 weeks after surgery or when otherwise instructed.    EXERCISES Wear the sling at all times  You may remove the sling for showering, but keep the arm across the chest or in a secondary sling.     It is normal for your fingers/hand to become more swollen after surgery and discolored from bruising.   This will resolve over the first few weeks usually after surgery. Please continue to ambulate and do not stay sitting or lying for too long.  Perform foot and wrist pumps to assist in circulation.  PHYSICAL THERAPY - You will begin physical therapy soon after surgery (unless otherwise specified) - Please call to set up an appointment, if you do not already have one  - Let our office if there are any issues with scheduling your therapy  - You have a physical therapy appointment scheduled at [facility] (across  the hall from our office) on 9/16  REGIONAL ANESTHESIA (NERVE BLOCKS) The anesthesia team may have performed a nerve block for you this is a great tool used to minimize pain.   The block may start wearing off overnight (between 8-24 hours postop) When the block wears off, your pain may go from nearly zero to the pain you would have had postop without the block. This is an abrupt transition but nothing dangerous is happening.   This can be a challenging period but utilize your as needed pain medications to try and manage this period. We suggest you use the pain medication the first night prior to going to bed, to ease this transition.  You may take an extra dose of narcotic when this happens if needed  POST-OP MEDICATIONS- Multimodal approach to pain control In general your pain will be controlled with a combination of substances.  Prescriptions unless otherwise discussed are electronically sent to your pharmacy.  This is a carefully made plan we use to minimize narcotic use.     Celebrex - Anti-inflammatory medication taken on a scheduled basis Acetaminophen - Non-narcotic pain medicine taken on a scheduled basis  Oxycodone - This is a strong narcotic, to be used only on an "as needed" basis for SEVERE pain. Zofran - take as needed for nausea   FOLLOW-UP If you develop a Fever (?101.5), Redness or Drainage from the surgical incision site, please call our  office to arrange for an evaluation. Please call the office to schedule a follow-up appointment for your first post-operative appointment, 7-10 days post-operatively.    HELPFUL INFORMATION   You may be more comfortable sleeping in a semi-seated position the first few nights following surgery.  Keep a pillow propped under the elbow and forearm for comfort.  If you have a recliner type of chair it might be beneficial.  If not that is fine too, but it would be helpful to sleep propped up with pillows behind your operated shoulder as well  under your elbow and forearm.  This will reduce pulling on the suture lines.  When dressing, put your operative arm in the sleeve first.  When getting undressed, take your operative arm out last.  Loose fitting, button-down shirts are recommended.  Often in the first days after surgery you may be more comfortable keeping your operative arm under your shirt and not through the sleeve.  You may return to work/school in the next couple of days when you feel up to it.  Desk work and typing in the sling is fine.  We suggest you use the pain medication the first night prior to going to bed, in order to ease any pain when the anesthesia wears off. You should avoid taking pain medications on an empty stomach as it will make you nauseous.  You should wean off your narcotic medicines as soon as you are able.  Most patients will be off narcotics before their first postop appointment.   Do not drink alcoholic beverages or take illicit drugs when taking pain medications.  It is against the law to drive while taking narcotics.  In some states it is against the law to drive while your arm is in a sling.   Pain medication may make you constipated.  Below are a few solutions to try in this order: Decrease the amount of pain medication if you aren't having pain. Drink lots of decaffeinated fluids. Drink prune juice and/or eat dried prunes  If the first 3 don't work start with additional solutions Take Colace - an over-the-counter stool softener Take Senokot - an over-the-counter laxative Take Miralax - a stronger over-the-counter laxative  For more information including helpful videos and documents visit our website:   [email or web address]

## 2022-09-30 NOTE — H&P (Signed)
PREOPERATIVE H&P  Chief Complaint: LEFT SHOULDER ROTATOR CUFF TEAR  HPI: Tracy Daugherty is a 62 y.o. female who is scheduled for, Procedure(s): SHOULDER ARTHROSCOPY WITH ROTATOR CUFF REPAIR,SUBACROMIAL DECOMPRESSION, DISTAL CLAVICLECTOMY, BICEPS TENODESIS.   Patient has a past medical history significant for DM, GERD, HLD, HTN, PONV.   Patient has had left shoulder pain for quite some time. She has tried injections with minimal improvement.   Symptoms are rated as moderate to severe, and have been worsening.  This is significantly impairing activities of daily living.    Please see clinic note for further details on this patient's care.    She has elected for surgical management.   Past Medical History:  Diagnosis Date   Arthritis    knees, ankles   Bronchitis    uses inhaler prn   Chronic kidney disease    HTN   Chronic pain    Depression    Diabetes mellitus without complication (HCC)    type 2   GERD (gastroesophageal reflux disease)    Headache    controlled with meds   Hepatitis 2014   Hep C - was treated   HLD (hyperlipidemia)    Hypertension    Hyperthyroidism    PONV (postoperative nausea and vomiting)    RLL pneumonia    x 1   Seasonal allergies    Sleep apnea    uses cpap nightly   Wears glasses    Wears partial dentures    upper   Past Surgical History:  Procedure Laterality Date   ABDOMINAL HYSTERECTOMY     ANTERIOR (CYSTOCELE) AND POSTERIOR REPAIR (RECTOCELE) WITH XENFORM GRAFT AND SACROSPINOUS FIXATION  2013   ANTERIOR CERVICAL DECOMPRESSION/DISCECTOMY FUSION 4 LEVELS N/A 04/02/2020   Procedure: CERVICAL THREE-FOUR, CERVICAL FOUR-FIVE, CERVICAL FIVE-SIX, CERVICAL SIX-SEVEN ANTERIOR CERVICAL DECOMPRESSION/DISCECTOMY FUSION;  Surgeon: Donalee Citrin, MD;  Location: [facility] OR;  Service: Neurosurgery;  Laterality: N/A;   CHOLECYSTECTOMY     COLONOSCOPY     right sided transverse abdominas plane block under ultrasound guidance  2018   TUBAL LIGATION      UPPER GI ENDOSCOPY     WISDOM TOOTH EXTRACTION  1980s   Social History   Socioeconomic History   Marital status: Single    Spouse name: Not on file   Number of children: Not on file   Years of education: Not on file   Highest education level: Not on file  Occupational History   Not on file  Tobacco Use   Smoking status: Never   Smokeless tobacco: Never  Vaping Use   Vaping status: Never Used  Substance and Sexual Activity   Alcohol use: Yes    Comment: rare   Drug use: Not Currently   Sexual activity: Yes    Birth control/protection: Surgical    Comment: Hysterectomy  Other Topics Concern   Not on file  Social History Narrative   Not on file   Social Determinants of Health   Financial Resource Strain: Medium Risk (02/11/2019)   [facility] from [facility] visits prior to 03/22/2022., [facility] visits prior to 03/22/2022.   Overall Financial Resource Strain (CARDIA)    Difficulty of Paying Living Expenses: Somewhat hard  Food Insecurity: Medium Risk (01/27/2022)   [facility]   Hunger Vital Sign    Worried About Running Out of Food in the Last Year: Sometimes true    Within the past 12 months, the food  you bought just didn't last and you didn't have money to get more: Not on file  Transportation Needs: No Transportation Needs (01/27/2022)   [facility]   Transportation    In the past 12 months, has lack of reliable transportation kept you from medical appointments, meetings, work or from getting things needed for daily living? : No  Physical Activity: Not on file  Stress: Not on file  Social Connections: Not on file   History reviewed. No pertinent family history. Allergies  Allergen Reactions   Bee Venom Dermatitis   Gabapentin Itching   Meloxicam Itching   Other Diarrhea    tomatoes   Tomato Itching   Prior to Admission medications   Medication Sig Start  Date End Date Taking? Authorizing Provider  albuterol (VENTOLIN HFA) 108 (90 Base) MCG/ACT inhaler Inhale 2 puffs into the lungs every 6 (six) hours as needed for wheezing or shortness of breath.   Yes [provider]  amLODipine (NORVASC) 10 MG tablet Take 10 mg by mouth daily.   Yes [provider]  aspirin EC 81 MG tablet Take 81 mg by mouth daily. Swallow whole.   Yes [provider]  buPROPion (WELLBUTRIN SR) 150 MG 12 hr tablet Take 150 mg by mouth 2 (two) times daily.   Yes [provider]  celecoxib (CELEBREX) 200 MG capsule Take 200 mg by mouth 2 (two) times daily.   Yes [provider]  chlorthalidone (HYGROTON) 25 MG tablet Take 25 mg by mouth daily.   Yes [provider]  Cholecalciferol (VITAMIN D) 50 MCG (2000 UT) tablet Take 4,000 Units by mouth daily.   Yes [provider]  fluticasone (FLONASE) 50 MCG/ACT nasal spray Place 2 sprays into both nostrils daily as needed for allergies or rhinitis.   Yes [provider]  furosemide (LASIX) 40 MG tablet Take 40 mg by mouth daily.   Yes [provider]  losartan (COZAAR) 100 MG tablet Take 100 mg by mouth daily.   Yes [provider]  metFORMIN (GLUCOPHAGE-XR) 750 MG 24 hr tablet Take 1,500 mg by mouth daily with breakfast.   Yes [provider]  methocarbamol (ROBAXIN) 500 MG tablet Take 1 tablet (500 mg total) by mouth 4 (four) times daily. 04/03/20  Yes Meyran, Tiana Loft, NP  Nebivolol HCl 20 MG TABS Take 40 mg by mouth daily.   Yes [provider]  omeprazole (PRILOSEC) 40 MG capsule Take 40 mg by mouth daily.   Yes [provider]  pregabalin (LYRICA) 75 MG capsule Take 75 mg by mouth 2 (two) times daily.   Yes [provider]  rOPINIRole (REQUIP) 0.25 MG tablet Take 0.75 mg by mouth at bedtime.   Yes [provider]  rosuvastatin (CRESTOR) 10 MG tablet Take 10 mg by mouth daily.   Yes [provider]  Semaglutide, 1 MG/DOSE, (OZEMPIC, 1 MG/DOSE,) 2 MG/1.5ML SOPN Inject 1 mg into the skin every Wednesday.   Yes [provider]  sertraline (ZOLOFT) 100 MG tablet Take 100 mg by mouth daily.   Yes [provider]  tiZANidine (ZANAFLEX) 4 MG tablet Take 2-4 mg by mouth every 8 (eight) hours as needed for muscle spasms.   Yes [provider]  famotidine (PEPCID) 20 MG tablet Take 20 mg by mouth at bedtime.    [provider]  HYDROcodone-acetaminophen (NORCO) 7.5-325 MG tablet Take 1 tablet by mouth every 6 (six) hours as needed for  moderate pain.    [provider]  promethazine (PHENERGAN) 12.5 MG tablet Take 12.5 mg by mouth every 8 (eight) hours as needed for nausea or vomiting.    [provider]  sertraline (ZOLOFT) 50 MG tablet Take 50 mg by mouth daily.    [provider]    ROS: All other systems have been reviewed and were otherwise negative with the exception of those mentioned in the HPI and as above.  Physical Exam: General: Alert, no acute distress Cardiovascular: No pedal edema Respiratory: No cyanosis, no use of accessory musculature GI: No organomegaly, abdomen is soft and non-tender Skin: No lesions in the area of chief complaint Neurologic: Sensation intact distally Psychiatric: Patient is competent for consent with normal mood and affect Lymphatic: No axillary or cervical lymphadenopathy  MUSCULOSKELETAL:  Examination is difficult to perform secondary to patient pain. She has weakness with supraspinatus and infraspinatus testing. Positive impingement. Positive O'Brien's.  Positive AC tenderness to palpation.  Imaging: MRI is reviewed and demonstrates a near full thickness supraspinatous tear. There is a SLAP tear. Significant edema in the distal clavicle. Type II acromion.   Assessment: LEFT SHOULDER ROTATOR CUFF TEAR  Plan: Plan for Procedure(s): SHOULDER ARTHROSCOPY WITH ROTATOR CUFF  REPAIR,SUBACROMIAL DECOMPRESSION, DISTAL CLAVICLECTOMY, BICEPS TENODESIS  The risks benefits and alternatives were discussed with the patient including but not limited to the risks of nonoperative treatment, versus surgical intervention including infection, bleeding, nerve injury,  blood clots, cardiopulmonary complications, morbidity, mortality, among others, and they were willing to proceed.   The patient acknowledged the explanation, agreed to proceed with the plan and consent was signed.   Operative Plan: Left shoulder scope with rotator cuff repair, biceps tenodesis, distal clavicle excision and subacromial decompression  Discharge Medications: standard DVT Prophylaxis: none Physical Therapy: outpatient PT Special Discharge needs: Sling. IceMan   Vernetta Honey, PA-C  09/30/2022 4:13 PM

## 2022-10-02 ENCOUNTER — Encounter (HOSPITAL_BASED_OUTPATIENT_CLINIC_OR_DEPARTMENT_OTHER)
Admission: RE | Disposition: A | Discharge status: Home / Self Care | Payer: Self-pay | Source: Home / Self Care | Attending: Orthopaedic Surgery

## 2022-10-02 ENCOUNTER — Encounter (HOSPITAL_BASED_OUTPATIENT_CLINIC_OR_DEPARTMENT_OTHER): Payer: Self-pay | Admitting: Orthopaedic Surgery

## 2022-10-02 ENCOUNTER — Ambulatory Visit (HOSPITAL_BASED_OUTPATIENT_CLINIC_OR_DEPARTMENT_OTHER): Payer: 59 | Admitting: Certified Registered Nurse Anesthetist

## 2022-10-02 ENCOUNTER — Ambulatory Visit (HOSPITAL_BASED_OUTPATIENT_CLINIC_OR_DEPARTMENT_OTHER)
Admission: RE | Admit: 2022-10-02 | Discharge: 2022-10-02 | Disposition: A | Discharge status: Home / Self Care | Payer: 59 | Attending: Orthopaedic Surgery | Admitting: Orthopaedic Surgery

## 2022-10-02 ENCOUNTER — Other Ambulatory Visit: Payer: Self-pay

## 2022-10-02 DIAGNOSIS — N189 Chronic kidney disease, unspecified: Secondary | ICD-10-CM | POA: Diagnosis not present

## 2022-10-02 DIAGNOSIS — Z5986 Financial insecurity: Secondary | ICD-10-CM | POA: Insufficient documentation

## 2022-10-02 DIAGNOSIS — M25812 Other specified joint disorders, left shoulder: Secondary | ICD-10-CM | POA: Insufficient documentation

## 2022-10-02 DIAGNOSIS — E1122 Type 2 diabetes mellitus with diabetic chronic kidney disease: Secondary | ICD-10-CM | POA: Insufficient documentation

## 2022-10-02 DIAGNOSIS — G4733 Obstructive sleep apnea (adult) (pediatric): Secondary | ICD-10-CM | POA: Diagnosis not present

## 2022-10-02 DIAGNOSIS — S46012A Strain of muscle(s) and tendon(s) of the rotator cuff of left shoulder, initial encounter: Secondary | ICD-10-CM | POA: Diagnosis present

## 2022-10-02 DIAGNOSIS — M75102 Unspecified rotator cuff tear or rupture of left shoulder, not specified as traumatic: Secondary | ICD-10-CM

## 2022-10-02 DIAGNOSIS — K219 Gastro-esophageal reflux disease without esophagitis: Secondary | ICD-10-CM | POA: Diagnosis not present

## 2022-10-02 DIAGNOSIS — X58XXXA Exposure to other specified factors, initial encounter: Secondary | ICD-10-CM | POA: Insufficient documentation

## 2022-10-02 DIAGNOSIS — G4730 Sleep apnea, unspecified: Secondary | ICD-10-CM | POA: Diagnosis not present

## 2022-10-02 DIAGNOSIS — I129 Hypertensive chronic kidney disease with stage 1 through stage 4 chronic kidney disease, or unspecified chronic kidney disease: Secondary | ICD-10-CM | POA: Insufficient documentation

## 2022-10-02 DIAGNOSIS — M19012 Primary osteoarthritis, left shoulder: Secondary | ICD-10-CM | POA: Insufficient documentation

## 2022-10-02 DIAGNOSIS — Z01818 Encounter for other preprocedural examination: Secondary | ICD-10-CM

## 2022-10-02 DIAGNOSIS — Z7984 Long term (current) use of oral hypoglycemic drugs: Secondary | ICD-10-CM | POA: Insufficient documentation

## 2022-10-02 DIAGNOSIS — E785 Hyperlipidemia, unspecified: Secondary | ICD-10-CM | POA: Diagnosis not present

## 2022-10-02 DIAGNOSIS — M4802 Spinal stenosis, cervical region: Secondary | ICD-10-CM

## 2022-10-02 HISTORY — PX: SHOULDER ARTHROSCOPY WITH ROTATOR CUFF REPAIR AND SUBACROMIAL DECOMPRESSION: SHX5686

## 2022-10-02 LAB — GLUCOSE, CAPILLARY
Glucose-Capillary: 129 mg/dL — ABNORMAL HIGH (ref 70–99)
Glucose-Capillary: 115 mg/dL — ABNORMAL HIGH (ref 70–99)

## 2022-10-02 SURGERY — SHOULDER ARTHROSCOPY WITH ROTATOR CUFF REPAIR AND SUBACROMIAL DECOMPRESSION
Anesthesia: Regional | Site: Shoulder | Laterality: Left

## 2022-10-02 MED ORDER — FENTANYL CITRATE (PF) 100 MCG/2ML IJ SOLN
INTRAMUSCULAR | Status: AC
  Filled 2022-10-02: qty 2

## 2022-10-02 MED ORDER — ONDANSETRON HCL 4 MG/2ML IJ SOLN
INTRAMUSCULAR | Status: AC
  Filled 2022-10-02: qty 2

## 2022-10-02 MED ORDER — ONDANSETRON HCL 4 MG PO TABS: 4.0000 mg | ORAL_TABLET | Freq: Three times a day (TID) | ORAL | 0 refills | Status: AC | PRN

## 2022-10-02 MED ORDER — CEFAZOLIN SODIUM-DEXTROSE 2-4 GM/100ML-% IV SOLN
INTRAVENOUS | Status: AC
  Filled 2022-10-02: qty 100

## 2022-10-02 MED ORDER — BUPIVACAINE HCL (PF) 0.5 % IJ SOLN
INTRAMUSCULAR | Status: DC | PRN
Start: 2022-10-02 — End: 2022-10-02
  Administered 2022-10-02: 10 mL via PERINEURAL

## 2022-10-02 MED ORDER — DEXAMETHASONE SODIUM PHOSPHATE 10 MG/ML IJ SOLN
INTRAMUSCULAR | Status: AC
  Filled 2022-10-02: qty 1

## 2022-10-02 MED ORDER — ACETAMINOPHEN 500 MG PO TABS: 1000.0000 mg | ORAL_TABLET | Freq: Three times a day (TID) | ORAL | 0 refills | Status: AC

## 2022-10-02 MED ORDER — OXYCODONE HCL 5 MG PO TABS
ORAL_TABLET | ORAL | 0 refills | Status: AC
Start: 2022-10-02 — End: 2022-10-05

## 2022-10-02 MED ORDER — FENTANYL CITRATE (PF) 100 MCG/2ML IJ SOLN
25.0000 ug | INTRAMUSCULAR | Status: DC | PRN
  Administered 2022-10-02 (×3): 50 ug via INTRAVENOUS

## 2022-10-02 MED ORDER — ROCURONIUM BROMIDE 100 MG/10ML IV SOLN
INTRAVENOUS | Status: DC | PRN
  Administered 2022-10-02: 70 mg via INTRAVENOUS

## 2022-10-02 MED ORDER — MIDAZOLAM HCL 2 MG/2ML IJ SOLN
INTRAMUSCULAR | Status: AC
  Filled 2022-10-02: qty 2

## 2022-10-02 MED ORDER — DEXAMETHASONE SODIUM PHOSPHATE 4 MG/ML IJ SOLN
INTRAMUSCULAR | Status: DC | PRN
  Administered 2022-10-02: 5 mg via INTRAVENOUS

## 2022-10-02 MED ORDER — LIDOCAINE HCL (CARDIAC) PF 100 MG/5ML IV SOSY
PREFILLED_SYRINGE | INTRAVENOUS | Status: DC | PRN
  Administered 2022-10-02: 60 mg via INTRAVENOUS

## 2022-10-02 MED ORDER — BUPIVACAINE LIPOSOME 1.3 % IJ SUSP
INTRAMUSCULAR | Status: DC | PRN
Start: 2022-10-02 — End: 2022-10-02
  Administered 2022-10-02: 10 mL via PERINEURAL

## 2022-10-02 MED ORDER — OXYCODONE HCL 5 MG PO TABS
5.0000 mg | ORAL_TABLET | Freq: Once | ORAL | Status: AC | PRN
  Administered 2022-10-02: 5 mg via ORAL

## 2022-10-02 MED ORDER — ROCURONIUM BROMIDE 10 MG/ML (PF) SYRINGE
PREFILLED_SYRINGE | INTRAVENOUS | Status: AC
  Filled 2022-10-02: qty 10

## 2022-10-02 MED ORDER — FENTANYL CITRATE (PF) 100 MCG/2ML IJ SOLN
100.0000 ug | Freq: Once | INTRAMUSCULAR | Status: AC
  Administered 2022-10-02: 100 ug via INTRAVENOUS

## 2022-10-02 MED ORDER — CELECOXIB 200 MG PO CAPS: 200.0000 mg | ORAL_CAPSULE | Freq: Two times a day (BID) | ORAL | 0 refills | Status: AC

## 2022-10-02 MED ORDER — ACETAMINOPHEN 500 MG PO TABS
1000.0000 mg | ORAL_TABLET | Freq: Once | ORAL | Status: AC
  Administered 2022-10-02: 1000 mg via ORAL

## 2022-10-02 MED ORDER — DROPERIDOL 2.5 MG/ML IJ SOLN: 0.6250 mg | Freq: Once | INTRAMUSCULAR | Status: DC | PRN

## 2022-10-02 MED ORDER — CEFAZOLIN IN SODIUM CHLORIDE 3-0.9 GM/100ML-% IV SOLN
3.0000 g | INTRAVENOUS | Status: AC
  Administered 2022-10-02: 3 g via INTRAVENOUS

## 2022-10-02 MED ORDER — OXYCODONE HCL 5 MG/5ML PO SOLN: 5.0000 mg | Freq: Once | ORAL | Status: AC | PRN

## 2022-10-02 MED ORDER — ONDANSETRON HCL 4 MG/2ML IJ SOLN
INTRAMUSCULAR | Status: DC | PRN
  Administered 2022-10-02: 4 mg via INTRAVENOUS

## 2022-10-02 MED ORDER — SUGAMMADEX SODIUM 200 MG/2ML IV SOLN
INTRAVENOUS | Status: DC | PRN
Start: 2022-10-02 — End: 2022-10-02
  Administered 2022-10-02: 250 mg via INTRAVENOUS

## 2022-10-02 MED ORDER — PROPOFOL 10 MG/ML IV BOLUS
INTRAVENOUS | Status: AC
  Filled 2022-10-02: qty 20

## 2022-10-02 MED ORDER — OXYCODONE HCL 5 MG PO TABS
ORAL_TABLET | ORAL | Status: AC
  Filled 2022-10-02: qty 1

## 2022-10-02 MED ORDER — MIDAZOLAM HCL 2 MG/2ML IJ SOLN
2.0000 mg | Freq: Once | INTRAMUSCULAR | Status: AC
  Administered 2022-10-02: 2 mg via INTRAVENOUS

## 2022-10-02 MED ORDER — LACTATED RINGERS IV SOLN: INTRAVENOUS | Status: DC

## 2022-10-02 MED ORDER — PROPOFOL 10 MG/ML IV BOLUS
INTRAVENOUS | Status: DC | PRN
  Administered 2022-10-02: 200 mg via INTRAVENOUS

## 2022-10-02 MED ORDER — DEXMEDETOMIDINE HCL IN NACL 80 MCG/20ML IV SOLN
INTRAVENOUS | Status: DC | PRN
Start: 2022-10-02 — End: 2022-10-02
  Administered 2022-10-02: 8 ug via INTRAVENOUS

## 2022-10-02 MED ORDER — ACETAMINOPHEN 500 MG PO TABS
ORAL_TABLET | ORAL | Status: AC
  Filled 2022-10-02: qty 2

## 2022-10-02 MED ORDER — SODIUM CHLORIDE 0.9 % IR SOLN
Status: DC | PRN
  Administered 2022-10-02: 6000 mL

## 2022-10-02 SURGICAL SUPPLY — 57 items
AID PSTN UNV HD RSTRNT DISP (MISCELLANEOUS) ×1
ANCH SUT 2 FBRTK KNTLS 1.8 (Anchor) ×2 IMPLANT
ANCH SUT SWLK 19.1X4.75 (Anchor) ×1 IMPLANT
ANCHOR SUT 1.8 FIBERTAK SB KL (Anchor) IMPLANT
ANCHOR SUT BIO SW 4.75X19.1 (Anchor) IMPLANT
APL PRP STRL LF DISP 70% ISPRP (MISCELLANEOUS) ×1
BLADE EXCALIBUR 4.0X13 (MISCELLANEOUS) ×1 IMPLANT
BURR OVAL 8 FLU 4.0X13 (MISCELLANEOUS) ×2 IMPLANT
CANNULA 5.75X71 LONG (CANNULA) IMPLANT
CANNULA PASSPORT 5 (CANNULA) IMPLANT
CANNULA PASSPORT BUTTON 10-40 (CANNULA) IMPLANT
CANNULA TWIST IN 8.25X7CM (CANNULA) IMPLANT
CHLORAPREP W/TINT 26 (MISCELLANEOUS) ×2 IMPLANT
CLSR STERI-STRIP ANTIMIC 1/2X4 (GAUZE/BANDAGES/DRESSINGS) ×1 IMPLANT
COOLER ICEMAN CLASSIC (MISCELLANEOUS) ×1 IMPLANT
DRAPE IMP U-DRAPE 54X76 (DRAPES) ×2 IMPLANT
DRAPE INCISE IOBAN 66X45 STRL (DRAPES) IMPLANT
DRAPE SHOULDER BEACH CHAIR (DRAPES) ×1 IMPLANT
DW OUTFLOW CASSETTE/TUBE SET (MISCELLANEOUS) ×2 IMPLANT
GAUZE PAD ABD 8X10 STRL (GAUZE/BANDAGES/DRESSINGS) ×2 IMPLANT
GAUZE SPONGE 4X4 12PLY STRL (GAUZE/BANDAGES/DRESSINGS) ×1 IMPLANT
GLOVE BIO SURGEON STRL SZ 6.5 (GLOVE) ×2 IMPLANT
GLOVE BIOGEL PI IND STRL 6.5 (GLOVE) ×1 IMPLANT
GLOVE BIOGEL PI IND STRL 8 (GLOVE) ×2 IMPLANT
GLOVE ECLIPSE 8.0 STRL XLNG CF (GLOVE) ×2 IMPLANT
GOWN STRL REUS W/ TWL LRG LVL3 (GOWN DISPOSABLE) ×4 IMPLANT
GOWN STRL REUS W/TWL LRG LVL3 (GOWN DISPOSABLE) ×2
GOWN STRL REUS W/TWL XL LVL3 (GOWN DISPOSABLE) ×1 IMPLANT
KIT SHOULDER STAB MARCO (KITS) ×2 IMPLANT
KIT STR SPEAR 1.8 FBRTK DISP (KITS) IMPLANT
LASSO 90 CVE QUICKPAS (DISPOSABLE) IMPLANT
LASSO CRESCENT QUICKPASS (SUTURE) IMPLANT
MANIFOLD NEPTUNE II (INSTRUMENTS) ×1 IMPLANT
NDL HD SCORPION MEGA LOADER (NEEDLE) IMPLANT
NDL SAFETY ECLIP 18X1.5 (MISCELLANEOUS) ×1 IMPLANT
PACK ARTHROSCOPY DSU (CUSTOM PROCEDURE TRAY) ×2 IMPLANT
PACK BASIN DAY SURGERY FS (CUSTOM PROCEDURE TRAY) ×2 IMPLANT
PAD COLD SHLDR WRAP-ON (PAD) ×2 IMPLANT
PAD ORTHO SHOULDER 7X19 LRG (SOFTGOODS) ×2 IMPLANT
RESTRAINT HEAD UNIVERSAL NS (MISCELLANEOUS) ×2 IMPLANT
SHEET MEDIUM DRAPE 40X70 STRL (DRAPES) ×2 IMPLANT
SLEEVE SCD COMPRESS KNEE MED (STOCKING) ×2 IMPLANT
SLING ARM FOAM STRAP LRG (SOFTGOODS) IMPLANT
SUT FIBERWIRE #2 38 T-5 BLUE (SUTURE)
SUT MNCRL AB 4-0 PS2 18 (SUTURE) ×1 IMPLANT
SUT PDS AB 0 CT 36 (SUTURE) IMPLANT
SUT PDS AB 1 CT 36 (SUTURE) IMPLANT
SUT TIGER TAPE 7 IN WHITE (SUTURE) IMPLANT
SUTURE FIBERWR #2 38 T-5 BLUE (SUTURE) IMPLANT
SUTURE TAPE TIGERLINK 1.3MM BL (SUTURE) IMPLANT
SUTURETAPE TIGERLINK 1.3MM BL (SUTURE)
SYR 5ML LL (SYRINGE) ×1 IMPLANT
TAPE FIBER 2MM 7IN #2 BLUE (SUTURE) IMPLANT
TOWEL GREEN STERILE FF (TOWEL DISPOSABLE) ×4 IMPLANT
TUBE CONNECTING 20X1/4 (TUBING) ×1 IMPLANT
TUBING ARTHROSCOPY IRRIG 16FT (MISCELLANEOUS) ×1 IMPLANT
WAND ABLATOR APOLLO I90 (BUR) ×1 IMPLANT

## 2022-10-02 NOTE — Progress Notes (Signed)
Assisted Dr. Jana Half with left, interscalene , ultrasound guided block. Side rails up, monitors on throughout procedure. See vital signs in flow sheet. Tolerated Procedure well.

## 2022-10-02 NOTE — Anesthesia Preprocedure Evaluation (Signed)
Anesthesia Evaluation  Patient identified by MRN, date of birth, ID band Patient awake    Reviewed: Allergy & Precautions, NPO status , Patient's Chart, lab work & pertinent test results  History of Anesthesia Complications (+) PONV and history of anesthetic complications  Airway Mallampati: II  TM Distance: >3 FB Neck ROM: Full    Dental  (+) Partial Upper   Pulmonary sleep apnea and Continuous Positive Airway Pressure Ventilation    Pulmonary exam normal        Cardiovascular hypertension, Pt. on medications and Pt. on home beta blockers  Rhythm:Regular Rate:Normal     Neuro/Psych  Headaches   Depression       GI/Hepatic ,GERD  Medicated,,(+) Hepatitis -, C  Endo/Other  diabetes, Type 2, Oral Hypoglycemic Agents Hyperthyroidism   Renal/GU CRFRenal disease  negative genitourinary   Musculoskeletal  (+) Arthritis , Osteoarthritis,    Abdominal Normal abdominal exam  (+)   Peds  Hematology   Anesthesia Other Findings   Reproductive/Obstetrics                             Anesthesia Physical Anesthesia Plan  ASA: 3  Anesthesia Plan: General and Regional   Post-op Pain Management: Tylenol PO (pre-op)*   Induction: Intravenous  PONV Risk Score and Plan: 4 or greater and Ondansetron, Dexamethasone, Midazolam, Treatment may vary due to age or medical condition and Droperidol  Airway Management Planned: Mask and Oral ETT  Additional Equipment: None  Intra-op Plan:   Post-operative Plan: Extubation in OR  Informed Consent: I have reviewed the patients History and Physical, chart, labs and discussed the procedure including the risks, benefits and alternatives for the proposed anesthesia with the patient or authorized representative who has indicated his/her understanding and acceptance.     Dental advisory given  Plan Discussed with: CRNA  Anesthesia Plan Comments: (Lab Results       Component                Value               Date                      WBC                      4.6                 05/03/2022                HGB                      14.5                05/03/2022                HCT                      42.5                05/03/2022                MCV                      87.1                05/03/2022  PLT                      203                 05/03/2022           )       Anesthesia Quick Evaluation

## 2022-10-02 NOTE — Anesthesia Procedure Notes (Signed)
Anesthesia Regional Block: Interscalene brachial plexus block   Pre-Anesthetic Checklist: , timeout performed,  Correct Patient, Correct Site, Correct Laterality,  Correct Procedure, Correct Position, site marked,  Risks and benefits discussed,  Surgical consent,  Pre-op evaluation,  At surgeon's request and post-op pain management  Laterality: Left  Prep: Dura Prep       Needles:  Injection technique: Single-shot  Needle Type: Echogenic Stimulator Needle     Needle Length: 5cm  Needle Gauge: 20     Additional Needles:   Procedures:,,,, ultrasound used (permanent image in chart),,    Narrative:  Start time: 10/02/2022 9:36 AM End time: 10/02/2022 9:40 AM Injection made incrementally with aspirations every 5 mL.  Performed by: Personally  Anesthesiologist: Atilano Median, DO  Additional Notes: Patient identified. Risks/Benefits/Options discussed with patient including but not limited to bleeding, infection, nerve damage, failed block, incomplete pain control. Patient expressed understanding and wished to proceed. All questions were answered. Sterile technique was used throughout the entire procedure. Please see nursing notes for vital signs. Aspirated in 5cc intervals with injection for negative confirmation. Patient was given instructions on fall risk and not to get out of bed. All questions and concerns addressed with instructions to call with any issues or inadequate analgesia.

## 2022-10-02 NOTE — Anesthesia Procedure Notes (Addendum)
Procedure Name: Intubation Date/Time: 10/02/2022 10:03 AM  Performed by: Cleda Clarks, CRNAPre-anesthesia Checklist: Patient identified, Emergency Drugs available, Suction available and Patient being monitored Patient Re-evaluated:Patient Re-evaluated prior to induction Oxygen Delivery Method: Circle system utilized Preoxygenation: Pre-oxygenation with 100% oxygen Induction Type: IV induction Ventilation: Mask ventilation without difficulty and Oral airway inserted - appropriate to patient size Laryngoscope Size: Glidescope and 3 Tube type: Oral Tube size: 7.0 mm Number of attempts: 1 Airway Equipment and Method: Stylet and Video-laryngoscopy Placement Confirmation: ETT inserted through vocal cords under direct vision, positive ETCO2 and breath sounds checked- equal and bilateral Secured at: 22 cm Tube secured with: Tape Dental Injury: Teeth and Oropharynx as per pre-operative assessment  Difficulty Due To: Difficulty was anticipated, Difficult Airway- due to large tongue and Difficult Airway- due to reduced neck mobility Future Recommendations: Recommend- induction with short-acting agent, and alternative techniques readily available

## 2022-10-02 NOTE — Interval H&P Note (Signed)
All questions answered, patient wants to proceed with procedure. ? ?

## 2022-10-02 NOTE — Anesthesia Postprocedure Evaluation (Signed)
Anesthesia Post Note  Patient: LITTIE HASSETT  Procedure(s) Performed: SHOULDER ARTHROSCOPY WITH ROTATOR CUFF REPAIR,SUBACROMIAL DECOMPRESSION, DISTAL CLAVICLECTOMY, BICEPS TENODESIS (Left: Shoulder)     Patient location during evaluation: PACU Anesthesia Type: Regional and General Level of consciousness: awake and alert Pain management: pain level controlled Vital Signs Assessment: post-procedure vital signs reviewed and stable Respiratory status: spontaneous breathing, nonlabored ventilation, respiratory function stable and patient connected to nasal cannula oxygen Cardiovascular status: blood pressure returned to baseline and stable Postop Assessment: no apparent nausea or vomiting Anesthetic complications: yes   Encounter Notable Events  Notable Event Outcome Phase Comment  Difficult to intubate - expected  Intraprocedure Filed from anesthesia note documentation.    Last Vitals:  Vitals:   10/02/22 1141 10/02/22 1145  BP:  (!) 161/59  Pulse: 66 66  Resp: 14 14  Temp:    SpO2: 93% 96%    Last Pain:  Vitals:   10/02/22 1145  TempSrc:   PainSc: 5                  Arlys Scatena P Leeman Johnsey

## 2022-10-02 NOTE — Transfer of Care (Signed)
Immediate Anesthesia Transfer of Care Note  Patient: Tracy Daugherty  Procedure(s) Performed: SHOULDER ARTHROSCOPY WITH ROTATOR CUFF REPAIR,SUBACROMIAL DECOMPRESSION, DISTAL CLAVICLECTOMY, BICEPS TENODESIS (Left: Shoulder)  Patient Location: PACU  Anesthesia Type:GA combined with regional for post-op pain  Level of Consciousness: awake, alert , and oriented  Airway & Oxygen Therapy: Patient Spontanous Breathing and Patient connected to face mask oxygen  Post-op Assessment: Report given to RN and Post -op Vital signs reviewed and stable  Post vital signs: Reviewed and stable  Last Vitals:  Vitals Value Taken Time  BP 151/78 10/02/22 1104  Temp 36.1 C 10/02/22 1104  Pulse 65 10/02/22 1105  Resp 24 10/02/22 1105  SpO2 96 % 10/02/22 1105  Vitals shown include unfiled device data.  Last Pain:  Vitals:   10/02/22 0832  TempSrc: Oral  PainSc: 7       Patients Stated Pain Goal: 5 (10/02/22 7829)  Complications:  Encounter Notable Events  Notable Event Outcome Phase Comment  Difficult to intubate - expected  Intraprocedure Filed from anesthesia note documentation.

## 2022-10-03 ENCOUNTER — Encounter (HOSPITAL_BASED_OUTPATIENT_CLINIC_OR_DEPARTMENT_OTHER): Payer: Self-pay | Admitting: Orthopaedic Surgery

## 2022-10-03 NOTE — Op Note (Signed)
Orthopaedic Surgery Operative Note (CSN: [id number])  Tracy Daugherty  Jan 15, 1961 Date of Surgery: 10/02/2022   DIAGNOSES: Left shoulder, acute on chronic rotator cuff tear, SLAP tear, biceps tendinitis, AC arthritis, and subacromial impingement.  POST-OPERATIVE DIAGNOSIS: same  PROCEDURE: Arthroscopic extensive debridement - 29823 Subdeltoid Bursa, Supraspinatus Tendon, Anterior Labrum, Superior Labrum, Posterior Labrum, and glenoid bone, glenoid cartilage, humeral bone, humeral cartilage Arthroscopic distal clavicle excision - 40981 Arthroscopic subacromial decompression - 19147 Arthroscopic rotator cuff repair - 82956 Arthroscopic biceps tenodesis - 21308   OPERATIVE FINDING: Exam under anesthesia: Normal Articular space:  Anterior and posterior labral tearing was noted, SLAP tear noted. Chondral surfaces: Normal Biceps:  Type II SLAP tear Subscapularis: Incomplete tear upper 30% subscapularis was torn Supraspinatus: Incomplete tear partial-thickness bursal sided tear noted, debrided back, 85 to 90% total cuff was intact. Infraspinatus: Intact    Post-operative plan: The patient will be non-weightbearing in a sling 6 weeks.  The patient will be discharged home.  DVT prophylaxis not indicated in ambulatory upper extremity patient without known risk factors.   Pain control with PRN pain medication preferring oral medicines.  Follow up plan will be scheduled in approximately 7 days for incision check and XR.  Surgeons:Primary: Bjorn Pippin, MD Assistants:Caroline McBane PA-C Location: [facility] OR ROOM 6 Anesthesia: General with Exparel interscalene block Antibiotics: Ancef 3 g Tourniquet time: None Estimated Blood Loss: Minimal Complications: None Specimens: None Implants: Implant Name Type Inv. Item Serial No. Manufacturer Lot No. LRB No. Used Action  [facility] SUT SWLK 19.1X4.75 - [id number] Anchor ANCH SUT SWLK 19.1X4.75  [employer] [id number] Left 1 Implanted  ANCH SUT 2 FBRTK  KNTLS 1.8 - [id number] Anchor ANCH SUT 2 FBRTK KNTLS 1.8  [employer] [id number] Left 1 Implanted  ANCH SUT 2 FBRTK KNTLS 1.8 - [id number] Anchor ANCH SUT 2 FBRTK KNTLS 1.8  [employer] [id number] Left 1 Implanted    Indications for Surgery:   Tracy Daugherty is a 62 y.o. female with continued shoulder pain refractory to nonoperative measures for extended period of time.    The risks and benefits were explained at length including but not limited to continued pain, cuff failure, biceps tenodesis failure, stiffness, need for further surgery and infection.   Procedure:   Patient was correctly identified in the preoperative holding area and operative site marked.  Patient brought to OR and positioned beachchair on an Udell table ensuring that all bony prominences were padded and the head was in an appropriate location.  Anesthesia was induced and the operative shoulder was prepped and draped in the usual sterile fashion.  Timeout was called preincision.  A standard posterior viewing portal was made after localizing the portal with a spinal needle.  An anterior accessory portal was also made.  After clearing the articular space the camera was positioned in the subacromial space.  Findings above.    Extensive debridement was performed of the anterior interval tissue, labral fraying and the bursa.  Glenoid bone, glenoid cartilage, humeral bone were all debrided.  Subacromial decompression: We made a lateral portal with spinal needle guidance. We then proceeded to debride bursal tissue extensively with a shaver and arthrocare device. At that point we continued to identify the borders of the acromion and identify the spur. We then carefully preserved the deltoid fascia and used a burr to convert the acromion to a Type 1 flat acromion without issue.  Subscapularis Repair: We identified a subscapularis tear that involved upper 30%.  Using a grasping device  were able to demonstrate that the tendon could be  reapproximated to the lesser tuberosity.  We felt that it was repairable.  We then used a RF ablator to open the rotator interval and released the MGHL to allow further translation of the subscapularis.  This point we cleared both anterior and posterior to the tendon taking care to avoid inferior migration to avoid the neurovascular structures as well as the muscular cutaneous nerve.  We then used a lasso to pass a fiber tape in a mattress fashion through the subscapularis and based 4.75 swivel lock was used to repair back to the lesser tuberosity after prepping the tuberosity extensively.  We had good approximation of the tendon and a recreation of the rolled border.   Biceps tenodesis: We marked the tendon and then performed a tenotomy and debridement of the stump in the articular space. We then identified the biceps tendon in its groove suprapec with the arthroscope in the lateral portal taking care to move from lateral to medial to avoid injury to the subscapularis. At that point we unroofed the tendon itself and mobilized it. An accessory anterior portal was made in line with the tendon and we grasped it from the anterior superior portal and worked from the accessory anterior portal. Two Fibertak 1.49mm knotless anchors were placed in the groove and the tendon was secured in a luggage loop style fashion with a pass of the limb of suture through the tendon using a scorpion device to avoid pull-through.  Repair was completed with good tension on the tendon.  Residual stump of the tendon was removed after being resected with a RF ablator.  Distal Clavicle resection:  The scope was placed in the subacromial space from the posterior portal.  A hemostat was placed through the anterior portal and we spread at the [facility] joint.  A burr was then inserted and 10 mm of distal clavicle was resected taking care to avoid damage to the capsule around the joint and avoiding overhanging bone posteriorly.    The incisions were  closed with absorbable monocryl and steri strips.  A sterile dressing was placed along with a sling. The patient was awoken from general anesthesia and taken to the PACU in stable condition without complication.   Alfonse Alpers, PA-C, present and scrubbed throughout the case, critical for completion in a timely fashion, and for retraction, instrumentation, closure.

## 2023-05-14 ENCOUNTER — Other Ambulatory Visit: Payer: Self-pay | Admitting: Orthopaedic Surgery

## 2023-05-14 DIAGNOSIS — M12812 Other specific arthropathies, not elsewhere classified, left shoulder: Secondary | ICD-10-CM

## 2023-05-15 ENCOUNTER — Ambulatory Visit
Admission: RE | Admit: 2023-05-15 | Discharge: 2023-05-15 | Disposition: A | Discharge status: Home / Self Care | Source: Ambulatory Visit | Attending: Orthopaedic Surgery | Admitting: Orthopaedic Surgery

## 2023-05-15 DIAGNOSIS — M12812 Other specific arthropathies, not elsewhere classified, left shoulder: Secondary | ICD-10-CM

## 2023-06-05 ENCOUNTER — Other Ambulatory Visit: Payer: Self-pay | Admitting: Orthopaedic Surgery

## 2023-06-05 DIAGNOSIS — M75100 Unspecified rotator cuff tear or rupture of unspecified shoulder, not specified as traumatic: Secondary | ICD-10-CM

## 2023-06-11 ENCOUNTER — Ambulatory Visit
Admission: RE | Admit: 2023-06-11 | Discharge: 2023-06-11 | Disposition: A | Discharge status: Home / Self Care | Source: Ambulatory Visit | Attending: Orthopaedic Surgery | Admitting: Orthopaedic Surgery

## 2023-06-11 DIAGNOSIS — M75100 Unspecified rotator cuff tear or rupture of unspecified shoulder, not specified as traumatic: Secondary | ICD-10-CM

## 2023-08-31 NOTE — Progress Notes (Addendum)
 Anesthesia Review:  PCP: Franky Molt LOV 09/01/23  Cardiologist : Leotis Clover- Davis- LOV 05/19/23  Neuro- Dein- LOV 06/16/23  Endocrinologist- Patel - LOV 08/31/23   PPM/ ICD: Device Orders: Rep Notified:  Chest x-ray : EKG :requested from cardiolgoy on 09/03/23 [facility]  Echo : Stress test: Cardiac Cath :   Activity level: can do a flight of stairs without difficuty  Sleep Study/ CPAP : has cpap  Fasting Blood Sugar :      / Checks Blood Sugar -- times a day:     DM- type 2- checks glucose daily in am  Hgba1c-  08/31/23- 6.3  Ozempic - last dose on 09/02/23 Lantus insulin  - Take 1/2 dose in the pm before surgery  Metformin- none am of surgery   Blood Thinner/ Instructions /Last Dose: ASA / Instructions/ Last Dose :  81 mg aspirin   Labs in epic done 8/11 and 09/01/23- include CMP, CBC/diff and hgba1c

## 2023-08-31 NOTE — Patient Instructions (Addendum)
 SURGICAL WAITING ROOM VISITATION  Patients having surgery or a procedure may have no more than 2 support people in the waiting area - these visitors may rotate.    Children under the age of 5 must have an adult with them who is not the patient.  Visitors with respiratory illnesses are discouraged from visiting and should remain at home.  If the patient needs to stay at the hospital during part of their recovery, the visitor guidelines for inpatient rooms apply. Pre-op nurse will coordinate an appropriate time for 1 support person to accompany patient in pre-op.  This support person may not rotate.    Please refer to the [facility] website for the visitor guidelines for Inpatients (after your surgery is over and you are in a regular room).       Your procedure is scheduled on:  09/16/23    Report to [facility] Main Entrance    Report to admitting at  0730 AM   Call this number if you have problems the morning of surgery [phone]   Do not eat food :After Midnight.   After Midnight you may have the following liquids until ___0700___ AM DAY OF SURGERY  Water Non-Citrus Juices (without pulp, NO RED-Apple, White grape, White cranberry) Black Coffee (NO MILK/CREAM OR CREAMERS, sugar ok)  Clear Tea (NO MILK/CREAM OR CREAMERS, sugar ok) regular and decaf                             Plain Jell-O (NO RED)                                           Fruit ices (not with fruit pulp, NO RED)                                     Popsicles (NO RED)                                                               Sports drinks like Gatorade (NO RED)                           If you have questions, please contact your surgeon's office.       Oral Hygiene is also important to reduce your risk of infection.                                    Remember - BRUSH YOUR TEETH THE MORNING OF SURGERY WITH YOUR REGULAR TOOTHPASTE  DENTURES WILL BE REMOVED PRIOR TO SURGERY PLEASE DO NOT  APPLY Poly grip OR ADHESIVES!!!   Do NOT smoke after Midnight   Stop all vitamins and herbal supplements 7 days before surgery.   Take these medicines the morning of surgery with A SIP OF WATER:  nebivolol , inhalers as usual and bring, amlodipine if needed, nexium flonase  if needed    Semaglutide -  last dose on  Basaglar-  Metformin- none day of surgery   DO NOT TAKE ANY ORAL DIABETIC MEDICATIONS DAY OF YOUR SURGERY  Bring CPAP mask and tubing day of surgery.                              You may not have any metal on your body including hair pins, jewelry, and body piercing             Do not wear make-up, lotions, powders, perfumes/cologne, or deodorant  Do not wear nail polish including gel and S&S, artificial/acrylic nails, or any other type of covering on natural nails including finger and toenails. If you have artificial nails, gel coating, etc. that needs to be removed by a nail salon please have this removed prior to surgery or surgery may need to be canceled/ delayed if the surgeon/ anesthesia feels like they are unable to be safely monitored.   Do not shave  48 hours prior to surgery.               Men may shave face and neck.   Do not bring valuables to the hospital. [facility] IS NOT             RESPONSIBLE   FOR VALUABLES.   Contacts, glasses, dentures or bridgework may not be worn into surgery.   Bring small overnight bag day of surgery.   DO NOT BRING YOUR HOME MEDICATIONS TO THE HOSPITAL. PHARMACY WILL DISPENSE MEDICATIONS LISTED ON YOUR MEDICATION LIST TO YOU DURING YOUR ADMISSION IN THE HOSPITAL!    Patients discharged on the day of surgery will not be allowed to drive home.  Someone NEEDS to stay with you for the first 24 hours after anesthesia.   Special Instructions: Bring a copy of your healthcare power of attorney and living will documents the day of surgery if you haven't scanned them before.              Please read over the following fact sheets you  were given: IF YOU HAVE QUESTIONS ABOUT YOUR PRE-OP INSTRUCTIONS PLEASE CALL [phone].   If you received a COVID test during your pre-op visit  it is requested that you wear a mask when out in public, stay away from anyone that may not be feeling well and notify your surgeon if you develop symptoms. If you test positive for Covid or have been in contact with anyone that has tested positive in the last 10 days please notify you surgeon.      Pre-operative 5 CHG Bath Instructions   You can play a key role in reducing the risk of infection after surgery. Your skin needs to be as free of germs as possible. You can reduce the number of germs on your skin by washing with CHG (chlorhexidine  gluconate) soap before surgery. CHG is an antiseptic soap that kills germs and continues to kill germs even after washing.   DO NOT use if you have an allergy to chlorhexidine /CHG or antibacterial soaps. If your skin becomes reddened or irritated, stop using the CHG and notify one of our RNs at [phone].   Please shower with the CHG soap starting 4 days before surgery using the following schedule:     Please keep in mind the following:  DO NOT shave, including legs and underarms, starting the day of your first shower.   You may shave your face at any point before/day of surgery.  Place clean  sheets on your bed the day you start using CHG soap. Use a clean washcloth (not used since being washed) for each shower. DO NOT sleep with pets once you start using the CHG.   CHG Shower Instructions:  If you choose to wash your hair and private area, wash first with your normal shampoo/soap.  After you use shampoo/soap, rinse your hair and body thoroughly to remove shampoo/soap residue.  Turn the water OFF and apply about 3 tablespoons (45 ml) of CHG soap to a CLEAN washcloth.  Apply CHG soap ONLY FROM YOUR NECK DOWN TO YOUR TOES (washing for 3-5 minutes)  DO NOT use CHG soap on face, private areas, open wounds, or  sores.  Pay special attention to the area where your surgery is being performed.  If you are having back surgery, having someone wash your back for you may be helpful. Wait 2 minutes after CHG soap is applied, then you may rinse off the CHG soap.  Pat dry with a clean towel  Put on clean clothes/pajamas   If you choose to wear lotion, please use ONLY the CHG-compatible lotions on the back of this paper.     Additional instructions for the day of surgery: DO NOT APPLY any lotions, deodorants, cologne, or perfumes.   Put on clean/comfortable clothes.  Brush your teeth.  Ask your nurse before applying any prescription medications to the skin.      CHG Compatible Lotions   Aveeno Moisturizing lotion  Cetaphil Moisturizing Cream  Cetaphil Moisturizing Lotion  Clairol Herbal Essence Moisturizing Lotion, Dry Skin  Clairol Herbal Essence Moisturizing Lotion, Extra Dry Skin  Clairol Herbal Essence Moisturizing Lotion, Normal Skin  Curel Age Defying Therapeutic Moisturizing Lotion with Alpha Hydroxy  Curel Extreme Care Body Lotion  Curel Soothing Hands Moisturizing Hand Lotion  Curel Therapeutic Moisturizing Cream, Fragrance-Free  Curel Therapeutic Moisturizing Lotion, Fragrance-Free  Curel Therapeutic Moisturizing Lotion, Original Formula  Eucerin Daily Replenishing Lotion  Eucerin Dry Skin Therapy Plus Alpha Hydroxy Crme  Eucerin Dry Skin Therapy Plus Alpha Hydroxy Lotion  Eucerin Original Crme  Eucerin Original Lotion  Eucerin Plus Crme Eucerin Plus Lotion  Eucerin TriLipid Replenishing Lotion  Keri Anti-Bacterial Hand Lotion  Keri Deep Conditioning Original Lotion Dry Skin Formula Softly Scented  Keri Deep Conditioning Original Lotion, Fragrance Free Sensitive Skin Formula  Keri Lotion Fast Absorbing Fragrance Free Sensitive Skin Formula  Keri Lotion Fast Absorbing Softly Scented Dry Skin Formula  Keri Original Lotion  Keri Skin Renewal Lotion Keri Silky Smooth Lotion  Keri  Silky Smooth Sensitive Skin Lotion  Nivea Body Creamy Conditioning Oil  Nivea Body Extra Enriched Lotion  Nivea Body Original Lotion  Nivea Body Sheer Moisturizing Lotion Nivea Crme  Nivea Skin Firming Lotion  NutraDerm 30 Skin Lotion  NutraDerm Skin Lotion  NutraDerm Therapeutic Skin Cream  NutraDerm Therapeutic Skin Lotion  ProShield Protective Hand Cream  Provon moisturizing lotion   [place]- Preparing for Total Shoulder Arthroplasty    Before surgery, you can play an important role. Because skin is not sterile, your skin needs to be as free of germs as possible. You can reduce the number of germs on your skin by using the following products. Benzoyl Peroxide Gel Reduces the number of germs present on the skin Applied twice a day to shoulder area starting two days before surgery    ==================================================================  Please follow these instructions carefully:  BENZOYL PEROXIDE 5% GEL  Please do not use if you have an allergy to benzoyl  peroxide.   If your skin becomes reddened/irritated stop using the benzoyl peroxide.  Starting two days before surgery, apply as follows: Apply benzoyl peroxide in the morning and at night. Apply after taking a shower. If you are not taking a shower clean entire shoulder front, back, and side along with the armpit with a clean wet washcloth.  Place a quarter-sized dollop on your shoulder and rub in thoroughly, making sure to cover the front, back, and side of your shoulder, along with the armpit.   2 days before ____ AM   ____ PM              1 day before ____ AM   ____ PM                         Do this twice a day for two days.  (Last application is the night before surgery, AFTER using the CHG soap as described below).  Do NOT apply benzoyl peroxide gel on the day of surgery.

## 2023-09-03 ENCOUNTER — Encounter (HOSPITAL_COMMUNITY): Payer: Self-pay

## 2023-09-03 ENCOUNTER — Encounter (HOSPITAL_COMMUNITY)
Admission: RE | Admit: 2023-09-03 | Discharge: 2023-09-03 | Disposition: A | Discharge status: Home / Self Care | Source: Ambulatory Visit | Attending: Orthopaedic Surgery | Admitting: Orthopaedic Surgery

## 2023-09-03 ENCOUNTER — Other Ambulatory Visit: Payer: Self-pay

## 2023-09-03 VITALS — BP 145/85 | HR 68 | Temp 99.1°F | Resp 16 | Ht 67.0 in | Wt 273.0 lb

## 2023-09-03 DIAGNOSIS — Z01812 Encounter for preprocedural laboratory examination: Secondary | ICD-10-CM | POA: Diagnosis not present

## 2023-09-03 DIAGNOSIS — Z01818 Encounter for other preprocedural examination: Secondary | ICD-10-CM | POA: Diagnosis present

## 2023-09-03 HISTORY — DX: Anxiety disorder, unspecified: F41.9

## 2023-09-03 HISTORY — DX: Fibromyalgia: M79.7

## 2023-09-03 HISTORY — DX: Cardiac arrhythmia, unspecified: I49.9

## 2023-09-03 HISTORY — DX: Dyspnea, unspecified: R06.00

## 2023-09-03 LAB — GLUCOSE, CAPILLARY: Glucose-Capillary: 109 mg/dL — ABNORMAL HIGH (ref 70–99)

## 2023-09-03 LAB — SURGICAL PCR SCREEN
MRSA, PCR: NEGATIVE
Staphylococcus aureus: NEGATIVE

## 2023-09-09 NOTE — Anesthesia Preprocedure Evaluation (Addendum)
 Anesthesia Evaluation  Patient identified by MRN, date of birth, ID band Patient awake    Reviewed: Allergy & Precautions, NPO status , Patient's Chart, lab work & pertinent test results  History of Anesthesia Complications (+) PONV and history of anesthetic complications  Airway Mallampati: III  TM Distance: >3 FB Neck ROM: Limited    Dental  (+) Dental Advisory Given, Upper Dentures   Pulmonary sleep apnea and Continuous Positive Airway Pressure Ventilation    Pulmonary exam normal        Cardiovascular hypertension, Pt. on medications and Pt. on home beta blockers Normal cardiovascular exam+ dysrhythmias Supra Ventricular Tachycardia      Neuro/Psych  PSYCHIATRIC DISORDERS Anxiety Depression     Neuromuscular disease    GI/Hepatic Neg liver ROS,GERD  Medicated and Controlled,,  Endo/Other  diabetes, Type 2, Oral Hypoglycemic Agents, Insulin  Dependent  Class 3 obesity On GLP-1a   Renal/GU negative Renal ROS     Musculoskeletal  (+) Arthritis ,  Fibromyalgia -  Abdominal  (+) + obese  Peds  Hematology negative hematology ROS (+)   Anesthesia Other Findings   Reproductive/Obstetrics                              Anesthesia Physical Anesthesia Plan  ASA: 3  Anesthesia Plan: General   Post-op Pain Management: Tylenol  PO (pre-op)* and Regional block*   Induction: Intravenous  PONV Risk Score and Plan: 4 or greater and Treatment may vary due to age or medical condition, Ondansetron , Dexamethasone  and Midazolam   Airway Management Planned: Oral ETT and Video Laryngoscope Planned  Additional Equipment: None  Intra-op Plan:   Post-operative Plan: Extubation in OR  Informed Consent: I have reviewed the patients History and Physical, chart, labs and discussed the procedure including the risks, benefits and alternatives for the proposed anesthesia with the patient or authorized  representative who has indicated his/her understanding and acceptance.     Dental advisory given  Plan Discussed with: CRNA and Anesthesiologist  Anesthesia Plan Comments: (See PAT note 09/03/23)         Anesthesia Quick Evaluation

## 2023-09-09 NOTE — Progress Notes (Addendum)
 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 never smoker with h/o PONV, sleep apnea uses CPAP, HTN, DM II (A1C 6.3), left shoulder OA scheduled for above procedure 09/16/2023 with Dr. Bonner Cristy.   S/p cervical fusion C3-C7.   S/p left shoulder arthroscopy 10/02/2022. Per anesthesia note, Difficulty Due To: Difficulty was anticipated, Difficult Airway- due to large tongue and Difficult Airway- due to reduced neck mobility Future Recommendations: Recommend- induction with short-acting agent, and alternative techniques readily available  Pt follows with cardio for management of PSVT. Last seen 05/19/2023. Per notes SVT stable on bystolic . HTN stable. No changes made at this visit. 6 month follow up recommended.   Pt reports she can climb a flight of stairs without difficulty.  Denies chest pain or shortness of breath.   Pt advised to hold Ozempic  1 week prior to surgery.  VS: BP (!) 145/85   Pulse 68   Temp 37.3 C (Oral)   Resp 16   Ht 5' 7 (1.702 m)   Wt 123.8 kg   LMP  (LMP Unknown)   SpO2 100%   BMI 42.76 kg/m   PROVIDERS: Joshua Franky Sharper, PA-C is PCP    LABS: Labs reviewed: Acceptable for surgery. (all labs ordered are listed, but only abnormal results are displayed)  Labs Reviewed  GLUCOSE, CAPILLARY - Abnormal; Notable for the following components:      Result Value   Glucose-Capillary 109 (*)    All other components within normal limits  SURGICAL PCR SCREEN     IMAGES:   EKG:   CV: PET Cardiac Perfusion 04/16/21 ([facility] Everywhere) 1.  No evidence of ischemia. Small area of fixed defect at the distal anteroseptal region likely an  artifact.  2.  Normal coronary artery reserve.  3.  No wall motion abnormalities.  4.  Ejection fraction is within normal limits and measures greater than 70%.   Echo 03/29/2021 ([facility] Every[facility]) SUMMARY  Previous study not available.  Image Quality: Technically difficult.  There is mild concentric left ventricular hypertrophy with normal wall  motion, normal systolic function and ejection fraction '60-65%'.  Left ventricular filling pattern is prolonged relaxation.  The right atrium is mildly dilated.  There is mild tricuspid regurgitation.  Estimated right ventricular systolic pressure is 22 mmHg.  Past Medical History:  Diagnosis Date   Anxiety    Arthritis    knees, ankles   Bronchitis    uses inhaler prn   Chronic pain    Depression    Diabetes mellitus without complication (HCC)    type 2   Dyspnea    Dysrhythmia    hx of SVT   Fibromyalgia    GERD (gastroesophageal reflux disease)    HLD (hyperlipidemia)    Hypertension    PONV (postoperative nausea and vomiting)    Seasonal allergies    Sleep apnea    uses cpap nightly   Wears glasses    Wears partial dentures    upper    Past Surgical History:  Procedure Laterality Date   ABDOMINAL HYSTERECTOMY     ANTERIOR (CYSTOCELE) AND POSTERIOR REPAIR (RECTOCELE) WITH XENFORM GRAFT AND SACROSPINOUS FIXATION  2013  ANTERIOR CERVICAL DECOMPRESSION/DISCECTOMY FUSION 4 LEVELS N/A 04/02/2020   Procedure: CERVICAL THREE-FOUR, CERVICAL FOUR-FIVE, CERVICAL FIVE-SIX, CERVICAL SIX-SEVEN ANTERIOR CERVICAL DECOMPRESSION/DISCECTOMY FUSION;  Surgeon: Onetha Kuba, MD;  Location: [facility] OR;  Service: Neurosurgery;  Laterality: N/A;   CHOLECYSTECTOMY     COLONOSCOPY     right sided transverse abdominas plane block under ultrasound guidance  2018   SHOULDER ARTHROSCOPY Right 12/15/2021   SCA   SHOULDER ARTHROSCOPY WITH ROTATOR CUFF REPAIR AND SUBACROMIAL DECOMPRESSION Left 10/02/2022   Procedure: SHOULDER ARTHROSCOPY WITH ROTATOR CUFF  REPAIR,SUBACROMIAL DECOMPRESSION, DISTAL CLAVICLECTOMY, BICEPS TENODESIS;  Surgeon: Cristy Bonner DASEN, MD;  Location: [facility];  Service: Orthopedics;  Laterality: Left;   TUBAL LIGATION     UPPER GI ENDOSCOPY     WISDOM TOOTH EXTRACTION  1980s    MEDICATIONS:  albuterol  (VENTOLIN  HFA) 108 (90 Base) MCG/ACT inhaler   allopurinol (ZYLOPRIM) 300 MG tablet   amLODipine (NORVASC) 10 MG tablet   aspirin EC 81 MG tablet   buPROPion  (WELLBUTRIN  SR) 150 MG 12 hr tablet   celecoxib  (CELEBREX ) 200 MG capsule   diclofenac (VOLTAREN) 75 MG EC tablet   esomeprazole (NEXIUM) 40 MG capsule   fluticasone  (FLONASE ) 50 MCG/ACT nasal spray   HYDROcodone -acetaminophen  (NORCO) 7.5-325 MG tablet   Insulin  Glargine (BASAGLAR KWIKPEN) 100 UNIT/ML   linaclotide (LINZESS) 145 MCG CAPS capsule   losartan  (COZAAR ) 100 MG tablet   metFORMIN (GLUCOPHAGE-XR) 750 MG 24 hr tablet   methocarbamol  (ROBAXIN ) 500 MG tablet   Nebivolol  HCl 20 MG TABS   OZEMPIC , 1 MG/DOSE, 4 MG/3ML SOPN   pregabalin (LYRICA) 75 MG capsule   promethazine  (PHENERGAN ) 12.5 MG tablet   rOPINIRole  (REQUIP ) 0.25 MG tablet   rosuvastatin  (CRESTOR ) 40 MG tablet   tiZANidine  (ZANAFLEX ) 4 MG tablet   VITAMIN D  PO   No current facility-administered medications for this encounter.     Harlene Hoots Ward, PA-C [facility] [phone]

## 2023-09-15 NOTE — H&P (Signed)
 PREOPERATIVE H&P  Chief Complaint: osteoarthritis of left shoulder  HPI: Tracy Daugherty is a 63 y.o. female who is scheduled for Procedure(s): ARTHROPLASTY, SHOULDER, TOTAL, REVERSE.   Patient has a past medical history significant for PONV, sleep apnea uses CPAP, HTN, DM II (A1C 6.3) .   Patient had a rotator cuff repair in 2024. She continued to have trouble and loss of range of motion.   Symptoms are rated as moderate to severe, and have been worsening.  This is significantly impairing activities of daily living.    Please see clinic note for further details on this patient's care.    She has elected for surgical management.   Past Medical History:  Diagnosis Date   Anxiety    Arthritis    knees, ankles   Bronchitis    uses inhaler prn   Chronic pain    Depression    Diabetes mellitus without complication (HCC)    type 2   Dyspnea    Dysrhythmia    hx of SVT   Fibromyalgia    GERD (gastroesophageal reflux disease)    HLD (hyperlipidemia)    Hypertension    PONV (postoperative nausea and vomiting)    Seasonal allergies    Sleep apnea    uses cpap nightly   Wears glasses    Wears partial dentures    upper   Past Surgical History:  Procedure Laterality Date   ABDOMINAL HYSTERECTOMY     ANTERIOR (CYSTOCELE) AND POSTERIOR REPAIR (RECTOCELE) WITH XENFORM GRAFT AND SACROSPINOUS FIXATION  2013   ANTERIOR CERVICAL DECOMPRESSION/DISCECTOMY FUSION 4 LEVELS N/A 04/02/2020   Procedure: CERVICAL THREE-FOUR, CERVICAL FOUR-FIVE, CERVICAL FIVE-SIX, CERVICAL SIX-SEVEN ANTERIOR CERVICAL DECOMPRESSION/DISCECTOMY FUSION;  Surgeon: Onetha Kuba, MD;  Location: [facility] OR;  Service: Neurosurgery;  Laterality: N/A;   CHOLECYSTECTOMY     COLONOSCOPY     right sided transverse abdominas plane block under ultrasound guidance  2018   SHOULDER ARTHROSCOPY Right 12/15/2021   SCA   SHOULDER ARTHROSCOPY WITH ROTATOR CUFF REPAIR AND SUBACROMIAL DECOMPRESSION Left 10/02/2022   Procedure:  SHOULDER ARTHROSCOPY WITH ROTATOR CUFF REPAIR,SUBACROMIAL DECOMPRESSION, DISTAL CLAVICLECTOMY, BICEPS TENODESIS;  Surgeon: Cristy Bonner DASEN, MD;  Location:  [facility];  Service: Orthopedics;  Laterality: Left;   TUBAL LIGATION     UPPER GI ENDOSCOPY     WISDOM TOOTH EXTRACTION  1980s   Social History   Socioeconomic History   Marital status: Single    Spouse name: Not on file   Number of children: Not on file   Years of education: Not on file   Highest education level: Not on file  Occupational History   Not on file  Tobacco Use   Smoking status: Never   Smokeless tobacco: Never  Vaping Use   Vaping status: Never Used  Substance and Sexual Activity   Alcohol use: Never    Comment: rare   Drug use: Never   Sexual activity: Yes    Birth control/protection: Surgical    Comment: Hysterectomy  Other Topics Concern   Not on file  Social History Narrative   Not on file   Social Drivers of Health   Financial Resource Strain: Medium Risk (02/11/2019)   Received from [facility] visits prior to 03/22/2022.   Overall Financial Resource Strain (CARDIA)    Difficulty of Paying Living Expenses: Somewhat hard  Food Insecurity: Low Risk  (05/19/2023)   Received from [facility]   Hunger Vital Sign  Within the past 12 months, you worried that your food would run out before you got money to buy more: Never true    Within the past 12 months, the food you bought just didn't last and you didn't have money to get more: Not on file  Transportation Needs: No Transportation Needs (01/27/2022)   Received from [facility]    In the past 12 months, has lack of reliable transportation kept you from medical appointments, meetings, work or from getting things needed for daily living? : No  Physical Activity: Not on file  Stress: Not on file  Social Connections: Not on file   No family history on file. Allergies  Allergen Reactions   Bee Venom  Dermatitis   Gabapentin Itching   Meloxicam Itching   Tomato Itching   Prior to Admission medications   Medication Sig Start Date End Date Taking? Authorizing Provider  albuterol  (VENTOLIN  HFA) 108 (90 Base) MCG/ACT inhaler Inhale 2 puffs into the lungs every 6 (six) hours as needed for wheezing or shortness of breath.   Yes [provider]  allopurinol (ZYLOPRIM) 300 MG tablet Take 300 mg by mouth daily. 06/10/23  Yes [provider]  amLODipine (NORVASC) 10 MG tablet Take 10 mg by mouth daily as needed (elevated blood pressure.).   Yes [provider]  aspirin  EC 81 MG tablet Take 81 mg by mouth daily. Swallow whole.   Yes [provider]  buPROPion  (WELLBUTRIN  SR) 150 MG 12 hr tablet Take 150 mg by mouth 2 (two) times daily.   Yes [provider]  celecoxib  (CELEBREX ) 200 MG capsule Take 200 mg by mouth every other day. 06/16/23  Yes [provider]  diclofenac (VOLTAREN) 75 MG EC tablet Take 150 mg by mouth in the morning.   Yes [provider]  esomeprazole (NEXIUM) 40 MG capsule Take 40 mg by mouth in the morning.   Yes [provider]  fluticasone  (FLONASE ) 50 MCG/ACT nasal spray Place 2 sprays into both nostrils daily as needed for allergies or rhinitis.   Yes [provider]  HYDROcodone -acetaminophen  (NORCO) 7.5-325 MG tablet Take 1 tablet by mouth every 8 (eight) hours as needed (pain.).   Yes [provider]  Insulin  Glargine (BASAGLAR KWIKPEN) 100 UNIT/ML Inject 50 Units into the skin at bedtime. 08/31/23  Yes [provider]  linaclotide (LINZESS) 145 MCG CAPS capsule Take 145 mcg by mouth daily before breakfast.   Yes [provider]  losartan  (COZAAR ) 100 MG tablet Take 100 mg by mouth in the morning.   Yes [provider]  metFORMIN (GLUCOPHAGE-XR) 750 MG 24 hr tablet Take 1,500 mg by mouth daily with supper.   Yes [provider]  methocarbamol  (ROBAXIN )  500 MG tablet Take 1 tablet (500 mg total) by mouth 4 (four) times daily. Patient taking differently: Take 1,500 mg by mouth at bedtime as needed (spasms). 04/03/20  Yes Meyran, Suzen Lacks, NP  Nebivolol  HCl 20 MG TABS Take 20 mg by mouth in the morning and at bedtime.   Yes [provider]  OZEMPIC , 1 MG/DOSE, 4 MG/3ML SOPN Inject 1 mg into the skin once a week. 06/24/23  Yes [provider]  pregabalin (LYRICA) 75 MG capsule Take 75 mg by mouth 2 (two) times daily.   Yes [provider]  promethazine  (PHENERGAN ) 12.5 MG tablet Take 12.5 mg by mouth every 6 (six) hours as needed for nausea or vomiting.   Yes [provider]  rOPINIRole  (REQUIP ) 0.25 MG tablet Take 0.75 mg by mouth at bedtime as needed (restless leg).   Yes [provider]  rosuvastatin  (CRESTOR ) 40 MG tablet Take 40 mg by mouth in the morning.   Yes [provider]  tiZANidine  (ZANAFLEX ) 4 MG tablet Take 2-4 mg by mouth every 8 (eight) hours as needed for muscle spasms.   Yes [provider]  VITAMIN D  PO Take 1 tablet by mouth in the morning.   Yes [provider]    ROS: All other systems have been reviewed and were otherwise negative with the exception of those mentioned in the HPI and as above.  Physical Exam: General: Alert, no acute distress Cardiovascular: No pedal edema Respiratory: No cyanosis, no use of accessory musculature GI: No organomegaly, abdomen is soft and non-tender Skin: No lesions in the area of chief complaint Neurologic: Sensation intact distally Psychiatric: Patient is competent for consent with normal mood and affect Lymphatic: No axillary or cervical lymphadenopathy  MUSCULOSKELETAL:  Active forward elevation to about 80 degrees.  Internal rotation to beltline.  Imaging: MRI demonstrates a Type II re-tear of the subscapularis.  There is a partial thickness re-tear of the supraspinatus and infraspinatus.     BMI: Estimated body mass index is 42.76 kg/m as calculated from the following:   Height as of 09/03/23: 5' 7 (1.702 m).   Weight as of 09/03/23: 123.8 kg.  Lab Results  Component Value Date   ALBUMIN 4.0 05/01/2022   Diabetes: Patient does not have a diagnosis of diabetes.     Smoking Status:   reports that she has never smoked. She has never used smokeless tobacco.     Assessment: osteoarthritis of left shoulder  Plan: Plan for Procedure(s): ARTHROPLASTY, SHOULDER, TOTAL, REVERSE  The risks benefits and alternatives were discussed with the patient including but not limited to the risks of nonoperative treatment, versus surgical intervention including infection, bleeding, nerve injury,  blood clots, cardiopulmonary complications, morbidity, mortality, among others, and they were willing to proceed.   We additionally specifically discussed risks of axillary nerve injury, infection, periprosthetic fracture, continued pain and longevity of implants prior to beginning procedure.    Patient will be closely monitored in PACU for medical stabilization and pain control. If found stable in PACU, patient may be discharged home with outpatient follow-up. If any concerns regarding patient's stabilization patient will be admitted for observation after surgery. The patient is planning to be discharged home with outpatient PT.   The patient acknowledged the explanation, agreed to proceed with the plan and consent was signed.   Operative Plan: Left reverse total shoulder arthroplasty Discharge Medications: standard DVT Prophylaxis: aspirin  Physical Therapy: outpatient PT Special Discharge needs: Sling (should bring with her). IceMan   Aleck LOISE Stalling, PA-C  09/15/2023 7:37 AM

## 2023-09-16 ENCOUNTER — Ambulatory Visit (HOSPITAL_COMMUNITY)
Admission: RE | Admit: 2023-09-16 | Discharge: 2023-09-16 | Disposition: A | Discharge status: Home / Self Care | Attending: Orthopaedic Surgery | Admitting: Orthopaedic Surgery

## 2023-09-16 ENCOUNTER — Encounter (HOSPITAL_COMMUNITY): Payer: Self-pay | Admitting: Orthopaedic Surgery

## 2023-09-16 ENCOUNTER — Ambulatory Visit (HOSPITAL_COMMUNITY): Admitting: Anesthesiology

## 2023-09-16 ENCOUNTER — Ambulatory Visit (HOSPITAL_COMMUNITY): Payer: Self-pay | Admitting: Medical

## 2023-09-16 ENCOUNTER — Ambulatory Visit (HOSPITAL_COMMUNITY)

## 2023-09-16 ENCOUNTER — Encounter (HOSPITAL_COMMUNITY)
Admission: RE | Disposition: A | Discharge status: Home / Self Care | Payer: Self-pay | Source: Home / Self Care | Attending: Orthopaedic Surgery

## 2023-09-16 ENCOUNTER — Other Ambulatory Visit: Payer: Self-pay

## 2023-09-16 DIAGNOSIS — F32A Depression, unspecified: Secondary | ICD-10-CM | POA: Insufficient documentation

## 2023-09-16 DIAGNOSIS — I10 Essential (primary) hypertension: Secondary | ICD-10-CM | POA: Diagnosis not present

## 2023-09-16 DIAGNOSIS — Z7984 Long term (current) use of oral hypoglycemic drugs: Secondary | ICD-10-CM | POA: Diagnosis not present

## 2023-09-16 DIAGNOSIS — G4730 Sleep apnea, unspecified: Secondary | ICD-10-CM | POA: Diagnosis not present

## 2023-09-16 DIAGNOSIS — K219 Gastro-esophageal reflux disease without esophagitis: Secondary | ICD-10-CM | POA: Insufficient documentation

## 2023-09-16 DIAGNOSIS — Z794 Long term (current) use of insulin: Secondary | ICD-10-CM | POA: Insufficient documentation

## 2023-09-16 DIAGNOSIS — E66813 Obesity, class 3: Secondary | ICD-10-CM | POA: Diagnosis not present

## 2023-09-16 DIAGNOSIS — M797 Fibromyalgia: Secondary | ICD-10-CM | POA: Diagnosis not present

## 2023-09-16 DIAGNOSIS — E119 Type 2 diabetes mellitus without complications: Secondary | ICD-10-CM | POA: Insufficient documentation

## 2023-09-16 DIAGNOSIS — G4733 Obstructive sleep apnea (adult) (pediatric): Secondary | ICD-10-CM

## 2023-09-16 DIAGNOSIS — Z6841 Body Mass Index (BMI) 40.0 and over, adult: Secondary | ICD-10-CM | POA: Insufficient documentation

## 2023-09-16 DIAGNOSIS — I471 Supraventricular tachycardia, unspecified: Secondary | ICD-10-CM | POA: Diagnosis not present

## 2023-09-16 DIAGNOSIS — F419 Anxiety disorder, unspecified: Secondary | ICD-10-CM | POA: Diagnosis not present

## 2023-09-16 DIAGNOSIS — M19012 Primary osteoarthritis, left shoulder: Secondary | ICD-10-CM | POA: Diagnosis present

## 2023-09-16 DIAGNOSIS — F418 Other specified anxiety disorders: Secondary | ICD-10-CM | POA: Diagnosis not present

## 2023-09-16 DIAGNOSIS — Z01818 Encounter for other preprocedural examination: Secondary | ICD-10-CM

## 2023-09-16 DIAGNOSIS — M75122 Complete rotator cuff tear or rupture of left shoulder, not specified as traumatic: Secondary | ICD-10-CM

## 2023-09-16 DIAGNOSIS — M25712 Osteophyte, left shoulder: Secondary | ICD-10-CM | POA: Insufficient documentation

## 2023-09-16 DIAGNOSIS — Z7985 Long-term (current) use of injectable non-insulin antidiabetic drugs: Secondary | ICD-10-CM | POA: Diagnosis not present

## 2023-09-16 LAB — GLUCOSE, CAPILLARY
Glucose-Capillary: 151 mg/dL — ABNORMAL HIGH (ref 70–99)
Glucose-Capillary: 179 mg/dL — ABNORMAL HIGH (ref 70–99)

## 2023-09-16 SURGERY — ARTHROPLASTY, SHOULDER, TOTAL, REVERSE
Anesthesia: General | Site: Shoulder | Laterality: Left

## 2023-09-16 MED ORDER — VANCOMYCIN HCL 1000 MG IV SOLR
INTRAVENOUS | Status: AC
  Filled 2023-09-16: qty 20

## 2023-09-16 MED ORDER — PROPOFOL 10 MG/ML IV BOLUS
INTRAVENOUS | Status: AC
  Filled 2023-09-16: qty 20

## 2023-09-16 MED ORDER — SUGAMMADEX SODIUM 200 MG/2ML IV SOLN
INTRAVENOUS | Status: DC | PRN
Start: 2023-09-16 — End: 2023-09-16
  Administered 2023-09-16 (×2): 200 mg via INTRAVENOUS

## 2023-09-16 MED ORDER — PROPOFOL 10 MG/ML IV BOLUS
INTRAVENOUS | Status: DC | PRN
  Administered 2023-09-16: 200 mg via INTRAVENOUS

## 2023-09-16 MED ORDER — AMISULPRIDE (ANTIEMETIC) 5 MG/2ML IV SOLN
INTRAVENOUS | Status: AC
  Filled 2023-09-16: qty 4

## 2023-09-16 MED ORDER — PROPOFOL 500 MG/50ML IV EMUL
INTRAVENOUS | Status: DC | PRN
Start: 2023-09-16 — End: 2023-09-16
  Administered 2023-09-16: 25 ug/kg/min via INTRAVENOUS

## 2023-09-16 MED ORDER — ASPIRIN EC 81 MG PO TBEC: 81.0000 mg | DELAYED_RELEASE_TABLET | Freq: Two times a day (BID) | ORAL | 0 refills | Status: AC

## 2023-09-16 MED ORDER — OXYCODONE HCL 5 MG PO TABS
5.0000 mg | ORAL_TABLET | Freq: Once | ORAL | Status: AC | PRN
  Administered 2023-09-16: 5 mg via ORAL

## 2023-09-16 MED ORDER — CELECOXIB 200 MG PO CAPS: 200.0000 mg | ORAL_CAPSULE | Freq: Two times a day (BID) | ORAL | 0 refills | Status: AC

## 2023-09-16 MED ORDER — VANCOMYCIN HCL 1 G IV SOLR
INTRAVENOUS | Status: DC | PRN
Start: 2023-09-16 — End: 2023-09-16
  Administered 2023-09-16: 1000 mg via TOPICAL

## 2023-09-16 MED ORDER — EPHEDRINE 5 MG/ML INJ
INTRAVENOUS | Status: AC
  Filled 2023-09-16: qty 10

## 2023-09-16 MED ORDER — BUPIVACAINE LIPOSOME 1.3 % IJ SUSP
INTRAMUSCULAR | Status: DC | PRN
  Administered 2023-09-16: 10 mL via PERINEURAL

## 2023-09-16 MED ORDER — ONDANSETRON HCL 4 MG/2ML IJ SOLN
INTRAMUSCULAR | Status: AC
  Filled 2023-09-16: qty 2

## 2023-09-16 MED ORDER — ONDANSETRON HCL 4 MG PO TABS: 4.0000 mg | ORAL_TABLET | Freq: Three times a day (TID) | ORAL | 0 refills | Status: AC | PRN

## 2023-09-16 MED ORDER — FENTANYL CITRATE (PF) 100 MCG/2ML IJ SOLN
INTRAMUSCULAR | Status: AC
  Filled 2023-09-16: qty 2

## 2023-09-16 MED ORDER — SODIUM CHLORIDE 0.9 % IR SOLN
Status: DC | PRN
  Administered 2023-09-16: 1000 mL

## 2023-09-16 MED ORDER — PHENYLEPHRINE 80 MCG/ML (10ML) SYRINGE FOR IV PUSH (FOR BLOOD PRESSURE SUPPORT)
PREFILLED_SYRINGE | INTRAVENOUS | Status: AC
  Filled 2023-09-16: qty 20

## 2023-09-16 MED ORDER — AMISULPRIDE (ANTIEMETIC) 5 MG/2ML IV SOLN
10.0000 mg | Freq: Once | INTRAVENOUS | Status: AC | PRN
  Administered 2023-09-16: 10 mg via INTRAVENOUS

## 2023-09-16 MED ORDER — CEFAZOLIN SODIUM-DEXTROSE 3-4 GM/150ML-% IV SOLN
3.0000 g | INTRAVENOUS | Status: AC
  Administered 2023-09-16: 3 g via INTRAVENOUS
  Filled 2023-09-16: qty 150

## 2023-09-16 MED ORDER — STERILE WATER FOR IRRIGATION IR SOLN
Status: DC | PRN
  Administered 2023-09-16: 2000 mL

## 2023-09-16 MED ORDER — ORAL CARE MOUTH RINSE: 15.0000 mL | Freq: Once | OROMUCOSAL | Status: AC

## 2023-09-16 MED ORDER — 0.9 % SODIUM CHLORIDE (POUR BTL) OPTIME
TOPICAL | Status: DC | PRN
  Administered 2023-09-16: 1000 mL

## 2023-09-16 MED ORDER — ONDANSETRON HCL 4 MG/2ML IJ SOLN
INTRAMUSCULAR | Status: DC | PRN
  Administered 2023-09-16: 4 mg via INTRAVENOUS

## 2023-09-16 MED ORDER — ROCURONIUM BROMIDE 100 MG/10ML IV SOLN
INTRAVENOUS | Status: DC | PRN
  Administered 2023-09-16: 50 mg via INTRAVENOUS

## 2023-09-16 MED ORDER — OXYCODONE HCL 5 MG PO TABS
ORAL_TABLET | ORAL | Status: AC
  Filled 2023-09-16: qty 1

## 2023-09-16 MED ORDER — LIDOCAINE HCL (CARDIAC) PF 100 MG/5ML IV SOSY
PREFILLED_SYRINGE | INTRAVENOUS | Status: DC | PRN
  Administered 2023-09-16: 40 mg via INTRAVENOUS

## 2023-09-16 MED ORDER — INSULIN ASPART 100 UNIT/ML IJ SOLN
0.0000 [IU] | INTRAMUSCULAR | Status: DC | PRN
  Administered 2023-09-16: 2 [IU] via SUBCUTANEOUS
  Filled 2023-09-16: qty 1

## 2023-09-16 MED ORDER — LACTATED RINGERS IV SOLN: INTRAVENOUS | Status: DC | PRN

## 2023-09-16 MED ORDER — DEXAMETHASONE SODIUM PHOSPHATE 10 MG/ML IJ SOLN
INTRAMUSCULAR | Status: AC
  Filled 2023-09-16: qty 1

## 2023-09-16 MED ORDER — SODIUM CHLORIDE 0.9 % IV SOLN: 12.5000 mg | INTRAVENOUS | Status: DC | PRN

## 2023-09-16 MED ORDER — FENTANYL CITRATE (PF) 100 MCG/2ML IJ SOLN
INTRAMUSCULAR | Status: DC | PRN
  Administered 2023-09-16: 100 ug via INTRAVENOUS

## 2023-09-16 MED ORDER — FENTANYL CITRATE PF 50 MCG/ML IJ SOSY
PREFILLED_SYRINGE | INTRAMUSCULAR | Status: AC
  Filled 2023-09-16: qty 1

## 2023-09-16 MED ORDER — OXYCODONE HCL 5 MG/5ML PO SOLN: 5.0000 mg | Freq: Once | ORAL | Status: AC | PRN

## 2023-09-16 MED ORDER — CHLORHEXIDINE GLUCONATE 0.12 % MT SOLN
15.0000 mL | Freq: Once | OROMUCOSAL | Status: AC
  Administered 2023-09-16: 15 mL via OROMUCOSAL

## 2023-09-16 MED ORDER — FENTANYL CITRATE PF 50 MCG/ML IJ SOSY
PREFILLED_SYRINGE | INTRAMUSCULAR | Status: AC
  Filled 2023-09-16: qty 2

## 2023-09-16 MED ORDER — FENTANYL CITRATE PF 50 MCG/ML IJ SOSY
25.0000 ug | PREFILLED_SYRINGE | INTRAMUSCULAR | Status: DC | PRN
  Administered 2023-09-16 (×4): 50 ug via INTRAVENOUS

## 2023-09-16 MED ORDER — LIDOCAINE HCL (PF) 2 % IJ SOLN
INTRAMUSCULAR | Status: AC
  Filled 2023-09-16: qty 20

## 2023-09-16 MED ORDER — TRANEXAMIC ACID-NACL 1000-0.7 MG/100ML-% IV SOLN
1000.0000 mg | INTRAVENOUS | Status: AC
  Administered 2023-09-16: 1000 mg via INTRAVENOUS
  Filled 2023-09-16: qty 100

## 2023-09-16 MED ORDER — BUPIVACAINE HCL (PF) 0.5 % IJ SOLN
INTRAMUSCULAR | Status: DC | PRN
  Administered 2023-09-16: 15 mL via PERINEURAL

## 2023-09-16 MED ORDER — OXYCODONE HCL 5 MG PO TABS: ORAL_TABLET | ORAL | 0 refills | Status: AC

## 2023-09-16 MED ORDER — MIDAZOLAM HCL 2 MG/2ML IJ SOLN: 1.0000 mg | Freq: Once | INTRAMUSCULAR | Status: DC

## 2023-09-16 MED ORDER — ACETAMINOPHEN 500 MG PO TABS: 1000.0000 mg | ORAL_TABLET | Freq: Once | ORAL | Status: DC

## 2023-09-16 MED ORDER — ROCURONIUM BROMIDE 10 MG/ML (PF) SYRINGE
PREFILLED_SYRINGE | INTRAVENOUS | Status: AC
  Filled 2023-09-16: qty 30

## 2023-09-16 MED ORDER — PHENYLEPHRINE HCL-NACL 20-0.9 MG/250ML-% IV SOLN
INTRAVENOUS | Status: AC
  Filled 2023-09-16: qty 250

## 2023-09-16 MED ORDER — ACETAMINOPHEN 500 MG PO TABS
1000.0000 mg | ORAL_TABLET | Freq: Once | ORAL | Status: AC
  Administered 2023-09-16: 1000 mg via ORAL
  Filled 2023-09-16: qty 2

## 2023-09-16 MED ORDER — ACETAMINOPHEN 500 MG PO TABS
1000.0000 mg | ORAL_TABLET | Freq: Three times a day (TID) | ORAL | 0 refills | Status: AC
Start: 2023-09-16 — End: 2023-09-30

## 2023-09-16 MED ORDER — PHENYLEPHRINE HCL-NACL 20-0.9 MG/250ML-% IV SOLN
INTRAVENOUS | Status: DC | PRN
  Administered 2023-09-16: 50 ug/min via INTRAVENOUS

## 2023-09-16 MED ORDER — LACTATED RINGERS IV SOLN: INTRAVENOUS | Status: DC

## 2023-09-16 MED ORDER — FENTANYL CITRATE PF 50 MCG/ML IJ SOSY
50.0000 ug | PREFILLED_SYRINGE | Freq: Once | INTRAMUSCULAR | Status: AC
  Administered 2023-09-16: 50 ug via INTRAVENOUS
  Filled 2023-09-16: qty 2

## 2023-09-16 SURGICAL SUPPLY — 53 items
AUGMENT BASEPLATE 15DEG 25 WDG (Joint) IMPLANT
BAG COUNTER SPONGE SURGICOUNT (BAG) ×2 IMPLANT
BIT DRILL 3.2 PERIPHERAL SCREW (BIT) IMPLANT
BLADE SAW SGTL 73X25 THK (BLADE) ×1 IMPLANT
CHLORAPREP W/TINT 26 (MISCELLANEOUS) ×4 IMPLANT
CLSR STERI-STRIP ANTIMIC 1/2X4 (GAUZE/BANDAGES/DRESSINGS) ×2 IMPLANT
COOLER ICEMAN CLASSIC (MISCELLANEOUS) ×2 IMPLANT
COVER BACK TABLE 60X90IN (DRAPES) ×2 IMPLANT
COVER SURGICAL LIGHT HANDLE (MISCELLANEOUS) ×1 IMPLANT
CUP HUM SYS INSERT SZ 1/2 36 (Joint) IMPLANT
DRAPE C-ARM 42X120 X-RAY (DRAPES) IMPLANT
DRAPE INCISE IOBAN 66X45 STRL (DRAPES) ×1 IMPLANT
DRAPE POUCH INSTRU U-SHP 10X18 (DRAPES) ×2 IMPLANT
DRAPE SHEET LG 3/4 BI-LAMINATE (DRAPES) ×2 IMPLANT
DRAPE SURG ORHT 6 SPLT 77X108 (DRAPES) ×2 IMPLANT
DRSG AQUACEL AG ADV 3.5X 6 (GAUZE/BANDAGES/DRESSINGS) ×2 IMPLANT
ELECT BLADE TIP CTD 4 INCH (ELECTRODE) ×2 IMPLANT
ELECT PENCIL ROCKER SW 15FT (MISCELLANEOUS) ×2 IMPLANT
ELECT REM PT RETURN 15FT ADLT (MISCELLANEOUS) ×1 IMPLANT
FACESHIELD WRAPAROUND OR TEAM (MASK) ×3 IMPLANT
GLOVE BIO SURGEON STRL SZ 6.5 (GLOVE) ×2 IMPLANT
GLOVE BIOGEL PI IND STRL 6.5 (GLOVE) ×2 IMPLANT
GLOVE BIOGEL PI IND STRL 8 (GLOVE) ×2 IMPLANT
GLOVE ECLIPSE 8.0 STRL XLNG CF (GLOVE) ×4 IMPLANT
GOWN STRL REUS W/ TWL LRG LVL3 (GOWN DISPOSABLE) ×4 IMPLANT
GUIDEWIRE GLENOID 2.5X220 (WIRE) IMPLANT
HEAD CANN REV SHLD STD 36 (Shoulder) IMPLANT
KIT BASIN OR (CUSTOM PROCEDURE TRAY) ×1 IMPLANT
KIT STABILIZATION SHOULDER (MISCELLANEOUS) ×1 IMPLANT
KIT TURNOVER KIT A (KITS) ×2 IMPLANT
MANIFOLD NEPTUNE II (INSTRUMENTS) ×2 IMPLANT
NS IRRIG 1000ML POUR BTL (IV SOLUTION) ×2 IMPLANT
PACK SHOULDER (CUSTOM PROCEDURE TRAY) ×2 IMPLANT
PAD COLD SHLDR WRAP-ON (PAD) ×1 IMPLANT
PIN GUIDE 3X75 SHOULDER (PIN) IMPLANT
RESTRAINT HEAD UNIVERSAL NS (MISCELLANEOUS) ×2 IMPLANT
SCREW 5.5X22 (Screw) IMPLANT
SCREW 5.5X26 (Screw) IMPLANT
SCREW BONE 6.5X40 SM (Screw) IMPLANT
SCREW PERIPHERAL 5.0X34 (Screw) IMPLANT
SET HNDPC FAN SPRY TIP SCT (DISPOSABLE) ×1 IMPLANT
SPONGE T-LAP 4X18 ~~LOC~~+RFID (SPONGE) IMPLANT
STEM HUMERAL STD SHORT SIZE 2 (Orthopedic Implant) IMPLANT
SUCTION TUBE FRAZIER 12FR DISP (SUCTIONS) IMPLANT
SUT ETHIBOND 2 V 37 (SUTURE) ×2 IMPLANT
SUT ETHIBOND NAB CT1 #1 30IN (SUTURE) ×2 IMPLANT
SUT MNCRL AB 4-0 PS2 18 (SUTURE) ×2 IMPLANT
SUT VIC AB 0 CT1 36 (SUTURE) IMPLANT
SUT VIC AB 3-0 SH 27X BRD (SUTURE) ×1 IMPLANT
SUTURE FIBERWR #5 38 CONV NDL (SUTURE) ×4 IMPLANT
TOWEL OR 17X26 10 PK STRL BLUE (TOWEL DISPOSABLE) ×1 IMPLANT
TUBE SUCTION HIGH CAP CLEAR NV (SUCTIONS) ×2 IMPLANT
WATER STERILE IRR 1000ML POUR (IV SOLUTION) ×4 IMPLANT

## 2023-09-16 NOTE — Discharge Instructions (Signed)
 Bonner Hair MD, MPH Aleck Stalling, PA-C [facility] [address] [phone] (tel)   [phone] (fax)   POST-OPERATIVE INSTRUCTIONS - TOTAL SHOULDER REPLACEMENT    WOUND CARE You may leave the operative dressing in place until your follow-up appointment. KEEP THE INCISIONS CLEAN AND DRY. There may be a small amount of fluid/bleeding leaking at the surgical site. This is normal after surgery.  If it fills with liquid or blood please call us  immediately to change it for you. Use the provided ice machine or Ice packs as often as possible for the first 3-4 days, then as needed for pain relief.   Keep a layer of cloth or a shirt between your skin and the cooling unit to prevent frost bite as it can get very cold.  SHOWERING: - You may shower on Post-Op Day #2.  - The dressing is water  resistant but do not scrub it as it may start to peel up.   - You may remove the sling for showering - Gently pat the area dry.  - Do not soak the shoulder in water .  - Do not go swimming in the pool or ocean until your incision has completely healed (about 4-6 weeks after surgery) - KEEP THE INCISIONS CLEAN AND DRY.  EXERCISES Wear the sling at all times  You may remove the sling for showering, but keep the arm across the chest or in a secondary sling.    Accidental/Purposeful External Rotation and shoulder flexion (reaching behind you) is to be avoided at all costs for the first month. It is ok to come out of your sling if your are sitting and have assistance for eating.   Do not lift anything heavier than 1 pound until we discuss it further in clinic.  It is normal for your fingers/hand to become more swollen after surgery and discolored from bruising.   This will resolve over the first few weeks usually after surgery. Please continue to ambulate and do not stay sitting or lying for too long.  Perform foot and wrist pumps to assist in circulation.  PHYSICAL  THERAPY - You will begin physical therapy soon after surgery (unless otherwise specified) - Please call to set up an appointment, if you do not already have one  - Let our office if there are any issues with scheduling your therapy  - You have a physical therapy appointment scheduled at [facility] (across the hall from our office) on 8/29   REGIONAL ANESTHESIA (NERVE BLOCKS) The anesthesia team may have performed a nerve block for you this is a great tool used to minimize pain.   The block may start wearing off overnight (between 8-24 hours postop) When the block wears off, your pain may go from nearly zero to the pain you would have had postop without the block. This is an abrupt transition but nothing dangerous is happening.   This can be a challenging period but utilize your as needed pain medications to try and manage this period. We suggest you use the pain medication the first night prior to going to bed, to ease this transition.  You may take an extra dose of narcotic when this happens if needed   POST-OP MEDICATIONS- Multimodal approach to pain control In general your pain will be controlled with a combination of substances.  Prescriptions unless otherwise discussed are electronically sent to your pharmacy.  This is a carefully made plan we use to minimize narcotic use.  Celebrex  - Anti-inflammatory medication taken on a scheduled basis Acetaminophen  - Non-narcotic pain medicine taken on a scheduled basis  Oxycodone  - This is a strong narcotic, to be used only on an "as needed" basis for SEVERE pain. Aspirin  81mg  - This medicine is used to minimize the risk of blood clots after surgery. Zofran  -  take as needed for nausea   FOLLOW-UP If you develop a Fever (>101.5), Redness or Drainage from the surgical incision site, please call our office to arrange for an evaluation. Please call the office to schedule a follow-up appointment for a wound check, 7-10 days post-operatively.  IF  YOU HAVE ANY QUESTIONS, PLEASE FEEL FREE TO CALL OUR OFFICE.  HELPFUL INFORMATION  Your arm will be in a sling following surgery. You will be in this sling for the next 4 weeks.   You may be more comfortable sleeping in a semi-seated position the first few nights following surgery.  Keep a pillow propped under the elbow and forearm for comfort.  If you have a recliner type of chair it might be beneficial.  If not that is fine too, but it would be helpful to sleep propped up with pillows behind your operated shoulder as well under your elbow and forearm.  This will reduce pulling on the suture lines.  When dressing, put your operative arm in the sleeve first.  When getting undressed, take your operative arm out last.  Loose fitting, button-down shirts are recommended.  In most states it is against the law to drive while your arm is in a sling. And certainly against the law to drive while taking narcotics.  You may return to work/school in the next couple of days when you feel up to it. Desk work and typing in the sling is fine.  We suggest you use the pain medication the first night prior to going to bed, in order to ease any pain when the anesthesia wears off. You should avoid taking pain medications on an empty stomach as it will make you nauseous.  You should wean off your narcotic medicines as soon as you are able.     Most patients will be off narcotics before their first postop appointment.    We do not refill narcotics  Do not drink alcoholic beverages or take illicit drugs when taking pain medications.  Pain medication may make you constipated.  Below are a few solutions to try in this order: Decrease the amount of pain medication if you aren't having pain. Drink lots of decaffeinated fluids. Drink prune juice and/or each dried prunes  If the first 3 don't work start with additional solutions Take Colace - an over-the-counter stool softener Take Senokot - an over-the-counter  laxative Take Miralax - a stronger over-the-counter laxative   Dental Antibiotics:  We require dental prophylaxis for 2 years after a shoulder replacement  Contact your surgeon for an antibiotic prescription, prior to your dental procedure.   For more information including helpful videos and documents visit our website:   [email or web address]

## 2023-09-16 NOTE — Op Note (Signed)
 Orthopaedic Surgery Operative Note (CSN: [id number])  Tracy Daugherty  April 11, 1960 Date of Surgery: 09/16/2023   Diagnoses:  Left irreparable rotator cuff tear  Procedure: Left reverse augmented total shoulder arthroplasty  Operative Finding Successful completion of planned procedure.  Complete retear of the superior cuff as well as the anterior cuff.  Axillary nerve tug test was unable to be performed in the setting of significant subcoracoid scarring  Post-operative plan: The patient will be NWB in sling.  The patient will be will be discharged from PACU if continues to be stable as was plan prior to surgery.  DVT prophylaxis Aspirin  81 mg twice daily for 6 weeks.  Pain control with PRN pain medication preferring oral medicines.  Follow up plan will be scheduled in approximately 7 days for incision check and XR.  Physical therapy to start immediately.  Implants: Tornier perform humeral size 2 standard stem, 55% retentive polyethylene, 36 standard glenosphere with a 25 full wedge baseplate and a 40 center screw with 4 peripheral screws  Post-Op Diagnosis: Same Surgeons:Primary: Cristy Bonner DASEN, MD Assistants:Caroline McBane, PA-C Location: [facility] ROOM 09 Anesthesia: General with Exparel  Interscalene Antibiotics: Ancef  2g preop, Vancomycin  1000mg  locally Tourniquet time: None Estimated Blood Loss: 100 Complications: None Specimens: None Implants: Implant Name Type Inv. Item Serial No. Manufacturer Lot No. LRB No. Used Action  AUGMENT BASEPLATE 15DEG 25 WDG - [id number] Joint AUGMENT BASEPLATE 15DEG 25 WDG [id number] [employer]  Left 1 Implanted  HEAD CANN REV SHLD STD 36 - [id number] Shoulder HEAD CANN REV SHLD STD 36 [id number] [employer]  Left 1 Implanted  SCREW 5.5X22 - [id number] Screw SCREW 5.5X22  [employer]  Left 1 Implanted  SCREW PERIPHERAL 5.0X34 - [id number] Screw SCREW PERIPHERAL 5.0X34  [employer]  Left 1 Implanted  SCREW 5.5X26 - [id number] Screw SCREW 5.5X26  [employer]   Left 1 Implanted  SCREW BONE 6.5X40 SM - [id number] Screw SCREW BONE 6.5X40 SM  [employer]  Left 1 Implanted  CUP HUM SYS INSERT SZ 1/2 36 - [id number] Joint CUP HUM SYS INSERT SZ 1/2 36 [id number] [employer]  Left 1 Implanted  STEM HUMERAL STD SHORT SIZE 2 - [id number] Orthopedic Implant STEM HUMERAL STD SHORT SIZE 2 [id number] [employer]  Left 1 Implanted    Indications for Surgery:   Tracy Daugherty is a 63 y.o. female with irreparable rotator cuff tear.  Benefits and risks of operative and nonoperative management were discussed prior to surgery with patient/guardian(s) and informed consent form was completed.  Infection and need for further surgery were discussed as was prosthetic stability and cuff issues.  We additionally specifically discussed risks of axillary nerve injury, infection, periprosthetic fracture, continued pain and longevity of implants prior to beginning procedure.      Procedure:   The patient was identified in the preoperative holding area where the surgical site was marked. Block placed by anesthesia with exparel .  The patient was taken to the OR where a procedural timeout was called and the above noted anesthesia was induced.  The patient was positioned beachchair on allen table with spider arm positioner.  Preoperative antibiotics were dosed.  The patient's left shoulder was prepped and draped in the usual sterile fashion.  A second preoperative timeout was called.       Standard deltopectoral approach was performed with a #10 blade. We dissected down to the subcutaneous tissues and the cephalic vein was taken laterally with the deltoid. Clavipectoral fascia was incised in line with the  incision. Deep retractors were placed. The long of the biceps tendon was identified and there was significant tenosynovitis present.  Tenodesis was performed to the pectoralis tendon with #2 Ethibond. The remaining biceps was followed up into the rotator interval where it was released.    The subscapularis was taken down in a full thickness layer with capsule along the humeral neck extending inferiorly around the humeral head. We continued releasing the capsule directly off of the osteophytes inferiorly all the way around the corner. This allowed us  to dislocate the humeral head.    The rotator cuff was carefully examined and noted to be irreperably torn.  The decision was confirmed that a reverse total shoulder was indicated for this patient.  There were osteophytes along the inferior humeral neck. The osteophytes were removed with an osteotome and a rongeur.  Osteophytes were removed with a rongeur and an osteotome and the anatomic neck was well visualized.     A humeral cutting guide was used extra medullary with a pin to help control version. The version was set at 20 of retroversion. Humeral osteotomy was performed with an oscillating saw. The head fragment was passed off the back table.  A cut protector plate was placed.  The subscapularis was again identified and immediately we took care to palpate the axillary nerve anteriorly and verify its position with gentle palpation as well as the tug test.  We then released the SGHL with bovie cautery prior to placing a curved mayo at the junction of the anterior glenoid well above the axillary nerve and bluntly dissecting the subscapularis from the capsule.  We then carefully protected the axillary nerve as we gently released the inferior capsule to fully mobilize the subscapularis.  An anterior deltoid retractor was then placed as well as a small Hohmann retractor superiorly.  The glenoid was relatively intact in the setting of an irreparable cuff tear  The remaining labrum was removed circumferentially taking great care not to disrupt the posterior capsule.   At this point we felt based on blueprint templating that a full wedge augment was necessary.  We began by using a full wedge guide to place our center pin as was templated.   We had good position of this pin and we proceeded with our starter center drill.  This allowed for us  to use the 15 degree full wedge reamer obtaining circumferential witness marks and good bone preparation for ingrowth.  At this point we proceeded with our center drill and had an intact vault.  We then drilled our center screw to a length of 40 mm.    We selected a 6.5 mm x 40 mm screw and the full wedge baseplate which was placed in the same orientation as our reaming.  We double checked that we had good apposition of the base plate to bone and then proceeded to place 3 locking screws and one nonlocking screw as is typical.   Next a 36 mm glenosphere was selected and impacted onto the baseplate. The center screw was tightened.  We turned attention back to the humeral side. The cut protector was removed.  We used the perform humeral sizing block to select the appropriate size which for this patient was a 2.  We then placed our center pin and reamed over it concentrically obtaining appropriate inset.  We then used our lateralizing chisel to prepare the lateral aspect of the humerus.  At that point we selected the appropriate implant trialing a 2.  Using this  trial implant we trialed multiple polyethylene sizes settling on a 0 which provided good stability and range of motion without excess soft tissue tension. The offset was dialed in to match the normal anatomy. The shoulder was trialed.  There was good ROM in all planes and the shoulder was stable with no inferior translation.  The real humeral implants were opened after again confirming sizes.  The trial was removed. #5 Fiberwire x4 sutures passed through the humeral neck for subscap repair. The humeral component was press-fit obtaining a secure fit. The joint was reduced and thoroughly irrigated with pulsatile lavage. Subscap was repaired back with #5 Fiberwire sutures through bone tunnels. Hemostasis was obtained. The deltopectoral interval was  reapproximated with #1 Ethibond. The subcutaneous tissues were closed with 2-0 Vicryl and the skin was closed with running monocryl.    The wounds were cleaned and dried and an Aquacel dressing was placed. The drapes taken down. The arm was placed into sling with abduction pillow. Patient was awakened, extubated, and transferred to the recovery room in stable condition. There were no intraoperative complications. The sponge, needle, and attention counts were  correct at the end of the case.     Aleck Stalling, PA-C, present and scrubbed throughout the case, critical for completion in a timely fashion, and for retraction, instrumentation, closure.

## 2023-09-16 NOTE — Anesthesia Postprocedure Evaluation (Signed)
 Anesthesia Post Note  Patient: Tracy Daugherty  Procedure(s) Performed: ARTHROPLASTY, SHOULDER, TOTAL, REVERSE (Left: Shoulder)     Patient location during evaluation: PACU Anesthesia Type: General Level of consciousness: awake and alert Pain management: pain level controlled Vital Signs Assessment: post-procedure vital signs reviewed and stable Respiratory status: spontaneous breathing, nonlabored ventilation and respiratory function stable Cardiovascular status: stable and blood pressure returned to baseline Anesthetic complications: no   No notable events documented.  Last Vitals:  Vitals:   09/16/23 1400 09/16/23 1420  BP: (!) 155/84   Pulse: 73 77  Resp: 17 19  Temp:    SpO2: 94% 93%                  Debby FORBES Like

## 2023-09-16 NOTE — Transfer of Care (Signed)
 Immediate Anesthesia Transfer of Care Note  Patient: Tracy Daugherty  Procedure(s) Performed: ARTHROPLASTY, SHOULDER, TOTAL, REVERSE (Left: Shoulder)  Patient Location: PACU  Anesthesia Type:General  Level of Consciousness: awake and alert   Airway & Oxygen Therapy: Patient Spontanous Breathing and Patient connected to nasal cannula oxygen  Post-op Assessment: Report given to RN and Post -op Vital signs reviewed and stable  Post vital signs: Reviewed and stable  Last Vitals:  Vitals Value Taken Time  BP    Temp    Pulse    Resp    SpO2      Last Pain:  Vitals:   09/16/23 1004  TempSrc:   PainSc: 7          Complications: No notable events documented.

## 2023-09-16 NOTE — Interval H&P Note (Signed)
 All questions answered, patient wants to proceed with procedure. ? ?

## 2023-09-16 NOTE — Anesthesia Procedure Notes (Signed)
 Procedure Name: Intubation Date/Time: 09/16/2023 11:13 AM  Performed by: Dartha Meckel, CRNAPre-anesthesia Checklist: Patient identified, Emergency Drugs available, Suction available and Patient being monitored Patient Re-evaluated:Patient Re-evaluated prior to induction Oxygen Delivery Method: Circle system utilized Preoxygenation: Pre-oxygenation with 100% oxygen Induction Type: IV induction Ventilation: Mask ventilation without difficulty Laryngoscope Size: Glidescope and 3 Grade View: Grade I Tube type: Oral Tube size: 7.0 mm Number of attempts: 1 Airway Equipment and Method: Stylet and Oral airway Placement Confirmation: ETT inserted through vocal cords under direct vision, positive ETCO2 and breath sounds checked- equal and bilateral Secured at: 21 cm Tube secured with: Tape Dental Injury: Teeth and Oropharynx as per pre-operative assessment

## 2023-09-16 NOTE — Anesthesia Procedure Notes (Signed)
 Anesthesia Regional Block: Interscalene brachial plexus block   Pre-Anesthetic Checklist: , timeout performed,  Correct Patient, Correct Site, Correct Laterality,  Correct Procedure, Correct Position, site marked,  Risks and benefits discussed,  Surgical consent,  Pre-op evaluation,  At surgeon's request and post-op pain management  Laterality: Left  Prep: chloraprep       Needles:  Injection technique: Single-shot  Needle Type: Echogenic Needle     Needle Length: 5cm  Needle Gauge: 21     Additional Needles:   Narrative:  Start time: 09/16/2023 10:39 AM End time: 09/16/2023 10:42 AM Injection made incrementally with aspirations every 5 mL.  Performed by: Personally  Anesthesiologist: Lucious Debby BRAVO, MD  Additional Notes: No pain on injection. No increased resistance to injection. Injection made in 5cc increments. Good needle visualization. Patient tolerated the procedure well.

## 2023-09-17 ENCOUNTER — Encounter (HOSPITAL_COMMUNITY): Payer: Self-pay | Admitting: Orthopaedic Surgery
